# Patient Record
Sex: Male | Born: 1940 | Race: White | Hispanic: No | Marital: Married | Smoking: Former smoker
Health system: Southern US, Community
[De-identification: ages and names within clinical notes are randomized; demographics above are authoritative.]

## PROBLEM LIST (undated history)

## (undated) DIAGNOSIS — K5790 Diverticulosis of intestine, part unspecified, without perforation or abscess without bleeding: Secondary | ICD-10-CM

## (undated) DIAGNOSIS — E119 Type 2 diabetes mellitus without complications: Secondary | ICD-10-CM

## (undated) DIAGNOSIS — I10 Essential (primary) hypertension: Secondary | ICD-10-CM

## (undated) DIAGNOSIS — I779 Disorder of arteries and arterioles, unspecified: Secondary | ICD-10-CM

## (undated) DIAGNOSIS — I2510 Atherosclerotic heart disease of native coronary artery without angina pectoris: Secondary | ICD-10-CM

## (undated) DIAGNOSIS — R519 Headache, unspecified: Secondary | ICD-10-CM

## (undated) DIAGNOSIS — I739 Peripheral vascular disease, unspecified: Secondary | ICD-10-CM

## (undated) DIAGNOSIS — N529 Male erectile dysfunction, unspecified: Secondary | ICD-10-CM

## (undated) DIAGNOSIS — D369 Benign neoplasm, unspecified site: Secondary | ICD-10-CM

## (undated) DIAGNOSIS — F17201 Nicotine dependence, unspecified, in remission: Secondary | ICD-10-CM

## (undated) DIAGNOSIS — R51 Headache: Secondary | ICD-10-CM

## (undated) DIAGNOSIS — G4700 Insomnia, unspecified: Secondary | ICD-10-CM

## (undated) DIAGNOSIS — M479 Spondylosis, unspecified: Secondary | ICD-10-CM

## (undated) DIAGNOSIS — I714 Abdominal aortic aneurysm, without rupture, unspecified: Secondary | ICD-10-CM

## (undated) DIAGNOSIS — I679 Cerebrovascular disease, unspecified: Secondary | ICD-10-CM

## (undated) DIAGNOSIS — I6529 Occlusion and stenosis of unspecified carotid artery: Secondary | ICD-10-CM

## (undated) DIAGNOSIS — G629 Polyneuropathy, unspecified: Secondary | ICD-10-CM

## (undated) DIAGNOSIS — E785 Hyperlipidemia, unspecified: Secondary | ICD-10-CM

## (undated) DIAGNOSIS — I639 Cerebral infarction, unspecified: Secondary | ICD-10-CM

## (undated) HISTORY — DX: Atherosclerotic heart disease of native coronary artery without angina pectoris: I25.10

## (undated) HISTORY — DX: Cerebrovascular disease, unspecified: I67.9

## (undated) HISTORY — DX: Type 2 diabetes mellitus without complications: E11.9

## (undated) HISTORY — DX: Polyneuropathy, unspecified: G62.9

## (undated) HISTORY — DX: Peripheral vascular disease, unspecified: I73.9

## (undated) HISTORY — DX: Disorder of arteries and arterioles, unspecified: I77.9

## (undated) HISTORY — DX: Hyperlipidemia, unspecified: E78.5

## (undated) HISTORY — DX: Insomnia, unspecified: G47.00

## (undated) HISTORY — DX: Male erectile dysfunction, unspecified: N52.9

## (undated) HISTORY — DX: Headache: R51

## (undated) HISTORY — DX: Occlusion and stenosis of unspecified carotid artery: I65.29

## (undated) HISTORY — DX: Nicotine dependence, unspecified, in remission: F17.201

## (undated) HISTORY — DX: Abdominal aortic aneurysm, without rupture: I71.4

## (undated) HISTORY — DX: Benign neoplasm, unspecified site: D36.9

## (undated) HISTORY — DX: Abdominal aortic aneurysm, without rupture, unspecified: I71.40

## (undated) HISTORY — DX: Essential (primary) hypertension: I10

## (undated) HISTORY — DX: Cerebral infarction, unspecified: I63.9

## (undated) HISTORY — DX: Diverticulosis of intestine, part unspecified, without perforation or abscess without bleeding: K57.90

## (undated) HISTORY — DX: Headache, unspecified: R51.9

## (undated) HISTORY — DX: Spondylosis, unspecified: M47.9

---

## 1983-05-15 HISTORY — PX: LUMBAR SPINE SURGERY: SHX701

## 2002-03-11 ENCOUNTER — Encounter: Payer: Self-pay | Admitting: *Deleted

## 2002-03-11 ENCOUNTER — Emergency Department (HOSPITAL_COMMUNITY): Admission: EM | Admit: 2002-03-11 | Discharge: 2002-03-11 | Payer: Self-pay | Admitting: Internal Medicine

## 2003-05-15 HISTORY — PX: ANTERIOR FUSION CERVICAL SPINE: SUR626

## 2003-05-16 ENCOUNTER — Ambulatory Visit (HOSPITAL_COMMUNITY): Admission: RE | Admit: 2003-05-16 | Discharge: 2003-05-16 | Payer: Self-pay | Admitting: Neurology

## 2003-06-28 ENCOUNTER — Ambulatory Visit (HOSPITAL_COMMUNITY): Admission: RE | Admit: 2003-06-28 | Discharge: 2003-06-28 | Payer: Self-pay | Admitting: Internal Medicine

## 2003-07-01 ENCOUNTER — Ambulatory Visit (HOSPITAL_COMMUNITY): Admission: RE | Admit: 2003-07-01 | Discharge: 2003-07-01 | Payer: Self-pay | Admitting: Vascular Surgery

## 2003-12-01 ENCOUNTER — Other Ambulatory Visit: Admission: RE | Admit: 2003-12-01 | Discharge: 2003-12-01 | Payer: Self-pay | Admitting: Dermatology

## 2003-12-09 ENCOUNTER — Ambulatory Visit (HOSPITAL_COMMUNITY): Admission: RE | Admit: 2003-12-09 | Discharge: 2003-12-09 | Payer: Self-pay | Admitting: Family Medicine

## 2004-02-21 ENCOUNTER — Ambulatory Visit (HOSPITAL_COMMUNITY): Admission: RE | Admit: 2004-02-21 | Discharge: 2004-02-22 | Payer: Self-pay | Admitting: Neurosurgery

## 2004-05-14 DIAGNOSIS — I2510 Atherosclerotic heart disease of native coronary artery without angina pectoris: Secondary | ICD-10-CM

## 2004-05-14 HISTORY — DX: Atherosclerotic heart disease of native coronary artery without angina pectoris: I25.10

## 2004-05-14 HISTORY — PX: CORONARY ARTERY BYPASS GRAFT: SHX141

## 2004-09-26 ENCOUNTER — Encounter (HOSPITAL_COMMUNITY): Admission: RE | Admit: 2004-09-26 | Discharge: 2004-10-26 | Payer: Self-pay | Admitting: Family Medicine

## 2004-09-29 ENCOUNTER — Ambulatory Visit (HOSPITAL_COMMUNITY): Admission: RE | Admit: 2004-09-29 | Discharge: 2004-09-29 | Payer: Self-pay | Admitting: Family Medicine

## 2004-10-03 ENCOUNTER — Ambulatory Visit: Payer: Self-pay | Admitting: Cardiology

## 2004-10-04 ENCOUNTER — Ambulatory Visit (HOSPITAL_COMMUNITY): Admission: RE | Admit: 2004-10-04 | Discharge: 2004-10-04 | Payer: Self-pay | Admitting: Cardiology

## 2004-10-04 ENCOUNTER — Ambulatory Visit: Payer: Self-pay | Admitting: Cardiology

## 2004-10-10 ENCOUNTER — Ambulatory Visit: Payer: Self-pay | Admitting: Cardiology

## 2004-10-10 ENCOUNTER — Inpatient Hospital Stay (HOSPITAL_BASED_OUTPATIENT_CLINIC_OR_DEPARTMENT_OTHER): Admission: RE | Admit: 2004-10-10 | Discharge: 2004-10-10 | Payer: Self-pay | Admitting: Cardiology

## 2004-10-13 ENCOUNTER — Inpatient Hospital Stay (HOSPITAL_COMMUNITY): Admission: AD | Admit: 2004-10-13 | Discharge: 2004-10-14 | Payer: Self-pay | Admitting: Cardiology

## 2004-10-20 ENCOUNTER — Inpatient Hospital Stay (HOSPITAL_COMMUNITY)
Admission: RE | Admit: 2004-10-20 | Discharge: 2004-10-24 | Payer: Self-pay | Admitting: Thoracic Surgery (Cardiothoracic Vascular Surgery)

## 2004-11-07 ENCOUNTER — Ambulatory Visit (HOSPITAL_COMMUNITY): Admission: RE | Admit: 2004-11-07 | Discharge: 2004-11-07 | Payer: Self-pay | Admitting: *Deleted

## 2004-11-07 ENCOUNTER — Ambulatory Visit: Payer: Self-pay | Admitting: *Deleted

## 2004-12-04 ENCOUNTER — Encounter (HOSPITAL_COMMUNITY): Admission: RE | Admit: 2004-12-04 | Discharge: 2005-01-03 | Payer: Self-pay | Admitting: Cardiology

## 2005-01-05 ENCOUNTER — Encounter (HOSPITAL_COMMUNITY): Admission: RE | Admit: 2005-01-05 | Discharge: 2005-02-04 | Payer: Self-pay | Admitting: Cardiology

## 2005-02-05 ENCOUNTER — Ambulatory Visit: Payer: Self-pay | Admitting: Cardiology

## 2005-02-05 ENCOUNTER — Encounter (HOSPITAL_COMMUNITY): Admission: RE | Admit: 2005-02-05 | Discharge: 2005-02-09 | Payer: Self-pay | Admitting: Cardiology

## 2005-02-12 ENCOUNTER — Encounter (HOSPITAL_COMMUNITY): Admission: RE | Admit: 2005-02-12 | Discharge: 2005-03-14 | Payer: Self-pay | Admitting: Cardiology

## 2005-05-14 DIAGNOSIS — I679 Cerebrovascular disease, unspecified: Secondary | ICD-10-CM

## 2005-05-14 HISTORY — DX: Cerebrovascular disease, unspecified: I67.9

## 2005-07-10 ENCOUNTER — Encounter: Payer: Self-pay | Admitting: Vascular Surgery

## 2005-07-19 ENCOUNTER — Ambulatory Visit (HOSPITAL_COMMUNITY): Admission: RE | Admit: 2005-07-19 | Discharge: 2005-07-19 | Payer: Self-pay | Admitting: Vascular Surgery

## 2005-08-02 ENCOUNTER — Observation Stay (HOSPITAL_COMMUNITY): Admission: RE | Admit: 2005-08-02 | Discharge: 2005-08-03 | Payer: Self-pay | Admitting: Vascular Surgery

## 2006-02-13 IMAGING — CR DG CHEST 2V
2 series · 2 of 2 positions shown · non-contrast
Comparison: Two view chest x-ray 10/04/2004 [HOSPITAL].

CLINICAL DATA: Coronary artery disease. Preoperative respiratory evaluation
prior to CABG. Long-time smoker.

CHEST - 2 VIEW  10/18/2004:

[view not recorded (1 of 2)]
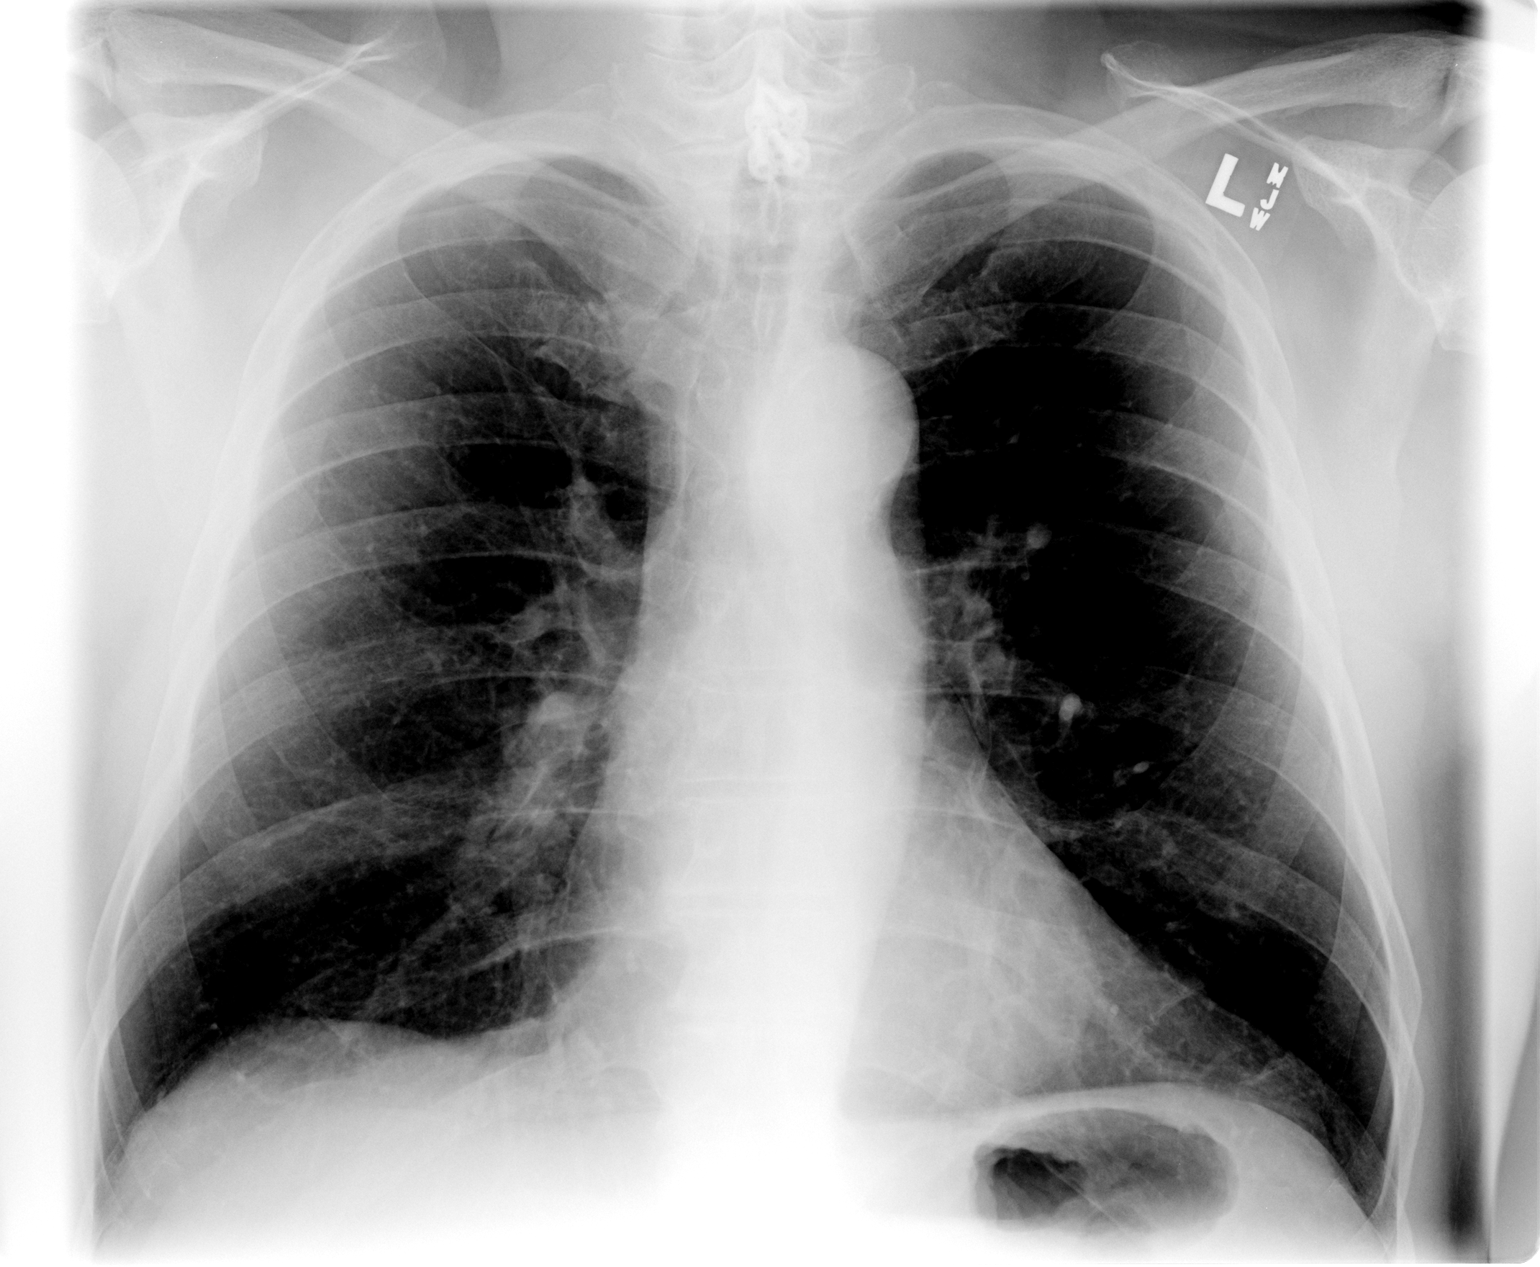

[view not recorded (2 of 2)]
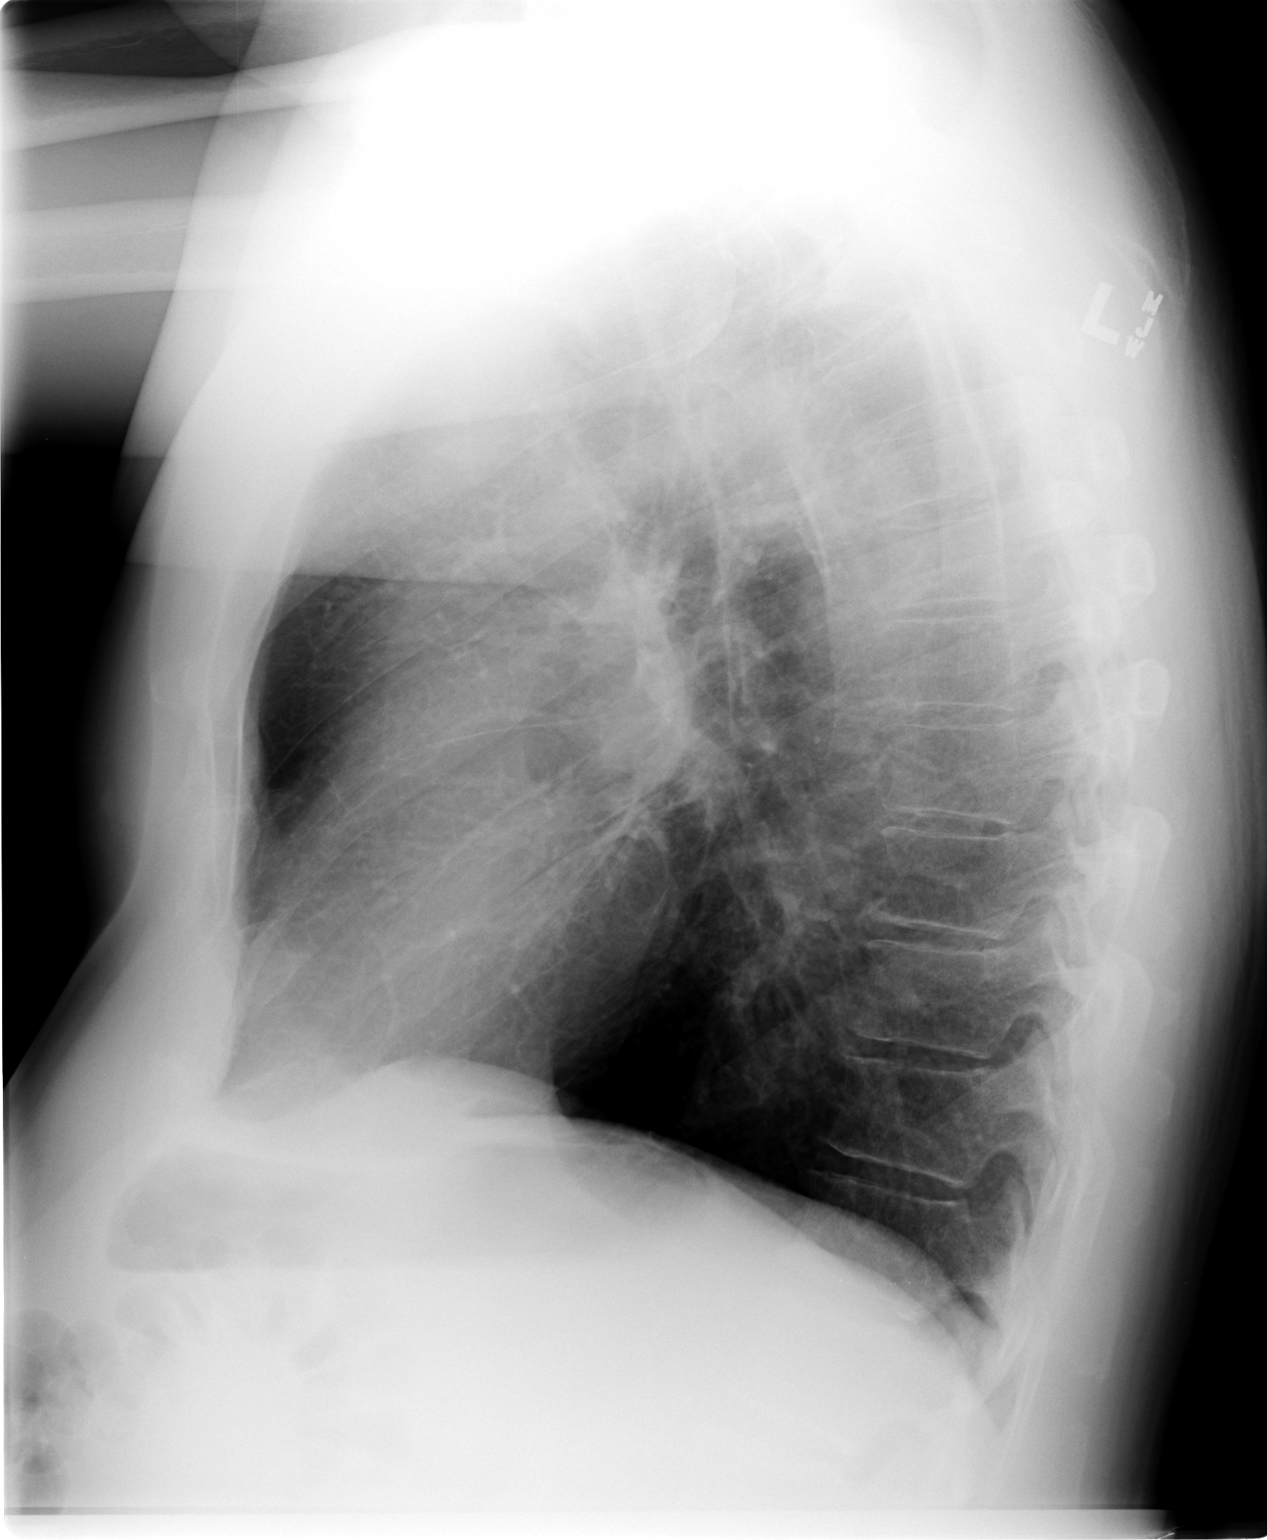

[2 of 2 positions shown; findings below may reference images not displayed]

FINDINGS: The heart size is normal and stable. The thoracic aorta is mildly
atherosclerotic. The hilar and mediastinal contours are otherwise unremarkable.
The lungs are mildly hyperinflated with prominent bronchovascular markings
diffusely and central peribronchial thickening in a pattern consistent with
chronic bronchitis, unchanged. The lungs appear clear otherwise. There are no
pleural effusions. Mild degenerative changes are present in the thoracic spine.
IMPRESSION: COPD. No evidence of acute disease.

## 2006-03-05 IMAGING — CR DG CHEST 2V
2 series · 2 of 2 positions shown · non-contrast
Comparison: none

CLINICAL DATA: Status post coronary artery bypass grafts.
 X1PZE-3 VIEW:
 PA and lateral views of the chest are made and are compared to previous study of 10/23/04 and show significant improvement in basilar aeration bilaterally.  There remains some mild atelectasis at the right base. There are again noted the coronary artery bypass grafts and wire sutures in the sternum.  There also has been a previous anterior fusion of the lower cervical spine.
 Heart is within normal limits. The aorta is minimally elongated and calcified.

[view not recorded (1 of 2)]
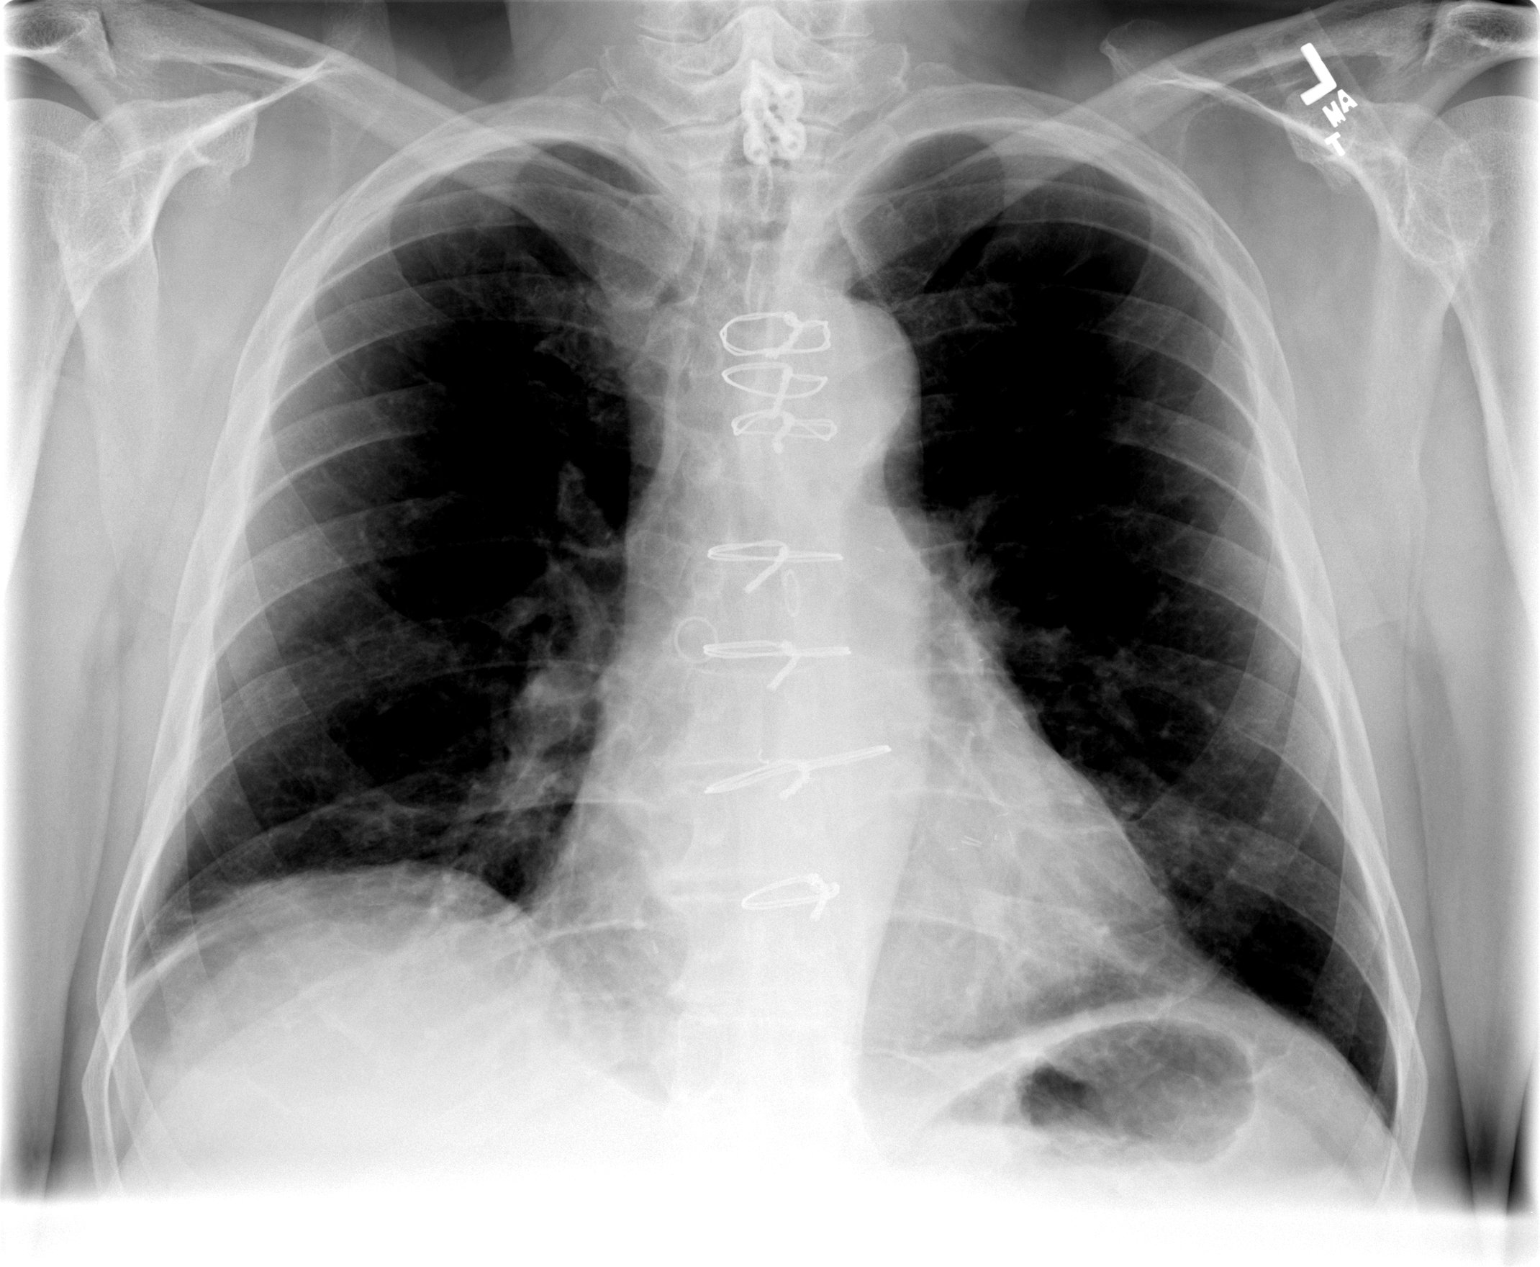

[view not recorded (2 of 2)]
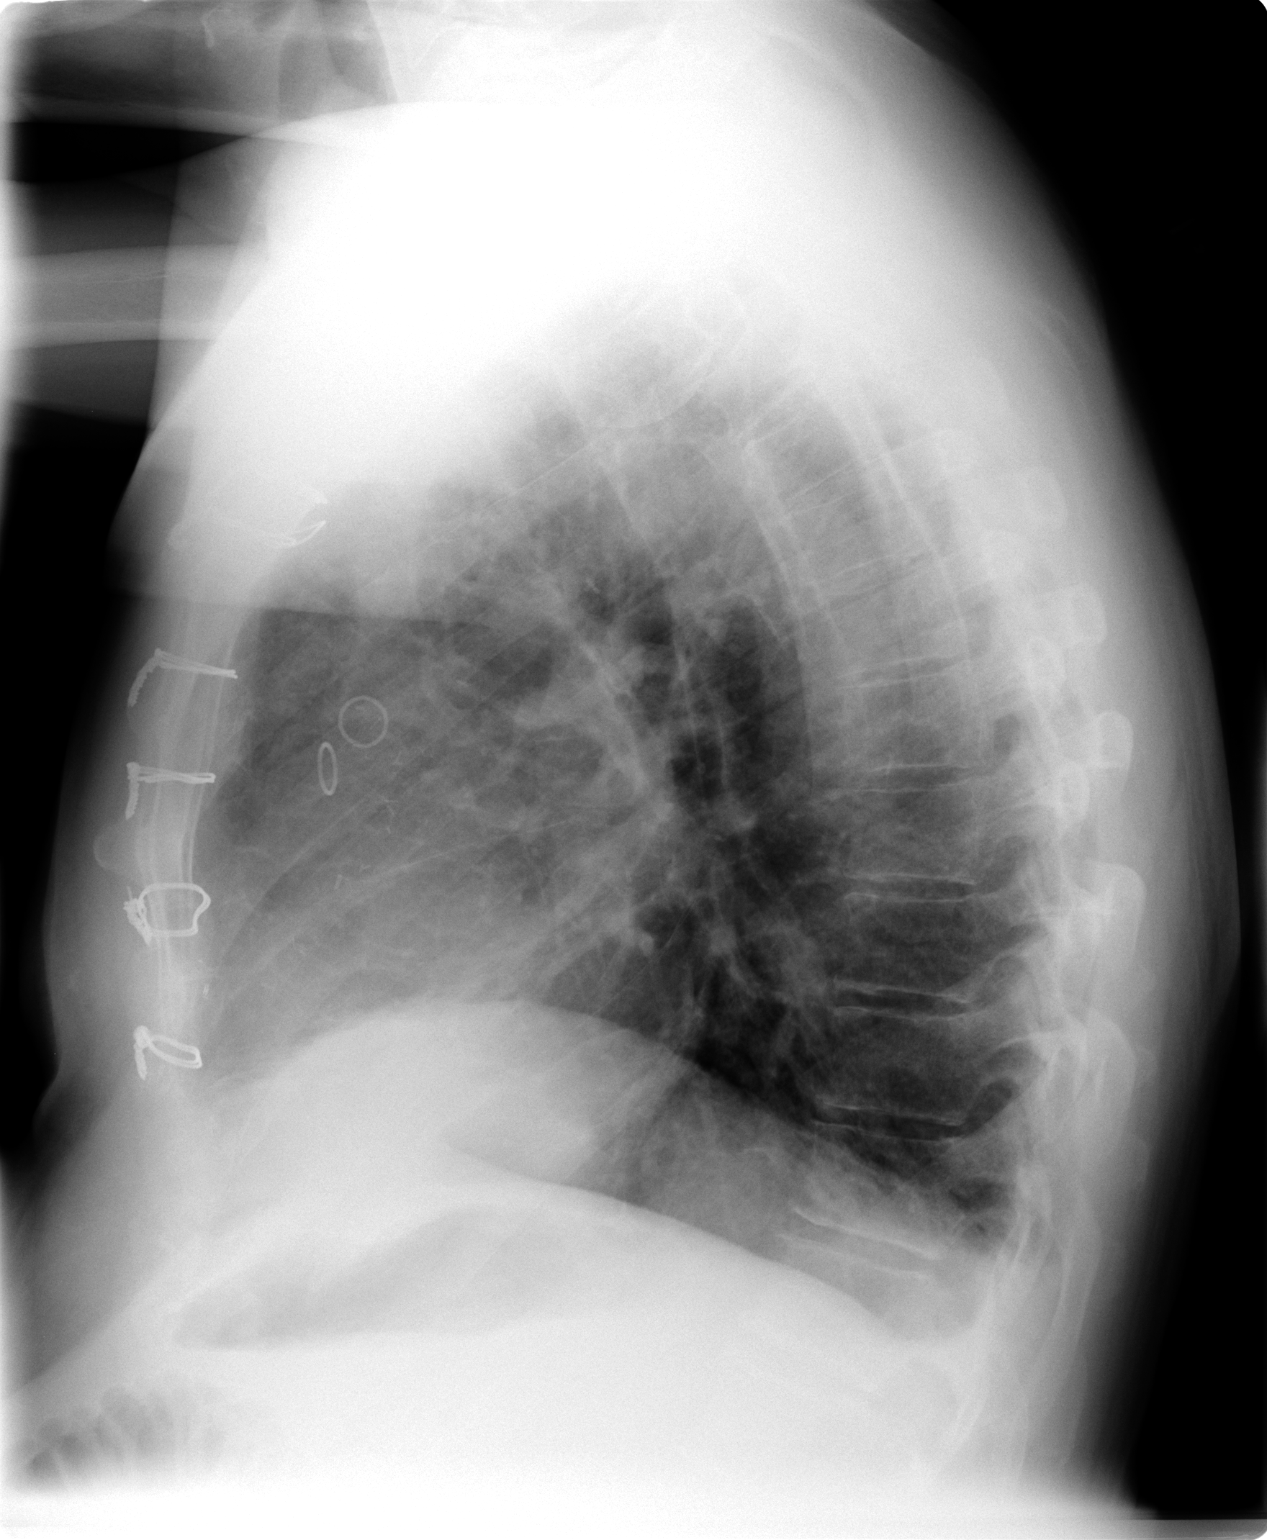

[2 of 2 positions shown; findings below may reference images not displayed]

IMPRESSION: Status post coronary artery bypass grafts.  Improved aeration both bases with some atelectasis or scarring remaining on the right.

## 2006-03-21 ENCOUNTER — Ambulatory Visit (HOSPITAL_COMMUNITY): Admission: RE | Admit: 2006-03-21 | Discharge: 2006-03-21 | Payer: Self-pay | Admitting: Family Medicine

## 2006-05-14 DIAGNOSIS — G459 Transient cerebral ischemic attack, unspecified: Secondary | ICD-10-CM

## 2006-05-14 HISTORY — DX: Transient cerebral ischemic attack, unspecified: G45.9

## 2006-11-11 ENCOUNTER — Ambulatory Visit (HOSPITAL_COMMUNITY): Admission: RE | Admit: 2006-11-11 | Discharge: 2006-11-11 | Payer: Self-pay | Admitting: Family Medicine

## 2007-01-19 ENCOUNTER — Other Ambulatory Visit: Payer: Self-pay | Admitting: Neurology

## 2007-01-19 ENCOUNTER — Inpatient Hospital Stay (HOSPITAL_COMMUNITY): Admission: EM | Admit: 2007-01-19 | Discharge: 2007-01-21 | Payer: Self-pay | Admitting: Emergency Medicine

## 2007-01-19 ENCOUNTER — Encounter: Payer: Self-pay | Admitting: Internal Medicine

## 2007-01-19 ENCOUNTER — Ambulatory Visit: Payer: Self-pay | Admitting: Cardiology

## 2007-01-19 ENCOUNTER — Ambulatory Visit: Payer: Self-pay | Admitting: Vascular Surgery

## 2007-01-20 ENCOUNTER — Encounter (INDEPENDENT_AMBULATORY_CARE_PROVIDER_SITE_OTHER): Payer: Self-pay | Admitting: Neurology

## 2007-01-20 ENCOUNTER — Encounter: Payer: Self-pay | Admitting: Internal Medicine

## 2007-02-13 ENCOUNTER — Ambulatory Visit: Payer: Self-pay | Admitting: Cardiology

## 2007-06-25 ENCOUNTER — Ambulatory Visit (HOSPITAL_COMMUNITY): Admission: RE | Admit: 2007-06-25 | Discharge: 2007-06-25 | Payer: Self-pay | Admitting: Family Medicine

## 2008-05-14 HISTORY — PX: COLONOSCOPY W/ POLYPECTOMY: SHX1380

## 2008-07-09 ENCOUNTER — Ambulatory Visit: Payer: Self-pay | Admitting: Internal Medicine

## 2008-07-09 ENCOUNTER — Encounter: Payer: Self-pay | Admitting: Gastroenterology

## 2008-07-19 ENCOUNTER — Encounter: Payer: Self-pay | Admitting: Internal Medicine

## 2008-07-21 ENCOUNTER — Encounter: Payer: Self-pay | Admitting: Internal Medicine

## 2008-07-27 ENCOUNTER — Ambulatory Visit: Payer: Self-pay | Admitting: Internal Medicine

## 2008-07-27 ENCOUNTER — Ambulatory Visit (HOSPITAL_COMMUNITY): Admission: RE | Admit: 2008-07-27 | Discharge: 2008-07-27 | Payer: Self-pay | Admitting: Internal Medicine

## 2008-07-27 ENCOUNTER — Encounter: Payer: Self-pay | Admitting: Internal Medicine

## 2008-07-28 ENCOUNTER — Encounter: Payer: Self-pay | Admitting: Internal Medicine

## 2009-04-22 ENCOUNTER — Encounter: Admission: RE | Admit: 2009-04-22 | Discharge: 2009-04-22 | Payer: Self-pay | Admitting: Neurosurgery

## 2009-05-14 DIAGNOSIS — M479 Spondylosis, unspecified: Secondary | ICD-10-CM

## 2009-05-14 HISTORY — DX: Spondylosis, unspecified: M47.9

## 2009-05-26 ENCOUNTER — Encounter: Payer: Self-pay | Admitting: Cardiology

## 2009-08-19 ENCOUNTER — Encounter: Payer: Self-pay | Admitting: Cardiology

## 2009-08-26 ENCOUNTER — Emergency Department (HOSPITAL_COMMUNITY): Admission: EM | Admit: 2009-08-26 | Discharge: 2009-08-26 | Payer: Self-pay | Admitting: Emergency Medicine

## 2009-09-23 ENCOUNTER — Encounter: Payer: Self-pay | Admitting: Cardiology

## 2010-01-31 ENCOUNTER — Ambulatory Visit: Payer: Self-pay | Admitting: Cardiology

## 2010-01-31 DIAGNOSIS — I714 Abdominal aortic aneurysm, without rupture: Secondary | ICD-10-CM | POA: Insufficient documentation

## 2010-01-31 DIAGNOSIS — E119 Type 2 diabetes mellitus without complications: Secondary | ICD-10-CM | POA: Insufficient documentation

## 2010-01-31 DIAGNOSIS — I739 Peripheral vascular disease, unspecified: Secondary | ICD-10-CM | POA: Insufficient documentation

## 2010-01-31 HISTORY — DX: Type 2 diabetes mellitus without complications: E11.9

## 2010-02-06 ENCOUNTER — Ambulatory Visit (HOSPITAL_COMMUNITY): Admission: RE | Admit: 2010-02-06 | Discharge: 2010-02-06 | Payer: Self-pay | Admitting: Cardiology

## 2010-02-09 ENCOUNTER — Encounter (INDEPENDENT_AMBULATORY_CARE_PROVIDER_SITE_OTHER): Payer: Self-pay | Admitting: *Deleted

## 2010-03-02 ENCOUNTER — Inpatient Hospital Stay (HOSPITAL_COMMUNITY): Admission: RE | Admit: 2010-03-02 | Discharge: 2010-03-03 | Payer: Self-pay | Admitting: Neurosurgery

## 2010-05-10 ENCOUNTER — Encounter: Payer: Self-pay | Admitting: Cardiology

## 2010-06-04 ENCOUNTER — Encounter: Payer: Self-pay | Admitting: Family Medicine

## 2010-06-11 LAB — CONVERTED CEMR LAB
ALT: 14 units/L
ALT: 16 units/L
AST: 17 units/L
AST: 17 units/L
Albumin: 4.2 g/dL
Albumin: 4.4 g/dL
Alkaline Phosphatase: 77 units/L
Alkaline Phosphatase: 89 units/L
BUN: 18 mg/dL
BUN: 19 mg/dL
BUN: 21 mg/dL
Bilirubin, Direct: 0.1 mg/dL
Bilirubin, Direct: 0.1 mg/dL
CO2: 24 meq/L
CO2: 25 meq/L
CO2: 27 meq/L
Calcium: 9.3 mg/dL
Calcium: 9.5 mg/dL
Calcium: 9.7 mg/dL
Chloride: 100 meq/L
Chloride: 103 meq/L
Chloride: 105 meq/L
Cholesterol: 101 mg/dL
Cholesterol: 170 mg/dL
Creatinine, Ser: 1.15 mg/dL
Creatinine, Ser: 1.2 mg/dL
Creatinine, Ser: 1.3 mg/dL
Glucose, Bld: 86 mg/dL
Glucose, Bld: 91 mg/dL
Glucose, Bld: 94 mg/dL
HCT: 35.2 %
HDL: 38 mg/dL
HDL: 42 mg/dL
Hemoglobin: 11.8 g/dL
LDL Cholesterol: 46 mg/dL
LDL Cholesterol: 90 mg/dL
MCV: 93.6 fL
PSA: 0.43 ng/mL
Platelets: 329 10*3/uL
Potassium: 4.9 meq/L
Potassium: 5.1 meq/L
Potassium: 5.1 meq/L
Sodium: 136 meq/L
Sodium: 136 meq/L
Sodium: 138 meq/L
Total Protein: 6.7 g/dL
Total Protein: 7.3 g/dL
Triglycerides: 192 mg/dL
Triglycerides: 86 mg/dL
WBC: 7.9 10*3/uL

## 2010-06-13 NOTE — Letter (Signed)
Summary: progress note DR Lovell Sheehan 08-26-09  progress note DR Lovell Sheehan 08-26-09   Imported By: Faythe Ghee 09/23/2009 09:52:48  _____________________________________________________________________  External Attachment:    Type:   Image     Comment:   External Document

## 2010-06-13 NOTE — Assessment & Plan Note (Signed)
Summary: RESCHEDULED FROM 01/19/10 DUE TO NOT HAVING CHART/TG  Medications Added SIMVASTATIN 80 MG TABS (SIMVASTATIN) take 1 tab daily ACTOS 45 MG TABS (PIOGLITAZONE HCL) take 1 tab daily NORTRIPTYLINE HCL 25 MG CAPS (NORTRIPTYLINE HCL) take 1 tab at bedtime ALPRAZOLAM 0.5 MG TABS (ALPRAZOLAM) take as needed TEMAZEPAM 30 MG CAPS (TEMAZEPAM) take 1 tab daily METOPROLOL SUCCINATE 50 MG XR24H-TAB (METOPROLOL SUCCINATE) Take one tablet by mouth daily      Allergies Added: NKDA  Visit Type:  Pre-op Evaluation Referring Provider:  Neurosurgery-Dr. Tressie Huynh; Peripheral Vascular-[name]; GI-[name] Primary Provider:  Drucilla Huynh   History of Present Illness: [name] returns to the office at the request of Dr. Lovell Huynh for preop clearance prior to planned cervical spine surgery.  Patient has done extremely well since I last saw him approximately 5 years ago and unfortunately, he has been lost to our followup since that time, but has received excellent care from his primary care physician, Dr. Gerda Huynh.  Hypertension and hyperlipidemia have remained under excellent control.  He has had no chest discomfort, exertional dyspnea, orthopnea, PND, lightheadedness nor syncope.  He notes no pedal edema.  He has long-standing claudication at approximately 200-300 yards, but has been told by his vascular surgeon that there are no revascularization options.  Despite this, he walks 30 minutes per day, stopping as needed to allow discomfort to resolve.  Pt is participating in IRIS (insulin resistance intervention after stroke) study of placebo vs actos.  Current Medications (verified): 1)  Plavix 75 Mg Tabs (Clopidogrel Bisulfate) .... Take 1 Tab Daily 2)  Lisinopril 10 Mg Tabs (Lisinopril) .... Take 1 Tab Daily 3)  Fish Oil 1000 Mg Caps (Omega-3 Fatty Acids) .... Take 1 Tab Daily 4)  Daily Multi  Tabs (Multiple Vitamins-Minerals) .... Take 1 Tab Daily 5)  Simvastatin 80 Mg Tabs (Simvastatin) ....  Take 1 Tab Daily 6)  Actos 45 Mg Tabs (Pioglitazone Hcl) .... Take 1 Tab Daily 7)  Nortriptyline Hcl 25 Mg Caps (Nortriptyline Hcl) .... Take 1 Tab At Bedtime 8)  Alprazolam 0.5 Mg Tabs (Alprazolam) .... Take As Needed 9)  Temazepam 30 Mg Caps (Temazepam) .... Take 1 Tab Daily 10)  Metoprolol Succinate 50 Mg Xr24h-Tab (Metoprolol Succinate) .... Take One Tablet By Mouth Daily  Allergies (verified): No Known Drug Allergies  Past History:  Past Surgical History: Last updated: February 27, 2010 CABG-2006: LIMA to LAD; SVG to marginal; SVG to left posterolateral; nondominant RCA. Colonoscopy and polypectomy-2010 Anterior cervical fusion-2005 Lumbosacral spine fusion-1985  Family History: Last updated: 2010/02/27 Father:deceased due to complications of stroke age 21 Mother:deceased due to blood clot from hip fracture age 41 Siblings: his brother is deceased due colon cancer- age 38  Social History: Last updated: February 27, 2010 Retired  Married  Tobacco Use - 50 pack years; quit in 2005 Alcohol Use - no Regular Exercise - no Drug Use - no  Past Medical History: ASCVD: CABG surgery in 2006; normal EF Cerebrovascular disease-history of TIA in 2008; Duplex-60-80% right vertebral artery stenosis;      moderate ASVD of the ICAs PVD-left femoral artery stent; bilateral renal artery stents-2007; ABIs-normal right; 0.80 on the left in 5/06;      angiography in 2007-95% bilateral renal; diffuse atherosclerosis of the aortoiliac system with ulceration;       severely diseased left internal iliac; near-100% left superficial femoral; severe bilateral tibial disease Hypertension-LVH Hyperlipidemia Tobacco abuse: 50 pack years; quit in 2005 Abdominal aortic aneurysm: 3.5 cm in 2011 Adenomatous polyps Diverticulosis 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    Comparison: 03/21/2006    Findings: 1.8 cm hypodensity in the dome of the right hepatic lobe   is stable.  1.4 cm lateral segment left hepatic lobe hypodense   lesion is also likely unchanged when allowing for differences in   technique.  Gallbladder, adrenal glands, spleen, pancreas are   unremarkable.  Mild bilateral perinephric stranding is noted.  The   right sided pelvic phlebolith is noted adjacent to the distal   ureter but no radiopaque renal or ureteral calculus is identified.   Asymmetric left renal atrophy noted.  No hydronephrosis.    Bilateral renal stents are in place.  Fusiform prominence of the   infrarenal abdominal aorta measures 3.5 x 2.8 cm, unchanged when   allowing negative technique.  A larger area of fusiform dilatation   more inferiorly measures 3.7 x 3.5 cm, unchanged.    There is an area of fat stranding at the anteromedial aspect of the   descending colon with an area of focal central hypodensity.  A few   borderline prominent left upper quadrant loops of small bowel are   noted in this general region with gradual tapering to normal   caliber distally.  The appendix is normal.    Bladder and seminal vesicles are unremarkable.  Lumbar spine   degenerative changes are identified.    IMPRESSION:   Mild fat stranding and central hypodensity adjacent to the   otherwise normal appearing descending colon, with imaging findings   most typical for epiploic appendagitis.  No free air. This likely   accounts for the patient's pain.    Stable infrarenal abdominal aortic aneurysm.    Read By:  Zachary Lemon,  MD  EKG  Procedure date:  01/31/2010  Findings:      Normal sinus rhythm Incomplete right bundle branch block Left anterior fascicular block vs. possible prior inferior MI Consider prior anterolateral MI vs.  delayed R-wave progression No previous tracings for comparison  -  Date:  09/23/2009    BG Random: 91    BUN: 19    Creatinine: 1.15    Sodium: 136    Potassium: 5.1    Chloride: 100    CO2 Total: 27  Date:  02/26/2005    BG Random: 86    BUN: 21    Creatinine: 1.3    Sodium: 138    Potassium: 4.9    Chloride: 105    CO2 Total: 24  Date:  12/15/2004    BG Random: 94    BUN: 18    Creatinine: 1.2    Sodium: 136    Potassium: 5.1    Chloride: 103    CO2 Total: 25   Social History: Retired  Married  Tobacco Use - 50 pack years; quit in 2005 Alcohol Use - no Regular Exercise - no Drug Use - no  Review of Systems       corrective lenses required for near vision; mild cataract formation on left; some bilateral hearing loss; dental implants-no dentures; diffuse arthritic discomfort.  Other systems are reviewed and are negative.  Vital Signs:  Patient profile:   70 year old male Height:  70 inches Weight:      206 pounds BMI:     29.66 Pulse rate:   91 / minute BP sitting:   129 / 74  (right arm)  Vitals Entered By: Dreama Saa, CNA (January 31, 2010 1:58 PM)  Physical Exam  General:  Well-developed; no acute distress: Proportionate weight and height HEENT-[place]/AT; PERRL; EOM intact; conjunctiva and lids nl:  Neck-No JVD; no carotid bruits: Endocrine-No thyromegaly: Lungs-No tachypnea, clear without rales, rhonchi or wheezes: CV-normal PMI; normal S1 and S2; minimal basilar systolic murmur. Abdomen-BS normal; soft and non-tender without masses or organomegaly; no bruits; aortic pulsation not palpable MS-No deformities, cyanosis or clubbing: Neurologic-Nl cranial nerves; symmetric strength and tone: Skin- Warm, no sig. 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  With good performance status and good exercise tolerance, his risk for the proposed surgery, which is at worst a moderately stressful procedure, should be not much more than age-appropriate.  Beta blocker therapy will be initiated preoperatively and discontinued 2 weeks postoperatively.  Problem # 2:  PERIPHERAL VASCULAR DISEASE (ICD-443.9) At present, patient is satisfied with his functional status.  He is not followed routinely by vascular surgery.  Problem # 3:  HYPERLIPIDEMIA (ICD-272.4) Recent lipid profile will be obtained from primary care physician and reviewed.  Problem # 4:  HYPERTENSION, BENIGN (ICD-401.1) Blood pressure control is excellent; patient reports rare systolics above 140.  Other Orders: Carotid Duplex (Carotid Duplex)  Patient Instructions: 1)  Your physician recommends that you schedule a follow-up appointment in: 1 year 2)  Your physician has recommended you make the following change in your medication: metoprolol succinate 50mg  daily until 2 weeks after surgery then discontinue- start asprin 81mg  daily 3)  Your physician has requested that you have a carotid duplex. This test is an ultrasound of the carotid arteries in your neck. It looks at blood flow through these arteries that supply the brain with blood. Allow one hour for this exam. There are no restrictions or special instructions. Prescriptions: METOPROLOL SUCCINATE 50 MG XR24H-TAB (METOPROLOL SUCCINATE) Take one tablet by mouth daily  #30 x 1   Entered by:   Teressa Lower RN   Authorized by:   Kathlen Brunswick, MD, [facility]   Signed by:   Teressa Lower RN on 01/31/2010   Method used:   Electronically to        [facility] (retail)       [address]       [place], [place]  [address]       Ph: [phone]       Fax: [phone]   RxID:   [id number]

## 2010-06-13 NOTE — Miscellaneous (Signed)
Summary: research medicine, Dr. Milas Hock to Dr. Marlis Edelson office, Hollie Salk.  Patient is on Actos in a research study to prevent CVA.  He may hold it the day of his prep.  Please let patient know that he needs to HOLD his research medicine the day of prep but may take it after his TCS.  Do not let him know he is on actos.    Appended Document: research medicine, Dr. Milas Hock with patient's wife and gave her the instrictions about his research medication prior to his colonoscopy and after his colonoscopy.

## 2010-06-13 NOTE — Letter (Signed)
Summary: progress note  progress note   Imported By: Faythe Ghee 06/24/2009 08:32:46  _____________________________________________________________________  External Attachment:    Type:   Image     Comment:   External Document

## 2010-06-13 NOTE — Letter (Signed)
Summary: Clearance Letter  [facility]  [address] [place] [address]   Phone: [phone]  Fax: [phone]    February 09, 2010  Re:     [name] Address:   [address] [place], [place]  [address] DOB:     [birth year] MRN:     [id number]   Dear Dr. Lovell Sheehan:  Patient has widespread vascular disease including coronary disease, but has done superbly over   the past 5 years.  With good performance status and good exercise tolerance, his risk for the   proposed surgery, which is at worst a moderately stressful procedure, should be not much more   than age-appropriate.  Beta blocker therapy will be initiated preoperatively and discontinued 2 weeks   postoperatively.    Sincerely; Dr. Knollwood Bing       Sincerely,  Teressa Lower RN

## 2010-06-13 NOTE — Letter (Signed)
Summary: IRIS TRIAL   IRIS TRIAL   Imported By: Faythe Ghee 08/19/2009 10:25:20  _____________________________________________________________________  External Attachment:    Type:   Image     Comment:   External Document

## 2010-06-13 NOTE — Consult Note (Signed)
Summary: Consultation Report/ADDENDUM  Consultation Report/ADDENDUM   Imported By: Diana Eves 07/21/2008 16:43:28  _____________________________________________________________________  External Attachment:    Type:   Image     Comment:   External Document

## 2010-06-15 NOTE — Letter (Signed)
Summary: [facility] OFFICE NOTE  [facility]ICE NOTE   Imported By: Faythe Ghee 05/10/2010 10:15:00  _____________________________________________________________________  External Attachment:    Type:   Image     Comment:   External Document

## 2010-07-10 ENCOUNTER — Encounter: Payer: Self-pay | Admitting: Cardiology

## 2010-07-20 NOTE — Letter (Signed)
Summary: [facility] OFFICE NOTE  [facility] OFFICE NOTE   Imported By: Faythe Ghee 07/10/2010 10:20:23  _____________________________________________________________________  External Attachment:    Type:   Image     Comment:   External Document

## 2010-07-26 LAB — BASIC METABOLIC PANEL
BUN: 10 mg/dL (ref 6–23)
CO2: 30 mEq/L (ref 19–32)
Calcium: 9.8 mg/dL (ref 8.4–10.5)
Chloride: 103 mEq/L (ref 96–112)
Creatinine, Ser: 1 mg/dL (ref 0.4–1.5)
GFR calc Af Amer: >60 mL/min (ref >60)
GFR calc non Af Amer: >60 mL/min (ref >60)
Glucose, Bld: 91 mg/dL (ref 70–99)
Potassium: 5.2 mEq/L — ABNORMAL HIGH (ref 3.5–5.1)
Sodium: 139 mEq/L (ref 135–145)

## 2010-07-26 LAB — CBC
HCT: 45.9 % (ref 39.0–52.0)
Hemoglobin: 15.7 g/dL (ref 13.0–17.0)
MCH: 33.5 pg (ref 26.0–34.0)
MCHC: 34.2 g/dL (ref 30.0–36.0)
MCV: 98.1 fL (ref 78.0–100.0)
Platelets: 146 10*3/uL — ABNORMAL LOW (ref 150–400)
RBC: 4.68 MIL/uL (ref 4.22–5.81)
RDW: 13.1 % (ref 11.5–15.5)
WBC: 5.3 10*3/uL (ref 4.0–10.5)

## 2010-07-26 LAB — SURGICAL PCR SCREEN
MRSA, PCR: NEGATIVE
Staphylococcus aureus: NEGATIVE

## 2010-08-01 LAB — URINALYSIS, ROUTINE W REFLEX MICROSCOPIC
Bilirubin Urine: NEGATIVE
Glucose, UA: NEGATIVE mg/dL
Hgb urine dipstick: NEGATIVE
Ketones, ur: NEGATIVE mg/dL
Nitrite: NEGATIVE
Protein, ur: NEGATIVE mg/dL
Specific Gravity, Urine: 1.02 (ref 1.005–1.030)
Urobilinogen, UA: 0.2 mg/dL (ref 0.0–1.0)
pH: 6.5 (ref 5.0–8.0)

## 2010-09-26 NOTE — Discharge Summary (Signed)
NAMECHARON, AKAMINE               ACCOUNT NO.:  [id number]   MEDICAL RECORD NO.:  [id number]          PATIENT TYPE:  INP   LOCATION:  3712                         FACILITY:  [facility]   PHYSICIAN:  Gustavus Messing. Orlin Hilding, M.D.DATE OF BIRTH:  February 05, 1941   DATE OF ADMISSION:  01/19/2007  DATE OF DISCHARGE:  01/21/2007                               DISCHARGE SUMMARY   DIAGNOSIS AT TIME OF DISCHARGE.:  1. Left MCA branch infarct embolic thought to be artery to artery      emboli versus cardiac though no source found during      hospitalization.  2. Hypertension.  3. Coronary artery disease status post CABG  4. Dyslipidemia.  5. Renal artery stenosis status post stent  6. Peripheral vascular disease.  7. Thoracic aneurysm  8. 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  9. 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   MEDICINES AT TIME OF DISCHARGE:  1. Lisinopril 10 mg a day  2. Zocor 20 mg a day  3. Plavix 75 mg a day.   STUDIES PERFORMED:  1. CT of the brain on admission shows no acute abnormality  2. MRI of the brain shows multi focal areas of acute infarction left      MCA territory affecting the frontal temporal and parietal occipital      cortex with faint involvement of the underlying white matter.  Age      appropriate atrophy with early changes of small vessel disease.  3. CT angio of the head shows heavily calcified and suspected      significant flow limiting stenosis due to both hard and soft plaque      at the right internal carotid origin estimated to be  at least 75%      and likely greater.  Focal lesion of proximal left subclavian      estimated to be at 50%.  Hypoplastic and diseased right vertebral      artery which is extremely small but patent throughout the neck.  4. Carotid Doppler shows right 60-80% by high end of scale right      vertebral artery flow antegrade.  No left ICA stenosis  5. Lower extremity Dopplers were  negative  6. Transesophageal echocardiogram shows small PFO atherosclerotic      plaque at 40% but no thrombus  7. A 2-D echocardiogram likely performed though documentation not      seen, results not available, if done.  8. EKG shows sinus bradycardia, left axis deviation, inferior infarct      age undetermined, anteroseptal infarct, abnormal ECG.   LABORATORY STUDIES:  CBC normal, chemistry with glucose 148, otherwise  normal.  Coagulation studies normal. Liver function tests normal.  Cardiac enzymes negative.  Cholesterol 171, triglycerides 72, HDL 25,  LDL 106.  Urinalysis is negative.  Homocystine is negative.  Hemoglobin  A1c 5.7.  Alcohol level less than five.  Urine drug screen positive for  benzos, otherwise negative.   HISTORY OF PRESENT ILLNESS:  Mr. Conall Vangorder is a 70 year old  Caucasian male, right-handed, married male from [place], [place]  [place], who is followed by Dr. Gerda Diss for primary care needs.  The  patient states around 11 a.m. the morning of admission he had sudden  onset of inability to express verbally any of his thoughts.  He had  words in mind to say but he could not  get them out.  He was able to  make his wife aware of his deficits who at the time noted right-sided  facial droop with drooling from the right side of his mouth.  They  called EMS and within 30 minutes, the patient was brought to [facility]  emergency room and evaluated by a physician here.  The patient's  symptoms quickly resolved and he had a NIH stroke scale of zero.  He was  not a TPA candidate secondary to quick resolution.  He was admitted to  the hospital for further stroke evaluation.   HOSPITAL COURSE:  The patient did have multiple infarcts in the left MCA  area, thought to be embolic in nature.  Transesophageal echocardiogram  was performed with a small PFO seen but not thought to be the source of  his embolic stroke.  Embolic source was not identified during   hospitalization.  It is recommended that the patient have outpatient  bubble and emboli monitoring as well as CardioNet monitoring at time of  discharge.  The patient was placed on Plavix for secondary stroke  prevention.  At this point Dr. Marlis Edelson desire is not to have him on  aspirin in addition to that.  He needs aggressive risk factor control  including an LDL of less than 100 which is now 106.  He has been on  statin in the past and stopped  taking it secondary to what he felt was  more discomfort in his legs after talking to his friends about symptoms.  Patient is agreeable to resume medication at time of discharge.   CONDITION ON DISCHARGE:  The patient alert and oriented x3.  Speech  clear.  No aphasia.  Extraocular movements intact.  Full visual fields.  He had minimal right nasolabial fold flattening but otherwise no  neurologic deficits.  He has no drift in the arms or legs, no ataxia and  no sensory loss.  No aphasia.   DISCHARGE/PLAN:  1. Discharge home with family.  2. Plavix for secondary stroke prevention.  3. Outpatient CardioNet monitoring through [facility]  4. Outpatient TCD and emboli monitoring with Dr. Pearlean Brownie. He is to call      to make an appointment  5. Follow-up up with primary care physician in 1 month for risk factor      control.      Annie Main, N.P.      Catherine A. Orlin Hilding, M.D.  Electronically Signed    SB/MEDQ  D:  01/21/2007  T:  01/21/2007  Job:  [id number]   cc:   Pramod P. Pearlean Brownie, MD  Gerrit Friends. Dietrich Pates, MD, Nena Polio, M.D.  Salvatore Decent. Cornelius Moras, M.D.

## 2010-09-26 NOTE — Op Note (Signed)
[name], MCCLURE               ACCOUNT NO.:  [id number]   MEDICAL RECORD NO.:  [id number]          PATIENT TYPE:  AMB   LOCATION:  DAY                           FACILITY:  [facility]   PHYSICIAN:  R. Roetta Sessions, M.D. DATE OF BIRTH:  Dec 16, 1940   DATE OF PROCEDURE:  07/27/2008  DATE OF DISCHARGE:                               OPERATIVE REPORT   PROCEDURE:  Esophagogastroduodenoscopy with snare polypectomy and  biopsy.   INDICATIONS FOR PROCEDURE:  A 70 year old gentleman with a family  history of colon cancer in a brother at age 76 and history of colonic  polyps personally, here for surveillance examination.  The risks,  benefits, alternatives, and limitations have been reviewed.  He has a  personal history of CVA and is on Plavix.  I am allowing him to continue  on Plavix through this procedure.  Please see documentation on the  medical record.   PROCEDURE NOTE:  O2 saturation, blood pressure, pulses, and respirations  were monitored throughout the entire procedure.  Conscious sedation with  Versed 3 mg IV and Demerol 75 mg IV in divided doses.   INSTRUMENT:  Pentax video chip system.   FINDINGS:  Digital rectal exam revealed no abnormalities.   ENDOSCOPIC FINDINGS:  Prep was adequate.   COLON:  The colonic mucosa was surveyed from the rectosigmoid junction  through the left transverse, right colon, to the area of the appendiceal  orifice, the ileocecal valve, and cecum.  These structures were well  seen and photographed for the record.  From this level, the scope was  slowly and cautiously withdrawn.  All previously mentioned mucosal  surfaces were again seen.  Patient had multiple colonic polyps.  There  was 1 at the hepatic flexure, which was cold-snared.  There were 2  diminutive polyps on the ileocecal valve, which were cold-biopsied.  There were multiple polyps of the descending colon which were hot-  snared.  The remainder of the colonic mucosa appeared normal.  The  scope  was pulled down to the rectum where a thorough examination of the rectal  mucosa, including retroflexion of the anal verge, demonstrated no  abnormalities.  The patient tolerated the procedure well and was  reactive to endoscopy.  Cecal withdrawal time was 16 minutes.   IMPRESSION:  1. Normal rectum.  2. Left-sided diverticulum.  3. Multiple colonic polyps removed, as described above.   RECOMMENDATIONS:  1. No aspirin or arthritis medications for the next 5 days.  Will have      him continue Plavix.  2. Standard instructions given.  3. Follow up on path.  4. Further recommendations to follow.      Jonathon Bellows, M.D.  Electronically Signed     RMR/MEDQ  D:  07/27/2008  T:  07/27/2008  Job:  [id number]   cc:   Dr. Lubertha South

## 2010-09-26 NOTE — H&P (Signed)
NAMEZARIAN, COLPITTS               ACCOUNT NO.:  [id number]   MEDICAL RECORD NO.:  [id number]          PATIENT TYPE:  AMB   LOCATION:  DAY                           FACILITY:  [facility]   PHYSICIAN:  R. Roetta Sessions, M.D. DATE OF BIRTH:  29-Apr-1941   DATE OF ADMISSION:  DATE OF DISCHARGE:  LH                              HISTORY & PHYSICAL   CHIEF COMPLAINT:  Time for colonoscopy.   HISTORY OF PRESENT ILLNESS:  The patient is a pleasant 70 year old  gentleman who presents back today to consider a high risk screening  colonoscopy.  He has a history of  polyps, but were hyperplastic back in  2005.  He had an EGD with a marginal prep which showed:  Left-sided  diverticula, pedunculated polyps at 30 cm, at 6 mm, and 3 mm. At 25 cm,  he also had a 1 cm or so focal area of inflamed mucosa.  Biopsies from  both of these areas were again negative.  He has a family history of a  brother who succumbed to colon cancer at age 7.  Therefore, he is at  increased risk for colon cancer himself.  He has been asymptomatic.  He  is having regular bowel movements.  No blood in the stool or melena.  No  abdominal pain, nausea, vomiting, heartburn, dysphagia, or odynophagia.   CURRENT MEDICATIONS:  1. Plavix 75 mg daily.  2. Lisinopril daily.  3. Simvastatin daily.  4. Fish oil daily.  5. Multivitamin daily.  6. Sleeping pill p.r.n.   He tells me, he is also on a research medication to try to prevent  further strokes, but tells me that it is a diabetic medication.  We have  called his neurologist trying to get some more information, but they  have not returned the call as of yet.   ALLERGIES:  No known drug allergies.   PAST MEDICAL HISTORY:  1. Hypertension.  2. Hypercholesterolemia.  3. History of CAD status post 3-vessel bypass in 2006.  4. History of peripheral vascular disease status post left femoral      artery stenting, bilateral renal artery stenting.  5. History of thoracic aortic  aneurysm.  6. Colonoscopy in 2005 as above.  7. History of stroke, one around 2005, and again in 2008, but no      residual deficits.   FAMILY HISTORY:  Brother succumbed to colon cancer, age 70.  Mother died  at age 68 after MVA with hip fracture at which time she had a blood  clot.  Father died with stroke, age 36.   SOCIAL HISTORY:  He is married, 3 children.  He is retired.  Nonsmoker.  No alcohol use.   REVIEW OF SYSTEMS:  GENERAL:  No unintentional weight loss.  CARDIOPULMONARY:  No chest pain, shortness of breath, palpitations, or  cough.  GENITOURINARY:  No dysuria or hematuria.   PHYSICAL EXAMINATION:  VITAL SIGNS:  Weight 213, height 5 feet 9, temp  98, blood pressure 130/82, and pulse 72.  GENERAL:  A pleasant, well-nourished, and well-developed Caucasian  gentleman in no acute distress.  SKIN:  Warm and dry.  No jaundice.  HEENT:  Sclerae nonicteric.  Oropharyngeal mucosa moist and pink.  No  lesions, erythema, or exudates.  No lymphadenopathy or thyromegaly.  CHEST:  Lungs are clear to auscultation.  CARDIAC:  Regular rate and rhythm.  No murmurs, rubs, or gallops.  ABDOMEN:  Positive bowel sounds.  Abdomen is soft, nontender, and  nondistended.  No organomegaly or masses.  No rebound or guarding.  No  abdominal bruits or hernias.  LOWER EXTREMITIES:  No edema.   IMPRESSION:  The patient is a 70 year old gentleman with a family  history significant for colon cancer in a brother at age 22, personal  history of hyperplastic polyps, last colonoscopy 5 years ago with a  marginal prep.  He presents for high risk followup colonoscopy.  Now, he  is on Plavix.  He has a history of significant peripheral vascular  disease status post multiple stents as well as history of a couple of  strokes, last one 2008.  He is on some sort of research medication to  try to prevent stroke and we are trying to find out more information  about this, but the patient assures me it is not a  blood thinner.   PLAN:  Colonoscopy with Dr. Jena Gauss in the near future.  We will keep him  on his Plavix for the procedure given his risk of recurrent stroke and  his peripheral vascular disease.  We are also trying to obtain  information about his research drug and will make decisions about that,  once we know more.  The patient is aware that if he has a large polyp  then Dr. Jena Gauss may need to have him come back off of his Plavix prior to  polypectomy, but otherwise smaller polyps can be removed without any  difficulty while on Plavix.  The patient is in agreement with this plan  and understands the reasoning.  I have discussed other risks,  alternatives, benefits with regards to, but not limited to the risk of  reaction of medication, bleeding, infection, and perforation and he is  agreeable to proceed.      Tana Coast, P.AJonathon Bellows, M.D.  Electronically Signed    LL/MEDQ  D:  07/09/2008  T:  07/10/2008  Job:  [id number]   cc:   Donna Bernard, M.D.  Fax: [phone]

## 2010-09-26 NOTE — Consult Note (Signed)
NAMERANELL, SKIBINSKI               ACCOUNT NO.:  [id number]   MEDICAL RECORD NO.:  [id number]          PATIENT TYPE:  AMB   LOCATION:  DAY                           FACILITY:  [facility]   PHYSICIAN:  R. Roetta Sessions, M.D. DATE OF BIRTH:  Apr 12, 1941   DATE OF CONSULTATION:  07/09/2008  DATE OF DISCHARGE:                                 CONSULTATION   ADDENDUM:  I spoke with Dr. Marlis Edelson office regarding Theodis Blaze.  The  patient told me he was on a research medication for stroke prevention.  There were able to tell me that the patient is on Actos.  They stated  that he could stop the medication the day of his prep given that he will  be have limited p.o. intake.  I will inform the patient of additional  instructions.      Tana Coast, P.AJonathon Bellows, M.D.  Electronically Signed    LL/MEDQ  D:  07/09/2008  T:  07/10/2008  Job:  [id number]

## 2010-09-26 NOTE — H&P (Signed)
[name], BOTTO               ACCOUNT NO.:  [id number]   MEDICAL RECORD NO.:  [id number]          PATIENT TYPE:  EMS   LOCATION:  MAJO                         FACILITY:  [facility]   PHYSICIAN:  Melvyn Novas, M.D.  DATE OF BIRTH:  04/18/1941   DATE OF ADMISSION:  01/19/2007  DATE OF DISCHARGE:  01/19/2007                              HISTORY & PHYSICAL   HISTORY OF PRESENT ILLNESS:  The patient presented to the [facility] ER  via EMS transfer.   [name] is a 70 year old Caucasian right-handed married male  living in [place], [place] followed by Dr. Lubertha South for  primary care needs.  The patient states that around 11:00 a.m. this  morning, he had a sudden onset of inability to express verbally any of  his faults.  He had the words in mind, he stated, but could not get them  out.  He was able to make his wife aware of his deficits, who at that  time noted a right-sided facial droop with drooling out of the right  side of his mouth.  She called the EMS and within 30 minutes the patient  was here at the [facility] evaluated by Dr. Radford Pax.   Originally called as a code stroke, the code stroke was counseled as the  patient presented with an NIH of zero and had no subjective or objective  symptoms remaining at the time of his evaluation.   SOCIAL HISTORY:  [name] is a former smoker with a longstanding  nicotine use history, who is married, lives with his wife, has adult  children and grandchildren.   PAST MEDICAL HISTORY:  1. Hypertension.  2. Thoracic aneurysm.  3. Coronary artery disease, three-vessel which has been followed by      Dr. Barry Dienes at [facility].  4. 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  5. Hyperlipidemia.  6. History of renal artery stenosis that was stented by the [facility]      colleagues .  7. Peripheral vascular disease.  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   CARDIOLOGIST:  Dr. Kersey Bing with [facility].   REVIEW OF SYSTEMS:  At this time, the patient is symptom free, but he is  as described as I dictated above symptoms of aphagia, and was himself  not able to feel the right sided facial droop.  He could not appreciate  any objective body weakness, numbness, disequilibrium and had no  problems with swallowing he states.   ALLERGIES:  None.   MEDICATIONS:  1. The patient is supposed to take lisinopril 10 mg a day.  2. Aspirin, he takes two baby aspirins a day.  3. Multivitamin and fish oil.  4. He is also on Lipitor, but discontinued that by himself on the 11th      of last month.  He states he had heard that it could cause muscle      aches and pains, and he felt better being off.   ASSESSMENT:  VITAL SIGNS:  The patient has stable vital signs with a  blood pressure  of 122/80, heart rate of 65 that shows normal sinus  rhythm.  LUNGS: Clear to auscultation.  COR:  Regular rate and rhythm.  There is no carotid bruit.  No temporal  artery occlusion.  He states that he has a history of carotid artery  occlusion which I cannot appreciate at this time.  He seems to have  bilateral no bruit over the carotid and clear auscultatory pulses.  NECK:  There is no TMJ pain.  There is no neck rigidity or pain with  passive movement.  EXTREMITIES:  Range of motion for the upper extremities in normal range.  He has no bruising, no claudication, no clubbing or cyanosis.  There is  a slightly decreased capillary refill time left versus right in the  foot.  NEUROLOGICAL:  Mental status alert and oriented x3, clear speech.  No  visible aphagia or dysarthria, able to describe verbal objects, to  repeat and to name.   No apraxia, the patient has no left/right confusion.  Cranial nerve  examination shows full extraocular movements.  Pupils reactive, equal to  light and accommodation.  Visual field but bilateral stimulation are in  normal range.  No facial droop was remaining no  facial sensory loss is  appreciated.  Shoulder shrug is equal and strong on both sides.  Tongue  and uvula are midline.   Motor examination shows bilateral equal deep tendon reflexes.  Downgoing  toes to plantar stimulation.  The patient was able to elevate both lower  extremities over 5 seconds without drift, antigravity and both upper  extremities over 10 seconds without gravity.  Finger-nose test was  intact.  Heel-to-shin test was intact.  Sensory to prime modalities is  intact.  Gait and station was deferred   IMPRESSION:  The patient is suspected to have suffered a TIA given his  history of vascular disease and multiple risk factors, I initiated  heparin as he has been taking aspirin daily and seemed to have failed  aspirin.  A CT scan of the brain has not shown any evidence of a bleed.  I was informed by Dr. Radford Pax about the patient's supposed occluded  carotid artery and suggested that we should do an MRI with MRA or a CT  angio depending on his renal screen.  I was just able to call up the  patient's laboratory results and quote here, normal white blood cell  count of 6.4, H&H is 15.9/46.2.  Sodium is 133, potassium 4.9, glucose  98, BUN 15, creatinine 1.03.  GFR is estimated over 60 mL per minute.  Troponin is negative 0.02.   PLAN:  I would like to obtain a CT angio if possible.  I will ask the  colleagues from [facility] that are familiar with this gentleman to advise me.  I also will ask his [facility] cardiologist to do a 2-D echocardiogram for  [place] tomorrow.      Melvyn Novas, M.D.  Electronically Signed     CD/MEDQ  D:  01/19/2007  T:  01/20/2007  Job:  [id number]   cc:   Pramod P. Pearlean Brownie, MD  Gerrit Friends. Dietrich Pates, MD, Nena Polio, M.D.  Salvatore Decent. Cornelius Moras, M.D.

## 2010-09-29 NOTE — Op Note (Signed)
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   POSTOPERATIVE DIAGNOSIS:  Left C6-7 herniated nucleus pulposus, spinal  stenosis, cervical radiculopathy, cervicalgia, degenerative disk disease.   OPERATION PERFORMED:  C6-7 extensive anterior cervical  diskectomy/decompression; interbody iliac crest allograft arthrodesis;  anterior cervical plating (Codman Slim Lock titanium plate and screws).   SURGEON:  Cristi Loron, M.D.   ASSISTANT:  Stefani Dama, M.D.   ANESTHESIA:  General endotracheal.   ESTIMATED BLOOD LOSS:  50 mL.   SPECIMENS:  None.   DRAINS:  None.   COMPLICATIONS:  None.   INDICATIONS FOR PROCEDURE:  The patient is a 70 year old white male who has  suffered from neck and left arm pain.  He has failed medical management and  was worked up with a cervical MRI which demonstrated a herniated disk at C6-  7 on the left.  The patient's signs, symptoms and physical exam were  consistent with a left C7 radiculopathy.  I discussed the various treatment  options with him including surgery.  The patient has weighed the risks,  benefits and alternatives to surgery and decided to proceed with a C6-7  anterior cervical diskectomy, fusion and plating.   DESCRIPTION OF PROCEDURE:  The patient was brought to the operating room by  the anesthesia team.  General endotracheal anesthesia was induced.  The  patient remained in supine position.  A roll was placed under the shoulders  to place the neck in slight extension.  The  anterior cervical region was  then prepared with Betadine scrub and Betadine solution.  Sterile drapes  were applied.  I then injected the area to be incised with Marcaine with  epinephrine solution, used a scalpel to make a transverse incision in the  patient's left anterior neck.  I used Metzenbaum scissors to divide the  platysma muscle and then to dissect medial to the sternocleidomastoid  muscle, jugular vein and carotid artery.  I bluntly dissected down toward  the anterior cervical spine, carefully identifying the esophagus and  retracting it medially.  I used Kitner swabs to clear the soft tissue from  the anterior cervical spine and inserted a bent spinal needle into the upper  exposed intervertebral disk space.  We obtained intraoperative radiograph to  confirm our location.   We then used electrocautery to detach the medial border of the longus colli  muscle bilaterally from C6-7 intervertebral disk space.  We then inserted a  Caspar self-retaining retractor underneath the longus colli muscle  bilaterally for exposure.  We incised the C6-7 intervertebral disk with a 15  blade scalpel and performed a partial diskectomy using pituitary forceps and  the Carlens curet.  We inserted distraction screws into the vertebral bodies  at C6 and C7, distracted the interspace and then used a high speed drill to  decorticate vertebral end plates at U1-3, drill away the remainder of the C6-  7 intervertebral disk and thinned out the posterior longitudinal ligament  and then drilled away some posterior spondylosis.  I then incised the  thinned out ligament with the arachnoid knife and removed it with the  Kerrison punch undercutting the vertebral end plates, decompressing the  thecal sac.  I performed a generous foraminotomy about the bilateral C7  nerve roots.  On the left side we found as expected a soft tissue disk  herniation compressing the left C7 nerve root.  We removed it with  pituitary  forceps and the Kerrison punch, completing the decompression.   We now turned our attention to arthrodesis.  We obtained iliac crest  tricortical allograft bone graft and fashioned it to these approximate  dimensions.  9 mm in height, 1 cm in depth.  We inserted the bone graft into  the distracted C6-7 interspace and removed distraction screws.  There was  good snug fit of the bone graft.   We now turned our attention to spinal instrumentation.  We used the high  speed drill to drill away some ventral spondylosis from C6-7 intervertebral  disk space so that the plate lay down flat.  We selected the appropriate  length Codman Slim Lock anterior cervical plate, laid it along the anterior  aspect of the vertebral bodies of C6 and C7 __________ C6, two at C7 and  then secured the plate to the vertebral bodies by placing two 14 mm self  tapping screws in at C6 and two at C7.  We obtained intraoperative  radiograph.  It demonstrated good position of plates and upper screws.  We  could not see the lower screws because of the patient's body habitus but  they looked good in vivo.  We therefore secured the screws and the plate by  locking each cam, completing the instrumentation.   We then obtained stringent hemostasis using bipolar electrocautery.  We  copiously irrigated the wound out with bacitracin solution, removed the  solution and then removed the Caspar self-retaining retractor.  We inspected  the esophagus for any damage.  There was none apparent.  We then  reapproximated the patient's platysma muscle with interrupted 3-0 Vicryl  sutures, subcutaneous tissues with interrupted 3-0 Vicryl suture and the  skin with benzoin and Steri-Strips.  The wound was then coated with  bacitracin ointment.  A sterile dressing was applied.  The drapes were  removed.  The patient was subsequently extubated by the anesthesia team and transported to the post anesthesia care unit in stable  condition.  All  sponge, needle and instrument counts were correct at the end of this case.       JDJ/MEDQ  D:  02/21/2004  T:  02/21/2004  Job:  [id number]

## 2010-09-29 NOTE — Discharge Summary (Signed)
NAMEJERAMEY, [name]               ACCOUNT NO.:  [id number]   MEDICAL RECORD NO.:  [id number]          PATIENT TYPE:  INP   LOCATION:  3732                         FACILITY:  [facility]   PHYSICIAN:  Joellyn Rued, P.A. [facility] DATE OF BIRTH:  11/10/1940   DATE OF ADMISSION:  10/13/2004  DATE OF DISCHARGE:  10/14/2004                           DISCHARGE SUMMARY - REFERRING   DISCHARGE PHYSICIAN:  Vida Roller, MD.   SUMMARY OF HISTORY:  [name] is a 70 year old gentleman, who was  referred to Dr. Dietrich Pates on Oct 03, 2004, at the [place] office in  regards to cardiovascular evaluation.  The patient actually denies any  exertional chest discomfort but with his multiple risk factors as well as  bilateral calf discomfort representing claudication and an abnormal EKG, a  stress test was performed.  This had to be changed to a dobutamine stress.  This revealed an apical perfusion abnormality thought to represent a large  area of ischemia; thus, he was referred for cardiac catheterization.   PAST MEDICAL HISTORY:  1.  Tobacco use.  2.  Hypertension.  3.  Hyperlipidemia.  4.  Peripheral vascular disease.  5.  DJD.   LABORATORY DATA:  Chest x-ray on the 24th did not show any acute  abnormalities.  Pre-admission H&H was 15.9 and 45.6, normal indices,  platelets 225, WBC 6.1.  At the time of discharge on the 3rd, H&H was 14.9  and 42.9, normal indices, platelets 252, WBC is 6.6.  Admission PTT was 32,  PT 13.1.  Sodium 137, potassium 4.2, BUN 17, creatinine 1.1, glucose 110.  Prior to discharge, sodium is 137, potassium 3.7, BUN 16, creatinine 1.2.  EKG shows a sinus bradycardia, decreased voltage, a left axis deviation,  delayed R-wave, nonspecific ST T-wave changes.   HOSPITAL COURSE:  [name] underwent cardiac catheterization on October 13, 2004, by Dr. Samule Ohm.  Please refer to dictation.  Dr. Samule Ohm noted a long  __________ of the LAD and after reviewing with Dr. Dietrich Pates it was felt  that  the patient would benefit from bypass surgery rather than percutaneous  intervention.  He also had some other disease in the circumflex.  The RCA  was nondominant without disease.  Post-catheterization, he was ambulating  without difficulty, and on June 3rd, Dr. Andee Lineman felt that the patient could  be discharged home with outpatient followup with [facility] that has already been  arranged.   DISCHARGE DIAGNOSES:  1.  Abnormal Cardiolite and electrocardiogram.  2.  Probable peripheral vascular disease.  3.  Coronary artery disease by cardiac catheterization.  4.  History of hypertension.  5.  History of hyperlipidemia.  6.  Tobacco use.   DISPOSITION:  The patient was discharged home.  At the time of discharge, he  received instructions and information about tobacco cessation, bypass  surgery, hyperlipidemia.   NEW MEDICATIONS ON DISCHARGE:  1.  Xanax 0.25 mg t.i.d. p.r.n.  He was given no refills and 60 tablets.  2.  Nitroglycerin 0.4 mg as needed.  3.  Toprol-XL 50 mg daily.  4.  He was asked to continue aspirin  325 mg daily.  5.  Hyzaar 50/12.5 mg b.i.d.  6.  Lopid 600 mg b.i.d.  7.  Temazepam 30 mg q.h.s.  8.  He was advised to discontinue Verapamil.  9.  His Plavix was discontinued in the hospital.   SPECIAL INSTRUCTIONS:  1.  He was advised no smoking or tobacco products  2.  Return to [facility] for chest discomfort unrelieved with      nitroglycerin.   DIET:  He was asked to maintain a low-salt, fat, and cholesterol diet.   FOLLOWUP:  He was asked to keep his appointment with Dr. Cornelius Moras at [facility] on  Monday.  He was also encouraged to call our office if he had any  difficulties.  Catheterization site and restriction activities were  discussed via discharge sheet.      EW/MEDQ  D:  10/14/2004  T:  10/15/2004  Job:  [id number]   cc:   Lorin Picket A. Gerda Diss, MD  [address], [address]  [place]  [place] [address]  Fax: [phone]   [place] Bing, M.D.   Salvatore Decent. Cornelius Moras, M.D.  [address]  [place]  [place] [address]

## 2010-09-29 NOTE — Cardiovascular Report (Signed)
NAMECAELEB, BATALLA               ACCOUNT NO.:  [id number]   MEDICAL RECORD NO.:  [id number]          PATIENT TYPE:  INP   LOCATION:  3732                         FACILITY:  [facility]   PHYSICIAN:  Salvadore Farber, M.D. LHCDATE OF BIRTH:  04/16/1941   DATE OF PROCEDURE:  10/13/2004  DATE OF DISCHARGE:                              CARDIAC CATHETERIZATION   PROCEDURE PERFORMED:  Coronary angiography.   CARDIOLOGIST:  Salvadore Farber, M.D.   INDICATIONS:  Mr. Buist is a 70 year old gentleman who presented with an  abnormal electrocardiogram.  This led to stress test, which demonstrated  anterior ischemia.  This, in turn, led to diagnostic catheterization  performed on May 26th by Dr. Andee Lineman.  I reviewed those films with Dr. Andee Lineman  at that time. It appeared that there was a subtotal occlusion of the mid LAD  with bifurcation stenosis of the circumflex.  He was therefore loaded on  Plavix and returned for planned intervention today.   PROCEDURAL TECHNIQUE:  Informed consent was obtained.  Under 1% lidocaine  local anesthesia a 7 French sheath was placed in the right common femoral  artery using the modified Seldinger technique.  Anticoagulation was  initiated with bivalirudin.  ACT was confirmed to be greater than 225  seconds.  A 7 [place] CLS-4 guide was advanced over a wire and engaged in the  ostium of the left main.  Angiography was performed in multiple views by  hand injection.   To my impression of the prior films it was now clear that the LAD had a  chronic total occlusion.  To assess the length of the chronic total occlusin  I placed a 5 French sheath in the left common femoral artery using the  modified Seldinger technique.  A 5 [place] JR-4 diagnostic catheter was used  to selectively engage the RCA.  Simultaneous of both the right and left  systems was then performed. These images demonstrated that there was a very  long, greater than 35 mm, chronic total occlusion of  the mid LAD.  In  retrospect, what had appeared to be a filling of the distal LAD was, in  fact, a septal branch.  Given the length of the chronic total occlusion I  felt that the likelihood of success at percutaneous intervention was low and  that the patient would be better served by bypass operation.  The procedure  was therefore aborted.   The catheters were removed over wires.   The patient was transferred to the holding room in stable condition.  She is  to remain there.   COMPLICATIONS:  None.   FINDINGS:   ANGIOGRAPHIC DATA:  Left Main:  The left main was angiographically normal.   LAD:  The LAD is a large vessel reaching the apex of the heart and giving  rise to a single proximal diagonal.  There is a long chronic total occlusion  of the mid LAD.  There is an addition, at least, moderate stenosis of the  LAD further downstream from its reconstitution.  The distal LAD received  faint collaterals from the left and fairly  robust collaterals from the  right.   Circumflex:  The circumflex is a large dominant vessel.  There is a 90%  stenosis of the AV groove vessel after the takeoff of the large first  marginal.  There is then a 40% stenosis of the distal AV groove vessel.   IMPRESSION AND RECOMMENDATIONS:  In contrast to my assessment of prior  angiogram there is a long chronic total occlusion of the left anterior  descending.   I discussed the situation with Dr. Dietrich Pates and we feel he may be better  served with coronary artery bypass grafting than percutaneous intervention.  We will review films with Dr. Cornelius Moras of [facility].       WED/MEDQ  D:  10/13/2004  T:  10/14/2004  Job:  [id number]   cc:   Donna Bernard, M.D.  [address]  [place]  [place] [address]  Fax: [phone]   [place] Bing, M.D.

## 2010-09-29 NOTE — Discharge Summary (Signed)
[name], [name]               ACCOUNT NO.:  [id number]   MEDICAL RECORD NO.:  [id number]          PATIENT TYPE:  INP   LOCATION:  2019                         FACILITY:  [facility]   PHYSICIAN:  Salvatore Decent. Cornelius Moras, M.D. DATE OF BIRTH:  January 09, 1941   DATE OF ADMISSION:  10/20/2004  DATE OF DISCHARGE:                                 DISCHARGE SUMMARY   ADMISSION DIAGNOSIS:  Three-vessel coronary artery disease.   PAST MEDICAL HISTORY AND DISCHARGE DIAGNOSES:  1.  Hypertension.  2.  Hyperlipidemia.  3.  Long-standing tobacco abuse.  4.  Peripheral vascular disease.  5.  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  6.  Status post anterior cervical fusion approximately 6-8 months ago.  7.  Lumbosacral spine surgery approximately 20 years ago for degenerative      disk disease.  8.  Coronary artery disease status post coronary artery bypass grafting x3.  9.  Postoperative anemia, resolved.  10. Postoperative atrial fibrillation, resolved.   ALLERGIES:  NO KNOWN DRUG ALLERGIES.   BRIEF HISTORY:  The patient is a 70 year old Caucasian male is followed by  Dr. Loran Senters.  He had had no previous history of coronary artery  disease, but several risk factors including hypertension, hyperlipidemia and  heavy tobacco abuse.  The patient underwent a stress echo per Dr. Fletcher Anon  discretion and this was found to be abnormal and suggestive of possible  anterior wall ischemia.  The patient was then referred to Dr. Dietrich Pates, and  subsequently underwent elective cardiac catheterization on Oct 10, 2004.  Findings included three-vessel coronary artery disease with normal left  ventricular function.  The patient was then referred to Dr. Samule Ohm for  possible percutaneous coronary intervention; however, when he was brought in  for the procedure on October 13, 2004 and repeat cath was performed, it was  found that the patient had a long chronic occlusion of the LAD, and  therefore  percutaneous intervention was avoided.  The patient was then  referred to [facility] regarding surgical revascularization.  Dr. Cornelius Moras saw and  evaluated the patient and it was his opinion that the patient should proceed  with coronary artery bypass grafting.   HOSPITAL COURSE:  The patient was admitted and taken to the OR on October 20, 2004 for coronary artery bypass grafting x3.  Endoscopic vessel harvesting  was performed in the right lower extremity.  The left internal mammary  artery was grafted to the LAD, saphenous vein was grafted to the PL and  saphenous vein was grafted to the circumflex.  The patient tolerated the  procedure well and was hemodynamically stable immediately postoperatively.  The patient was transferred from the OR to the SICU in stable condition.  The patient was extubated without complication, woke up from anesthesia  neurologically intact.   During the evening of the operative day the patient developed atrial  fibrillation.  He was started on IV amiodarone and subsequently converted to  a normal sinus rhythm.  He has maintained a normal sinus rhythm and was  successfully switched from IV amiodarone to a p.o. version.  He continues to  maintain a normal sinus rhythm at this time.   On postoperative day one the patient felt well, and his only complaint was  mild soreness.  He was afebrile with stable vital signs.  Invasive lines and  chest tubes were discontinued in a routine manner without complication.  The  patient had some volume overload postoperatively and has been diuresed  accordingly.   The remainder of the patients postoperative course has progressed as  expected.  He was noted to have a mild postoperative anemia on postoperative  day two, with a hemoglobin and hematocrit of 9.4 and 27.  This has been  monitored and is stable at this time.  The patient began cardiac rehab on  postoperative day one and has tolerated this well.  He has increased his  tolerance  to a satisfactory level.   On postoperative day three the patient was without complaint, his bowel  function has returned, and he has maintained a normal sinus rhythm.  He is  afebrile with stable vital signs.  On physical exam -- cardiac is regular  rate and rhythm.  The lungs revealed decreased breath sounds in right base.  The abdomen is benign.  The incisions are clean, dry and intact and there is  trace edema present in the bilateral lower extremities.  The patient is in  stable condition at this time and as long as he continues to progress in the  current manner, he will be ready for discharge within the next 1-2 days  pending morning round reevaluation.   LABS:  CBC and BMP on October 23, 2004 -- white count 13.3, hemoglobin 9.9,  hematocrit 28.5, platelets 158, sodium 135, potassium 4.1, BUN 26,  creatinine 1.3, glucose 118.   CONDITION ON DISCHARGE:  Improved.   INSTRUCTIONS/MEDICATIONS:  1.  Aspirin 325 mg daily.  2.  Toprol XL 50 mg daily.  3.  Lipitor 40 mg q.h.s.  4.  Lopid 600 mg b.i.d.  5.  Lasix 40 mg daily x7 days.  6.  K-Dur 20 mEq daily x7 days.  7.  Amiodarone 200 mg b.i.d.  8.  Tylox 1-2 q.4-6h. p.r.n. pain.   ACTIVITY:  No driving, no lifting more than 10 pounds and the patient should  continue daily breathing and walking exercises.   DIET:  Low salt, low fat.   WOUND CARE:  The patient may shower daily and clean the incisions with soap  and water.  If wound problems arise, he should contact the [facility] office.   FOLLOWUP APPOINTMENT:  Dr. Dietrich Pates.  The patient will be instructed to  contact his office for an appointment two weeks after discharge.  A chest x-  ray will be taken at the time of that appointment, which will be given to  the patient to bring to the followup with Dr. Cornelius Moras.  Next, Dr. Cornelius Moras on November 27, 2004 at 12:30 p.m.       AY/MEDQ  D:  10/23/2004  T:  10/23/2004  Job:  [id number]  cc:   [place] Bing, M.D.   Scott A. Gerda Diss, MD  [address]  [place]  [place] [address]  Fax: [phone]

## 2010-09-29 NOTE — Cardiovascular Report (Signed)
[name], [name]               ACCOUNT NO.:  [id number]   MEDICAL RECORD NO.:  [id number]          PATIENT TYPE:  OIB   LOCATION:  6501                         FACILITY:  [facility]   PHYSICIAN:  Learta Codding, M.D. LHCDATE OF BIRTH:  February 10, 1941   DATE OF PROCEDURE:  10/10/2004  DATE OF DISCHARGE:  10/10/2004                              CARDIAC CATHETERIZATION   PROCEDURES PERFORMED:  1. Left heart catheterization with selective coronary angiography.  2. Ventriculography.     DIAGNOSES:  1. Severe three-vessel coronary artery disease, with subtotal occlusion of      the left anterior descending.  2. Normal left ventricular systolic function.     INDICATIONS:  The patient is an asymptomatic 70 year old male with an  abnormal electrocardiogram.  The patient underwent a dobutamine Cardiolite  stress study which showed a large ischemia burden in the anterior wall.  The  patient has referred for diagnostic catheterization to assess his coronary  anatomy.   DESCRIPTION OF PROCEDURE:  After informed consent was obtained, the patient  was brought to the catheterization laboratory.  The right groin was  sterilely prepped and draped.  A 4 French arterial sheath was placed using  the modified Seldinger technique.  Standard JL-4 and JR-4 catheters were  used for selective coronary angiography.  The pigtail catheter was used for  ventriculography.  No complications were encountered during the procedure.  At the termination of the procedure, all catheters and sheaths were removed,  and the patient was brought back to the holding area.   FINDINGS:  1. Hemodynamics.  Left ventricular pressure 128/10 mmHg.  Aortic pressure      128/69 mmHg.  2. Ventriculography.  Ejection fraction is 60%, with no segmental wall      motion abnormalities.  No definite mitral regurgitation.  3. Selective coronary angiography.  The left main coronary artery was a      large-caliber vessel with no evidence of  flow-limiting disease.  4. The left anterior descending artery was subtotally occluded in its      midsegment.  There was diffuse disease in the proximal vessel.  There      was a first septal perforator with an ostial 90% stenosis.  Proximal to      the subtotal occlusion, there are two diagonal branches, the second one      being small, and the first diagonal branch with an ostial 70% stenosis.      There is very faint filling of the mid to distal LAD.  There appears to      be faint collateral flow from the first obtuse marginal to the distal      LAD.  5. Circumflex coronary artery.  This was a large caliber vessel, with a      90% stenosis in its midsegment.  Prior to the stenosis, there was a      large obtuse marginal branch with a  70% stenosis.  The mid to distal      circumflex had a 50% stenosis, and the second obtuse marginal branch      had a  proximal 70% stenosis.  6. The right coronary artery was codominant, and there was a 90% stenosis      in the midsegment.  Following this, there was a small to moderate-size      RV branch which gave collaterals to the distal LAD.  The distal right      coronary artery has a diffuse 50% stenosis.  7. Collaterals as outlined above.  There appear to be collaterals      originating from an RV branch from the right coronary artery to the      distal LAD and faint collaterals from the first marginal branch to the      distal LAD.     CONCLUSION:  Angiographic images were reviewed with Dr. Samule Ohm.  The patient  appears to have severe multivessel disease with a large ischemia burden by  dobutamine Cardiolite stress study.  The plan is to proceed with a  percutaneous coronary intervention attempt to open up the subtotal occlusion  of the LAD.  Pending the results of  this procedure, further intervention may be contemplated on the circumflex  coronary artery.  If reperfusion of the LAD is not successful, consideration  could be given to  coronary artery bypass grafting.  The results have been  related to Dr. Dietrich Pates.      GED/MEDQ  D:  10/11/2004  T:  10/11/2004  Job:  [id number]   cc:   Donna Bernard, M.D.  [address]  [place]  [place] [address]  Fax: [phone]   [place] Bing, M.D.

## 2010-09-29 NOTE — Procedures (Signed)
NAMEERYCK, NEGRON               ACCOUNT NO.:  [id number]   MEDICAL RECORD NO.:  [id number]          PATIENT TYPE:  OUT   LOCATION:  RAD                           FACILITY:  [facility]   PHYSICIAN:  [place] Bing, M.D.  DATE OF BIRTH:  05/27/1940   DATE OF PROCEDURE:  DATE OF DISCHARGE:                                  ECHOCARDIOGRAM   REFERRING PHYSICIAN:  Donna Bernard, M.D.   CLINICAL DATA:  A 70 year old gentleman with EKG evidence for prior  myocardial infarction and a history of hypertension. Aorta 3.8, left atrium  3.4, septum 1.9, posterior wall 1.7, LV diastole 3.3, LV systole 2.3.   1.  Technically suboptimal, but adequate echocardiographic study.  2.  Left atrial size at the upper limit of normal; normal right atrium and      right ventricle.  3.  Normal mitral valve; mild annular calcification.  4.  Mild to moderate aortic valvular sclerosis; mild calcification of the      wall of the proximal ascending aorta.  5.  The aortic root diameter is at the upper limit of normal to slightly      enlarged as is the aortic arch.  6.  Normal tricuspid valve with physiologic regurgitation and normal      estimated RV systolic pressure.  7.  Normal mitral valve; mild annular calcification.  8.  Pulmonic valve not well seen - probably normal; normal proximal      pulmonary artery.  9.  Normal left ventricular size; moderate hypertrophy with disproportionate      septal thickening; normal regional and global function.  10. Normal IVC.      RR/MEDQ  D:  10/04/2004  T:  10/04/2004  Job:  [id number]

## 2010-09-29 NOTE — Consult Note (Signed)
[name], [name]                         ACCOUNT NO.:  [id number]   MEDICAL RECORD NO.:  [id number]                   PATIENT TYPE:  OIB   LOCATION:  2899                                 FACILITY:  [facility]   PHYSICIAN:  Janeece Riggers. Karin Golden, M.D.                DATE OF BIRTH:  09-19-1940   DATE OF CONSULTATION:  07/01/2003  DATE OF DISCHARGE:  07/01/2003                                   CONSULTATION   REASON FOR CONSULTATION:  Consultation for interpretation of intracranial  views from cerebral arteriogram done July 01, 2003, by Dr. Hart Rochester.   CLINICAL HISTORY:  Posterior circulation TIA.   Right internal carotid arteriogram:  This vessel is opacified via a common  carotid injection.  The carotid siphon is widely patent.  Flow from this  injection serves the anterior and middle cerebral artery territories.  There  is no stenosis, aneurysm, or vascular malformation.  No flow is seen through  either a patent anterior or posterior communicating artery.   Left internal carotid arteriogram:  This vessel is opacified via a common  carotid injection.  The vessel is widely patent with no siphon stenosis.  Flow from this injection serves the left anterior and middle cerebral artery  territories.  No sign of a patent posterior communicating artery.  There is  evidence of flow through an anterior communicating artery on this injection.   Left vertebral artery arteriogram:  This is a selective injection.  The left  vertebral artery is a large vessel, which is widely patent to the basilar.  There is no vertebral or basilar stenosis. I do not see any inflow from a  right vertebral artery, although that is not an absolutely reliable sign.  There is opacification of the left posterior inferior cerebellar artery, the  anterior inferior cerebellar artery system, superior cerebellar arteries,  and the posterior cerebellar arteries.  All of these vessels appear patent.   IMPRESSION:  Intrinsically  normal intracranial vasculature as described.  The right vertebral artery was not studied.  I do not see definite inflow  wash-out at the vertebrobasilar junction region from the right side, and  this vessel may not be patent.                                               Mark E. Karin Golden, M.D.    MES/MEDQ  D:  11/15/2003  T:  11/16/2003  Job:  [id number]

## 2010-09-29 NOTE — H&P (Signed)
[name], [name]               ACCOUNT NO.:  [id number]   MEDICAL RECORD NO.:  [id number]          PATIENT TYPE:  INP   LOCATION:                               FACILITY:  [facility]   PHYSICIAN:  Salvatore Decent. Cornelius Moras, M.D. DATE OF BIRTH:  01/30/1941   DATE OF ADMISSION:  10/20/2004  DATE OF DISCHARGE:                                HISTORY & PHYSICAL   PRESENTING CHIEF COMPLAINT:  Abnormal stress test   REASON FOR CONSULTATION:  Severe three-vessel coronary artery disease.   HISTORY OF PRESENT ILLNESS:  [name] is a 70 year old retired white male  from [place] who is followed by Dr. Lubertha South. He has no previous  history of coronary artery disease but risk factors notable for history of  hypertension, hyperlipidemia, and longstanding heavy tobacco abuse. He  apparently recently underwent a stress echo at Dr. Fletcher Anon discretion, and  this was found to be abnormal and suggestive of possible anterior wall  ischemia. He was referred to Dr. Donnamarie Rossetti and subsequently underwent  elective diagnostic catheterization by Dr. Lewayne Bunting on Oct 10, 2004.  Findings at that time were notable for severe three-vessel coronary artery  disease with normal left ventricular function. Subsequent to this, he was  referred to Dr. Randa Evens for possible percutaneous coronary  intervention. He is brought in for this procedure on June 2, and repeat  catheterization was performed. However, it was discovered that he had long  segment chronic occlusion of the left anterior descending coronary artery,  and an attempt at percutaneous coronary intervention was aborted. Mr.  Ramond Huynh was instructed to discontinue taking Plavix, and at that time he was  subsequently referred to [facility] for further management and therapy.   REVIEW OF SYSTEMS:  GENERAL: The patient reports feeling well, although he  does admit to gradual exertional fatigue that has progressed over the last  few years. He has good appetite,  has not been gaining or losing weight.  CARDIAC: The patient denies any symptoms of chest pain, chest tightness or  chest pressure either at rest or with activity. He denies any shortness of  breath either with activity or at rest. He has not had any palpitations,  syncopal episodes, or dizzy spells. He does have occasional pain in his left  shoulder which is a sort of dull pain, and this somewhat radiates to his  left elbow. This is intermittent, does not seem to be associated with  physical activity. He has attributed this to chronic shoulder problems.  RESPIRATORY: Notable only for occasional episodes of wheezing that seems to  be exacerbated by seasonal allergies. The patient denies productive cough or  hemoptysis. GASTROINTESTINAL: Negative. The patient has no difficulty  swallowing. He reports normal bowel function. He denies hematochezia,  hematemesis, melena. GENITOURINARY: Negative. PERIPHERAL VASCULAR: Notable  for bilateral calf claudication. This occurs when he ambulates approximately  500 yards and is relieved by rest. It seems to be worse on the left than the  right. This does not seem to limit him too much physically. NEUROLOGIC:  Negative. The patient denies any symptoms suggestive of  previous TIA or  stroke. He has had some dizzy spells in the past but none recently.  MUSCULOSKELETAL: Essentially negative. The patient has had problems with his  neck and back in the past, but this has not been problematic recently. He  does report this left shoulder pain as described previously. PSYCHIATRIC:  Negative. HEENT: Negative.   PAST MEDICAL HISTORY:  1.  Hypertension.  2.  Hyperlipidemia.  3.  Longstanding tobacco abuse.  4.  Peripheral vascular disease.  5.  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  Anterior cervical fusion approximately six or eight months ago.  2.  Lumbosacral spine surgery approximately 20 years ago for  degenerative      disk disease.   FAMILY HISTORY:  Noncontributory, notable for the absence of premature  coronary artery disease.   SOCIAL HISTORY:  The patient is married and lives with his wife. 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 The patient has been retired  for 3 years. He remains fairly active physically although does not exert  himself strenuously on a regular basis. He has a longstanding history of  heavy tobacco use, smoking at least one pack of cigarettes per day for many  years. He denies any history of significant alcohol consumption.   CURRENT MEDICATIONS:  1.  Toprol XL 50 milligrams once daily.  2.  Gemfibrozil 600 milligrams 2 tablets daily.  3.  Hyzaar 50/12.5 two tablets daily  4.  Aspirin 325 milligrams daily  5.  Plavix 75 milligrams daily (stopped 2 days ago).   DRUG ALLERGIES:  None known.   PHYSICAL EXAMINATION:  GENERAL:  The patient is a well-appearing male who  appears his stated age in no acute distress.  HEENT:  Exam is grossly unrevealing.  NECK: The neck is supple. There is no cervical or supraclavicular  lymphadenopathy. There is no jugular venous distension. No carotid bruits  are noted.  CHEST: Auscultation of the chest demonstrates clear symmetrical breath  sounds bilaterally. No wheezes or rhonchi are revealed.  CARDIOVASCULAR:  Exam includes regular rate and rhythm. No murmurs, rubs or  gallops noted.  ABDOMEN: The abdomen is soft, nontender. There are no palpable masses. Bowel  sounds are present.  EXTREMITIES:  Warm and well-perfused. There is no lower extremity edema.  Distal pulses are not palpable in either lower leg at the ankle. There is no  sign of venous insufficiency.  RECTAL/GU:  Exams are both deferred.  SKIN:  The skin is clean and dry, healthy appearing throughout. NEUROLOGIC:  Examination is grossly nonfocal and symmetrical.    DIAGNOSTIC TESTS:  Cardiac catheterization performed May 30 and second  cardiac catheterization performed June 2 are both reviewed. These  demonstrate severe three-vessel coronary artery disease with preserved left  ventricular function. Specifically, there is long segment chronic occlusion  of the mid left anterior descending coronary artery. The distal left  anterior descending coronary artery fills late on collaterals on left  coronary injection. It will probably be graftable but may or may not be a  very good target for grafting.. There is 70% proximal stenosis of the small  to medium sized diagonal branch. There is 90% stenosis of mid left  circumflex coronary artery arising at takeoff of a large circumflex marginal  branch. There is left-dominant coronary circulation. There is 90% proximal  stenosis of the right coronary  artery, although this vessel was relatively  small and nondominant. Left ventricular function appears preserved with no  significant wall motion abnormalities.   IMPRESSION:  Severe three-vessel coronary artery disease with abnormal  stress test and normal left ventricular function. I believe that [name]  would best be treated by elective coronary artery bypass grafting.   PLAN:  I have discussed options at length with [name] and his wife.  Alternative treatment strategies have been discussed. He understands and  accepts all associated risks of surgery including but not limited to risk of  death, stroke, myocardial infarction, congestive heart failure, respiratory  failure, pneumonia, bleeding requiring blood transfusion, arrhythmia,  infection, and recurrent coronary artery disease. All their questions have  been addressed. We tentatively plan to proceed with surgery on Friday this  week, June 9.      CHO/MEDQ  D:  10/16/2004  T:  10/16/2004  Job:  [id number]   cc:   [place]o Bing, M.D.   Salvadore Farber, M.D. [facility]  [address]  [address]   [place]  [place] [address]   Donna Bernard, M.D.  [address]  [place]  [place] [address]  Fax: [phone]   Learta Codding, M.D. [facility]

## 2010-09-29 NOTE — Op Note (Signed)
[name], [name]                         ACCOUNT NO.:  [id number]   MEDICAL RECORD NO.:  [id number]                   PATIENT TYPE:  AMB   LOCATION:  DAY                                  FACILITY:  [facility]   PHYSICIAN:  Quita Skye. Hart Rochester, M.D.               DATE OF BIRTH:  06/25/1940   DATE OF PROCEDURE:  DATE OF DISCHARGE:  06/28/2003                                 OPERATIVE REPORT   PREOPERATIVE DIAGNOSIS:  Posterior circulation transient ischemic attacks,  rule out bilateral carotid occlusive disease or vertebral occlusive disease.   POSTOPERATIVE DIAGNOSIS:  Posterior circulation transient ischemic attacks,  rule out bilateral carotid occlusive disease or vertebral occlusive disease.   OPERATION PERFORMED:  1. Arch aortogram via right common femoral approach.  2. Selective right common carotid angiogram (biplane).  3. Selective left common carotid angiogram (biplane).  4. Selective left subclavian angiogram.  5. Selective innominate artery angiogram.   SURGEON:  Quita Skye. Hart Rochester, M.D.   ANESTHESIA:  Local Xylocaine.   COMPLICATIONS:  None.   CONTRAST:  160 mL.   DESCRIPTION OF PROCEDURE:  The patient was taken to [facility] peripheral  endovascular lab and placed in the supine position at which time the right  groin was prepped with Betadine scrub and solution and draped in routine  sterile manner.  After infiltration with 1% Xylocaine, the right common  femoral artery was entered percutaneously.  Guidewire was passed into the  suprarenal aorta under fluoroscopic guidance.  A 5 French sheath and dilator  were passed over the guidewire.  Dilator were removed and a standard pigtail  catheter advanced to the aortic arch.  Standard arch aortogram was then  performed in 40 degrees LAO projection injecting 30 mL of contrast at 20 mL  per second.  This revealed the arch anatomy to be normal with a widely  patent innominate artery.  The right subclavian and common carotid  arteries  appeared normal on the right with no visualization of the right vertebral  artery.  The left common carotid artery arose from the aortic arch adjacent  to the innominate artery and had no proximal stenosis and filled rapidly up  to the bifurcation.  The left subclavian artery also had no evidence of  significant stenosis and there was a large left vertebral artery with an  apparent ostial stenosis visualized better later in the procedure.  The  pigtail catheter was then exchanged for an H1 headhunter catheter and  selective catheterization of the right common carotid artery was obtained.  The headhunter catheter would not advance in to the right common carotid  artery.  Therefore a wire extension was utilized and an end hold catheter  was used to cannulate the right common carotid artery.  Biplane right  carotid angiogram was then performed with intracerebral views and this  revealed a focal 75% stenosis at the origin of the right internal carotid  artery.  The internal carotid artery around 5 cm distal to the origin had  what appeared to be a 360 degree loop but it was widely patent of large  caliber.  There was no significant stenosis of the common or external  carotid artery on the right.  Intracerebral views will be interpreted by the  radiologist.  The left common carotid artery was selectively cannulated  using a Simmons 1 catheter and selective biplane left common carotid  angiogram with intra cerebral views was obtained with similar technique.  This revealed a mild (40%) stenosis at the origin of the left internal  carotid artery with no evidence of significant stenosis in the left common  or external carotid arteries.  Following this, the left subclavian artery  was cannulated using the H1 catheter and a left subclavian angiogram  performed with intracerebral views.  The left vertebral artery was quite  large and was slightly tortuous at its origin.  Multiple views were  obtained  which revealed an apparent 80% focal stenosis at the origin of the left  vertebral artery.  Following this, the innominate artery was once again  cannulated for an additional view to see if the right vertebral artery  visualized and was never able to visualize the definite right vertebral  artery.  It was presumably occluded.  Following this, the catheter was  removed over the guidewire.  Sheath removed.  Adequate compression applied.  No complications ensued.   FINDINGS:  1. Normal aortic arch anatomy.  2. 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  3. 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  4. Focal apparent 80% stenosis at origin of left vertebral artery.  5. 40% stenosis at origin of the left internal carotid artery.                                               Quita Skye Hart Rochester, M.D.    JDL/MEDQ  D:  07/01/2003  T:  07/01/2003  Job:  [id number]

## 2010-09-29 NOTE — Discharge Summary (Signed)
[name], [name]               ACCOUNT NO.:  [id number]   MEDICAL RECORD NO.:  [id number]          PATIENT TYPE:  INP   LOCATION:  2019                         FACILITY:  [facility]   PHYSICIAN:  Salvatore Decent. Cornelius Moras, M.D. DATE OF BIRTH:  April 18, 1941   DATE OF ADMISSION:  10/20/2004  DATE OF DISCHARGE:                                 DISCHARGE SUMMARY   No dictation for this job.       AY/MEDQ  D:  10/23/2004  T:  10/23/2004  Job:  [id number]

## 2010-09-29 NOTE — Op Note (Signed)
NAMEDORRANCE, SELLICK                         ACCOUNT NO.:  [id number]   MEDICAL RECORD NO.:  [id number]                   PATIENT TYPE:  OIB   LOCATION:  2899                                 FACILITY:  [facility]   PHYSICIAN:  Davonna Belling, M.D.                   DATE OF BIRTH:  [birth year]   DATE OF PROCEDURE:  07/01/2003  DATE OF DISCHARGE:  07/01/2003                                 OPERATIVE REPORT   PROCEDURE:  Bilateral cerebral angiography and left vertebrobasilar  angiography.   This report is prepared following review of arch aortogram, bilateral  carotid arteriogram, and left vertebral arteriogram performed by Quita Skye.  Hart Rochester, M.D. on July 01, 2003.   Arch aortogram demonstrates moderate proximal stenosis of the left common  carotid artery.   AP and lateral views of the right internal carotid artery demonstrate no  significant proximal stenosis.  There are no intracranial abnormalities  identified.  No aneurysms are seen given the AP and lateral views only.  There is a significant calcific stenosis at the origin of the right internal  carotid artery.   Injection of the subclavian with filming over the left vertebral origin  reveals a 50% stenosis.  Injection of the same vessel with filming over the  head in the AP and lateral views demonstrates no significant basilar  stenosis or intracranial aneurysm. There is no reflux down the right  vertebral to evaluate this vessel.   AP and lateral views of the left internal carotid artery revealed no  significant proximal stenosis.  There is mild nonstenotic irregularity of  the left internal carotid artery siphon.  No branch occlusions are seen  intracranially.  There are no intracranial aneurysms.  There appears to be a  moderate extracranial stenosis of the left internal carotid artery at its  origin.   IMPRESSION:  1. No significant intracranial abnormalities are demonstrated.  2. Within the limits of the films presented,  there are no observed     intracranial aneurysms.  3. 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                                               Davonna Belling, M.D.    JC/MEDQ  D:  07/13/2003  T:  07/14/2003  Job:  [id number]

## 2010-09-29 NOTE — Op Note (Signed)
NAMEDIA, JEFFERYS               ACCOUNT NO.:  [id number]   MEDICAL RECORD NO.:  [id number]          PATIENT TYPE:  INP   LOCATION:  2314                         FACILITY:  [facility]   PHYSICIAN:  Judie Petit, M.D. DATE OF BIRTH:  12-Jun-1940   DATE OF PROCEDURE:  10/20/2004  DATE OF DISCHARGE:                                 OPERATIVE REPORT   PROCEDURE:  Intraoperative transesophageal echocardiogram.   INDICATIONS:  Mr. Zarcone is a 70 year old male with a past medical history  significant for coronary artery disease scheduled for coronary artery bypass  grafting by Dr. Salvatore Decent. Cornelius Moras today. The transesophageal echocardiogram  will be utilized to assess ventricular function.   DESCRIPTION OF PROCEDURE:  On the morning of surgery, the patient was taken  to the PACU holding area where pulmonary artery and radial artery lines were  placed under local anesthesia with sedation. The patient was subsequently  taken to the operating room where general anesthesia was induced. The airway  was secured with an oral endotracheal tube. The gastric contents were  suctioned with the oral gastric tube. The transesophageal echocardiogram was  placed into a sleeve that was heavily lubricated and then passed down to the  oropharynx without resistance.   At the completion of the procedure, the probe was removed and there were no  problems.   PRECORONARY ARTERY BYPASS EXAMINATION:  Left ventricle was concentrically  thickened. There was no evidence of regional abnormalities. There appeared  to be normal contractility. Papillary muscles were well outlined. There were  no masses noted within the left ventricular chamber.   Aortic valve:  Aortic valve appeared to function appropriately. There was no  significant regurgitant flow on Doppler examination. The aortic valve was  trileaflet in nature.   Mitral valve:  Mitral valve leaflets opened and closed appropriately. There  was no evidence of  significant mitral regurgitation.   Left atrium:  Left atrium appeared normal. The left intra-atrial septum was  intact.   Right ventricle:  The right ventricle grossly was within normal limits. The  right heart exam appeared unremarkable.   The patient was placed on cardiopulmonary bypass and underwent coronary  artery bypass grafting.   POSTCARDIOPULMONARY BYPASS ECHOCARDIOGRAM REPORT:  Left ventricle:  The left  ventricle revealed evidence of good contractility. Initially, the ventricle  revealed evidence of decreased volume; however, with dye placement, the  contractility improved. The rest of the exam was similar to prebypass  examination.   The patient was taken to the surgical intensive care unit in stable  condition.       CE/MEDQ  D:  10/20/2004  T:  10/20/2004  Job:  [id number]   cc:   Patient's chart

## 2010-09-29 NOTE — Op Note (Signed)
NAME:  [name], [name]                         ACCOUNT NO.:  [id number]   MEDICAL RECORD NO.:  [id number]                   PATIENT TYPE:  AMB   LOCATION:  DAY                                  FACILITY:  [facility]   PHYSICIAN:  R. Roetta Sessions, M.D.              DATE OF BIRTH:  Jul 05, 1940   DATE OF PROCEDURE:  06/28/2003  DATE OF DISCHARGE:                                 OPERATIVE REPORT   PROCEDURE:  Colonoscopy with biopsy and snare polypectomy.   INDICATIONS FOR PROCEDURE:  The patient is a 70 year old gentleman  ___________ for colonoscopy, a positive family in his brother who succumb to  the disease in his 38's.  [name] tells me he had a colonoscopy some 20  years ago but does not recall any specific findings.  Colonoscopy is now  being done as a high risk screening maneuver. This approach has been  discussed with the patient at length.  The potential risks, benefits, and  alternatives have been reviewed.  He does not have any GI symptoms.   MONITORING:  O2 saturation, blood pressure, pulse and respirations were  monitored throughout the entire procedure.   CONSCIOUS SEDATION:  Versed 3 mg IV, Demerol 75 mg IV in divided doses.   INSTRUMENT:  Olympus videochip adult colonoscope.   FINDINGS:  Digital rectal exam revealed no abnormalities.   ENDOSCOPIC FINDINGS:  The prep was marginal. There was quite a bit of viscus  liquid filled stool throughout the colon and bile stained mucous which had  to be dealt with. It was very difficult to wash and suction it all away.   RECTUM:  Examination of the rectal mucosa including retroflexed view of the  anal verge revealed no abnormalities.   COLON:  The colonic mucosa was surveyed from the rectosigmoid junction  through the left transverse right colon to the area of the appendiceal  orifice, ileocecal valve and cecum. These structures were seen and  photographed for the record. The terminal ileum was intubated to 10 cm.  From this  level, the scope was slowly and all previously mentioned mucosal  surfaces were again seen. The terminal ileum appeared normal.  The patient  was noted to have left sided diverticula.  There was a 6 mm pedunculated  polyp on a stalk at 30 cm and an adjacent 3 mm polyp on a stalk. A smaller  one was cold snared and not recovered. The larger of the two was resected  with snare cautery and recovered.  At 25 cm, there was a 1 cm or so focal  area of inflamed appearing colonic mucosa with a reticulated appearing  mucosa. It appeared that this was all mucosa but there was a stalk effect at  this level.  It was palpated with the biopsy forceps, it was very soft, it  was biopsied multiple times.  It did initially give the appearance of a  pedunculated polyp;  however, again most of this lesion if not all of it  appeared to be submucosal.  It did not have a typical adenomatous  appearance. Please see photos. The remainder of the colonic mucosa appeared  normal.  The patient tolerated the procedure well and was reacted in  endoscopy.   IMPRESSION:  1. Rectum normal.  2. Colon left sided diverticula. Pedunculated polyps at 30 cm, resected with     snare as described above. Area of polypoid like mucosa with a very     localized area of reticulating inflammatory appearance but the lesion     overall appeared to be submucosal. It was cold biopsied.  No attempts at     snare removal of this lesion were made at 25 cm.  The remainder of the     colonic mucosa appeared normal.  Normal terminal ileum.   RECOMMENDATIONS:  1. No aspirin or arthritis medications for next 10 days.  2. Followup on path.  3. Further recommendations to follow.      ___________________________________________                                            Jonathon Bellows, M.D.   RMR/MEDQ  D:  06/28/2003  T:  06/28/2003  Job:  [id number]

## 2010-09-29 NOTE — Consult Note (Signed)
NAMEROGELIO, WAYNICK               ACCOUNT NO.:  [id number]   MEDICAL RECORD NO.:  [id number]          PATIENT TYPE:  EMS   LOCATION:  MAJO                         FACILITY:  [facility]   PHYSICIAN:  Melvyn Novas, M.D.  DATE OF BIRTH:  1941-03-24   DATE OF CONSULTATION:  03/24/2007  DATE OF DISCHARGE:                                 CONSULTATION   This is a neurology consultation as a code stroke that became an  admission to the stroke service. He is a 70 year old Caucasian  gentleman.   Mr. Bott presented at 1:00 p.m. on January 19, 2007 to the [facility] ER stating that he had a problem with getting his words out.  He  knew what he wanted to say, he explained, but was unable to express  himself.  At the same time, his wife noticed a right facial droop and  drooling which has meanwhile resolved.  The whole event lasted about 30  minutes and no residual was noted at the time the patient arrived at the  ER.  No visible symptoms.  No dysarthria.  No aphagia. The patient had  an NIH stroke scale score of zero.   Dr. Denton Lank had already ordered a CBC, a PT/INR and a Chem-7.  The  patient's INR was 1.1.  The patient had an H&H of 15.2 and 43.8,  platelet count of 175,000, white blood cell count of 7.1.  Sodium was  133, potassium 4.9, creatinine 1.03, glucose 98.  The patient had an AST  of 20 and ALT of 24, normal total bilirubin.  Glomerular filtration rate  was above 60.  CT showed no abnormalities.   HOME MEDICATIONS:  Include lisinopril, aspirin, multivitamin fish oil.  The patient states that he was supposed to take Lipitor but discontinued  it on December 23, 2006.   PAST MEDICAL HISTORY:  Positive for  1. Hypertension.  2. Aortic aneurysm.  3. Coronary artery disease.  4. Peripheral vascular disease with left femoral artery stenting,      renal artery stenosis and three-vessel CABG.  5. The patient also suffers from hyperlipidemia.   He is a long standing smoker.  Denies  alcohol use.   This 70 year old male was admitted for stroke.  He has a family history  of coronary artery disease, hypertension and a past medical history of  multiple risk factors as named above.  The patient's weight is 295  pounds at a height of 70 inches.  The patient received heparin per  pharmacy protocol without bolus.   MENTAL STATUS:  He is alert and oriented x3.  No aphasia, dysarthria or  apraxia.  CRANIAL NERVES:  The patient had full visual fields to bilateral  simultaneous stimulation.  Equal pupillary reaction, no facial droop was  noted.  The patient can whistle, raise his eyebrows and close his eyes  tight.   Motor examination shows deep tendon reflexes to be symmetric with  downgoing toes to plantar stimulation extension of all four extremities  antigravity is possible, no pronator drift is seen.  Gait and station is normal.  The patient  could perform a finger-to-nose test without dysmetria.   The patient's sensory is intact to vibration, temperature and pinprick  and fine touch.   The patient will be admitted under the assumption that he suffered a  TIA, ICD code 434.91.  The patient will be followed by [facility]  Cardiology.   ADDENDUM:  I was able to locate an angiogram performed in February 2005  through our colleagues at [facility].  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  Given the patient's vasculopathic  history, I think that we should leave him on heparin for tonight until  he can be seen tomorrow by Dr. Dietrich Pates from the [facility] Cardiology  team.   An MRI at this time is pending.  I suspect that we need to evaluate this  patient for TIA versus embolic artifact.  Dr. Pearlean Brownie to follow.      Melvyn Novas, M.D.  Electronically Signed     CD/MEDQ  D:  03/24/2007  T:  03/25/2007  Job:  [id number]

## 2010-09-29 NOTE — Op Note (Signed)
[name], [name]               ACCOUNT NO.:  [id number]   MEDICAL RECORD NO.:  [id number]          PATIENT TYPE:  OBV   LOCATION:  5502                         FACILITY:  [facility]   PHYSICIAN:  Quita Skye. Hart Rochester, M.D.  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   PROCEDURE:  1.  Abdominal aortogram via right common femoral approach.  2.  Selective catheterization right renal artery with heart renal angiogram.  3.  PTA and stenting of right renal artery stenosis using a Genesis on      Aviator (6 mm x 15 mm) at      1.  10 atmospheres for 45 seconds.      2.  12 atmospheres for 30 seconds.  4.  Selective catheterization of left renal artery with left renal      angiogram.  5.  Predilatation of left renal artery using a 3 mm x 20 mm Savvy PTA      catheter at 10 atmospheres for 45 seconds.  6.  Successful PTA and stenting of left renal artery stenosis using a      Genesis on Aviator 6 mm x 15 mm at      1.  10 atmospheres for 50 seconds      2.  12 atmospheres for 40 seconds with completion of angiogram.   SURGEON:  Quita Skye. Hart Rochester, M.D.   ANESTHESIA:  Local Xylocaine and Versed 2 mg intravenously heparin 4000  units plus 3000 units   COMPLICATIONS:  None.   DESCRIPTION OF PROCEDURE:  Patient was taken to the [facility] Peripheral  Endovascular lab, placed in the supine position, at which time the right  groin was prepped with Betadine solution and draped in routine sterile  manner. After infiltration with 1% Xylocaine, the right common femoral  artery was entered percutaneously; a Wholey wire passed into the suprarenal  aorta under fluoroscopic guidance. A 6-French sheath and dilator were passed  over the guidewire and dilator removed. A standard pigtail catheter  positioned in the  suprarenal aorta. A flushed abdominal aortogram was  performed injecting 20 mL of contrast at 20 mL per second. This revealed the  aorta to be widely patent with some diffuse atherosclerotic changes in the  infrarenal aorta. There were single renal arteries bilaterally. The right  renal artery had a 90% focal stenosis, beginning at the ostium and extending  1 cm distally. The left renal artery had a 95% left renal artery stenosis  beginning at the ostium and extending out to a point just proximal to a  large inferior branch; and it was about 1 cm in length as well. The patient  was then given 4000 units of heparin, and the pigtail catheter exchanged for  an IMA guide catheter.   Right renal artery was easily cannulated and a 0.014 stabilizer wire  advanced into the distal right renal artery. A right renal angiogram was  then performed through the IMA catheter with a hand injection revealing this  severe focal stenosis at the ostium. The lesion was then dilated using a  Genesis on Aviator system (6 mm x 15 mm) with inflation to 10 atmospheres  for 45 seconds and a second inflation flaring the ostium at 12 atmospheres  for 30 seconds.  Completion of angiogram through the guide catheter revealed  an excellent result and good placement of the stent. The wire was then  withdrawn into the catheter; and using the IMA catheter in addition to a SOS  diagnostic catheter (5-French) the left renal artery lesion was crossed with  an 014 whisper wire. The left renal artery lesion was then predilated using  a 3 mm x 2 x 20 mm Savvy angioplasty catheter inflated at 10 atmospheres for  45 seconds.   Following this primary stenting was performed using a Genesis on Aviator (6  mm x 15 mm) inflated at 10 atmospheres for 50 seconds, and a second  inflation to flare the ostium of the stent, inflating it to 12 atmospheres  for 40 seconds. Completion of the angiogram of the left renal artery also  revealed an  excellent cosmetic result with good filling of the inferior pole  branch which was covered by the stent. The diagnostic catheters were then  removed, adequate compression applied after correction of the ACT and  removal sheath. No complications ensued.   FINDINGS:  1.  Focal 90% ostial right renal artery stenosis.  2.  Successful PTA and stenting of proximal right renal artery stenosis      using Genesis on Aviator (6 x 15) at 10 atmospheres for 45 seconds and      12 atmospheres for 30 seconds.  3.  Focal 95% left renal artery stenosis with a successful predilatation of      lesion using a 3 mm x 20 mm Savvy angioplasty catheter at 10 atmospheres      for 45 seconds, and PTA and stenting of left renal artery stenosis using      a 6 mm x 15 mm Genesis on Aviator at 10 atmospheres for 50 seconds and      12 atmospheres for 40 seconds. Excellent cosmetic result of both renal      artery stenoses.           ______________________________  Quita Skye Hart Rochester, M.D.     JDL/MEDQ  D:  08/02/2005  T:  08/03/2005  Job:  [id number]

## 2010-09-29 NOTE — Op Note (Signed)
NAMEJUDSON, [name]               ACCOUNT NO.:  [id number]   MEDICAL RECORD NO.:  [id number]          PATIENT TYPE:  INP   LOCATION:  2314                         FACILITY:  [facility]   PHYSICIAN:  Salvatore Decent. Cornelius Moras, M.D. DATE OF BIRTH:  1941-04-17   DATE OF PROCEDURE:  10/20/2004  DATE OF DISCHARGE:                                 OPERATIVE REPORT   PREOPERATIVE DIAGNOSIS:  Severe three-vessel coronary artery disease.   POSTOPERATIVE DIAGNOSIS:  Severe three-vessel coronary artery disease.   PROCEDURE:  Median sternotomy for coronary artery bypass grafting x3 (left  internal mammary artery to distal left anterior descending coronary artery,  saphenous vein graft to circumflex marginal branch, saphenous vein graft to  left posterolateral branch, endoscopic saphenous vein harvest from right  thigh and upper portion of right lower leg).   SURGEON:  Purcell Nails, MD   ASSISTANT:  Ms. Lujean Rave, [place]   ANESTHESIA:  General.   BRIEF CLINICAL NOTE:  The patient is a 70 year old patient from [place],  followed by Dr. Lubertha South.  The patient has no previous history of  coronary artery disease, but risk factors notable for a history of  hypertension, hyperlipidemia, and longstanding heavy tobacco abuse.  He  recently underwent a stress test by Dr. Gerda Diss and was found to have  abnormal findings suggestive of anterior wall myocardial ischemia.  He was  referred to Dr. Donnamarie Rossetti and subsequently underwent cardiac  catheterization by Dr. Lewayne Bunting.  Findings at the time of catheterization  were notable for severe three-vessel coronary artery disease with normal  left ventricular function.  A full consultation has been dictated  previously.   OPERATIVE CONSENT:  The patient and his family have been counseled at length  regarding the indications, risks, and potential benefits of coronary artery  bypass grafting.  Alternative treatment strategies have been discussed.  They  understand and accept all associated risks of surgery and desire to  proceed as described.   OPERATIVE FINDINGS:  1.  Normal left ventricular function.  2.  Left dominant coronary circulation.  3.  Good-quality internal mammary artery and saphenous vein conduit for      grafting.  4.  Good-quality targets for bypass grafting.  5.  Posterior descending coronary artery off the distal left circumflex      coronary artery too small for grafting.   OPERATIVE NOTE IN DETAIL:  The patient is brought to the operating room on  the above-mentioned date and central monitoring was established by the  Anesthesia Service under the care and direction of Dr. Judie Petit.  Specifically, a Swan-Ganz catheter is placed through the internal jugular  approach.  A radial arterial line is placed.  Intravenous antibiotics are  administered.  Following induction with general endotracheal anesthesia, a  Foley catheter is placed.  The patient's chest, abdomen, both groins, and  both lower extremities are prepared and draped in a sterile manner.   Baseline transesophageal echocardiogram is performed by Dr. Randa Evens.  This  demonstrates normal left ventricular function.  There is mild left  ventricular hypertrophy.  No significant abnormalities  of the aortic or  mitral valve are identified.   Median sternotomy incision is performed and the left internal mammary artery  is dissected from the chest wall and prepared for bypass grafting.  The left  internal mammary artery is a good-quality conduit.  Simultaneously, a  saphenous vein is obtained the patient's right thigh and the upper portion  of the right lower leg using endoscopic vein harvest technique.  The  saphenous vein is a good-quality conduit.  After the saphenous vein is  removed, the small surgical incisions in the right lower extremity are  closed in multiple layers with running absorbable suture.   The patient is heparinized systemically.  The  pericardium is opened.  The  ascending aorta is moderately diseased with atherosclerosis.  There are no  areas of heavy calcification.  The aorta is cannulated uneventfully.  A  venous cannula is placed through the right atrium.  Cardiopulmonary bypass  is begun and the surface of the heart is inspected.  There is mild left  ventricular hypertrophy.  There is left dominant coronary circulation.  The  distal left circumflex coronary artery gives rise to a fairly large  posterolateral branch.  Beyond this, there is a tiny posterior descending  coronary artery.  This vessel is too small for grafting.  There is a single  dominant circumflex marginal branch.  The diagonal branches off the left  anterior descending coronary artery are very small.  Portions of saphenous  vein and the left internal mammary artery are trimmed to appropriate  lengths.  A temperature probe is placed in the left ventricular septum.  A  cardioplegic catheter is placed in the ascending aorta.   The patient is allowed to cool passively to 32 degrees systemic temperature.  The aortic crossclamp is applied and cardioplegia is delivered initially in  an antegrade fashion through the aortic root.  Iced saline slush is applied  for topical hypothermia.  The initial cardioplegic arrest and myocardial  cooling are felt to be excellent.  Repeat doses of cardioplegia are  administered intermittently throughout the crossclamp portion of the  operation, both through the aortic root and down the subsequently placed  vein grafts to maintain septal temperature below 15 degrees centigrade.   The following distal coronary anastomoses are performed:  #1 - The  posterolateral branch off the distal left circumflex coronary artery is  grafted with a saphenous vein graft in an end-to-side fashion.  This vessel  measures 1.5 mm in diameter and is of good quality at the site of distal bypass.  #2 - The circumflex marginal branch is grafted  with a saphenous  vein graft in an end-to-side fashion.  This coronary measures 1.7 mm in  diameter and is of good quality at the site of distal bypass.  #3 - The  distal left anterior descending coronary artery is grafted with the left  internal mammary artery in an end-to-side fashion.  This vessel  is  diffusely diseased, but at the site of distal bypass, it measures 2 mm in  diameter.  It is of good quality.   Both proximal saphenous vein anastomoses are performed directly to the  ascending aorta prior to removal of the aortic crossclamp.  The left  ventricular septal temperature rises rapidly with reperfusion of the left  internal mammary artery.  The aortic crossclamp is removed after a total  crossclamp time of 58 minutes.   The heart begins to beat spontaneously without need for cardioversion.  All  proximal and distal coronary anastomoses are inspected for hemostasis and  appropriate graft orientation.  Epicardial pacing wires are affixed to the  right ventricular outflow tract and to the right atrial appendage.  The  patient is rewarmed to 37 degrees centigrade temperature.  The patient is  weaned from cardiopulmonary bypass without difficulty.  The patient's rhythm  at separation from bypass is normal sinus rhythm.  No inotropic support is  required.  Total cardiopulmonary bypass time for the operation is 72  minutes.   The venous and arterial cannulae are removed uneventfully.  Protamine is  administered to reverse the anticoagulation.  The mediastinum and left chest  are irrigated with saline solution containing vancomycin.  Meticulous  surgical hemostasis is ascertained.  The mediastinum and the left chest are  drained with 3 chest tubes exited through separate stab incisions  inferiorly.  The median sternotomy is closed with double-strength sternal  wire.   The On-Q continuous pain management system is utilized for postoperative  pain control.  Two Silastic 10-inches  catheters supplied with the On-Q  system are placed through separate stab incisions just above the costal  margin on either side of the sternum and oriented with the catheters deep in  the subcutaneous tissues immediately anterior to the costal cartilages just  lateral to the lateral border of the sternum on either side.  Each of these  catheters are then filled with 0.5% bupivacaine solution and ultimately  connected to a continuous infusion pump.   The soft tissues anterior to the sternal are closed in multiple layers and  the skin is closed with running subcuticular skin closure.  The patient  tolerated the procedure well and is transported to surgical intensive care  unit in stable condition.  There are no intraoperative complications.  All  sponge, instrument and needle counts are verified correct at completion of  the operation.  No blood products were administered.      CHO/MEDQ  D:  10/20/2004  T:  10/21/2004  Job:  [id number]   cc:   Sterling Bing, M.D.   Salvadore Farber, M.D. [facility]  [address]  [address]  [place]  [place] [address]   Learta Codding, M.D. [facility]   Lacretia Nicks Simone Curia, M.D.  [address]  [place]  [place] [address]  Fax: [phone]   Sidney Ace [facility] Cardiology

## 2010-09-29 NOTE — Op Note (Signed)
[name], [name]               ACCOUNT NO.:  [id number]   MEDICAL RECORD NO.:  [id number]          PATIENT TYPE:  AMB   LOCATION:  SDS                          FACILITY:  [facility]   PHYSICIAN:  Quita Skye. Hart Rochester, M.D.  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   PROCEDURE:  Abdominal aortogram with bilateral lower extremity runoff via  right common femoral approach.   SURGEON:  Dr. Hart Rochester.   ANESTHESIA:  Local Xylocaine.   CONTRAST:  202 mL.  Labetalol 10 milligrams IV for hypertension.   COMPLICATIONS:  None.   DESCRIPTION OF PROCEDURE:  Patient was taken to the [facility] peripheral  endovascular lab, placed in supine position at which time both groins were  prepped with Betadine scrub and solution and draped in routine sterile  manner. After infiltration of 1% Xylocaine, the right common femoral artery  was entered percutaneously. Guidewire passed into the suprarenal aorta under  fluoroscopic guidance. A 5-French sheath and dilator passed over the guide  wire, dilator removed. The standard pigtail catheter positioned in the  suprarenal aorta. Flush abdominal aortogram was performed injecting 20 mL  contrast at 20 mL per second. This revealed the aorta to be widely patent to  the renal arteries. There were focal 95% stenoses in the proximal 1 cm of  both renal arteries which were better delineated with RAO and LAO  projections following the initial injection. On the left, the lesion  extended up to an inferior pole branch and on the right it was in the body  of the main renal artery, not near a branch but both were very proximal in  location. The infrarenal aorta was diffusely diseased with atherosclerosis  and was slightly dilated and aneurysmal in its distal portion.  Inferior  mesenteric artery was patent. There was a deep ulcer crater at the origin of  the left common iliac artery not causing any stenosis. The left internal  iliac artery was severely diseased. The right internal iliac artery was  patent but diseased. Both common and external iliac arteries had no areas of  significant stenosis. Catheter was withdrawn into the terminal aorta and  bilateral lower extremity runoff performed injecting 88 mL contrast at 8 mL  per second. This revealed the iliacs to be as described but the right common  femoral, profunda femoris arteries to be widely patent. The right  superficial femoral artery had mild diffuse disease but no areas of severe  stenosis with a tapering 50% stenosis in the mid thigh. Popliteal artery  below the knee was patent and anterior tibial artery was the only in  continuity runoff vessel on the right in the proximal half which was the  only area well visualized. The tibioperoneal trunk was severely diseased.  The right posterior tibial artery was occluded and the peroneal artery was  severely diseased. On the left side there was diffuse superficial femoral  occlusive disease with a 95% relatively focal stenosis in the mid-to-distal  thigh, but plaque throughout this entire area. Popliteal artery on the left  was totally occluded below the knee and there were no named tibial vessels  patent. Additional views of the tibials using the peak hold technique were  obtained as well as the iliac bifurcation, confirming the above findings.   FINDINGS:  1.  Severe 95% focal proximal bilateral renal artery stenoses.  2.  Diffuse atherosclerotic changes of aortoiliac system with deep ulcer      crater, left common iliac artery at the origin with no significant      stenosis.  3.  Severe diseased left internal iliac artery.  4.  Severe left superficial femoral occlusive disease with near total      occlusion in the mid-to-distal thigh.  5.   Severe bilateral tibial occlusive disease.           ______________________________  Quita Skye. Hart Rochester, M.D.     JDL/MEDQ  D:  07/19/2005  T:  07/19/2005  Job:  [id number]

## 2010-11-21 ENCOUNTER — Encounter: Payer: Self-pay | Admitting: Cardiology

## 2011-02-23 LAB — HEMOGLOBIN A1C
Hgb A1c MFr Bld: 5.7
Hgb A1c MFr Bld: 5.7
Mean Plasma Glucose: 126
Mean Plasma Glucose: 126

## 2011-02-23 LAB — URINALYSIS, ROUTINE W REFLEX MICROSCOPIC
Bilirubin Urine: NEGATIVE
Glucose, UA: NEGATIVE
Hgb urine dipstick: NEGATIVE
Ketones, ur: NEGATIVE
Nitrite: NEGATIVE
Protein, ur: NEGATIVE
Specific Gravity, Urine: 1.011
Urobilinogen, UA: 0.2
pH: 5.5

## 2011-02-23 LAB — COMPREHENSIVE METABOLIC PANEL
ALT: 22
ALT: 24
ALT: 24
AST: 20
AST: 20
AST: 20
Albumin: 3.5
Albumin: 3.6
Albumin: 3.7
Alkaline Phosphatase: 78
Alkaline Phosphatase: 78
Alkaline Phosphatase: 80
BUN: 15
BUN: 15
BUN: 15
CO2: 25
CO2: 26
CO2: 28
Calcium: 9.1
Calcium: 9.1
Calcium: 9.1
Chloride: 100
Chloride: 101
Chloride: 103
Creatinine, Ser: 0.98
Creatinine, Ser: 1.03
Creatinine, Ser: 1.16
GFR calc Af Amer: >60
GFR calc Af Amer: >60
GFR calc Af Amer: >60
GFR calc non Af Amer: >60
GFR calc non Af Amer: >60
GFR calc non Af Amer: >60
Glucose, Bld: 148 — ABNORMAL HIGH
Glucose, Bld: 92
Glucose, Bld: 98
Potassium: 4.4
Potassium: 4.9
Potassium: 4.9
Sodium: 133 — ABNORMAL LOW
Sodium: 134 — ABNORMAL LOW
Sodium: 138
Total Bilirubin: 0.8
Total Bilirubin: 0.9
Total Bilirubin: 1.1
Total Protein: 6.5
Total Protein: 6.8
Total Protein: 6.8

## 2011-02-23 LAB — HEPARIN LEVEL (UNFRACTIONATED)
Heparin Unfractionated: 0.3
Heparin Unfractionated: 0.5

## 2011-02-23 LAB — ETHANOL
Alcohol, Ethyl (B): <5
Alcohol, Ethyl (B): <5

## 2011-02-23 LAB — DIFFERENTIAL
Basophils Absolute: 0
Basophils Relative: 0
Eosinophils Absolute: 0.1
Eosinophils Relative: 2
Lymphocytes Relative: 32
Lymphs Abs: 2.1
Monocytes Absolute: 0.5
Monocytes Relative: 8
Neutro Abs: 3.7
Neutrophils Relative %: 58

## 2011-02-23 LAB — LIPID PANEL
Cholesterol: 171
HDL: 25 — ABNORMAL LOW
LDL Cholesterol: 106 — ABNORMAL HIGH
Total CHOL/HDL Ratio: 6.8
Triglycerides: 202 — ABNORMAL HIGH
VLDL: 40

## 2011-02-23 LAB — BLOOD GAS, ARTERIAL
Acid-base deficit: 0.3
Bicarbonate: 23.4
FIO2: 0.21
O2 Saturation: 98.6
Patient temperature: 98.6
TCO2: 24.4
pCO2 arterial: 35
pH, Arterial: 7.44
pO2, Arterial: 109 — ABNORMAL HIGH

## 2011-02-23 LAB — CARDIAC PANEL(CRET KIN+CKTOT+MB+TROPI)
CK, MB: 1.7
CK, MB: 1.8
CK, MB: 1.8
Relative Index: INVALID
Relative Index: INVALID
Relative Index: INVALID
Total CK: 26
Total CK: 30
Total CK: 32
Troponin I: 0.01
Troponin I: 0.02
Troponin I: 0.02

## 2011-02-23 LAB — CBC
HCT: 43.8
HCT: 46
HCT: 46.2
Hemoglobin: 15.2
Hemoglobin: 15.7
Hemoglobin: 15.9
MCHC: 34
MCHC: 34.3
MCHC: 34.7
MCV: 94.3
MCV: 95.3
MCV: 95.5
Platelets: 175
Platelets: 180
Platelets: 180
RBC: 4.65
RBC: 4.82
RBC: 4.84
RDW: 12.8
RDW: 12.8
RDW: 12.9
WBC: 6.4
WBC: 7.1
WBC: 7.5

## 2011-02-23 LAB — TROPONIN I: Troponin I: 0.02

## 2011-02-23 LAB — CK TOTAL AND CKMB (NOT AT ARMC)
CK, MB: 2
Relative Index: INVALID
Total CK: 34

## 2011-02-23 LAB — PROTIME-INR
INR: 1.1
INR: 1.1
Prothrombin Time: 13.9
Prothrombin Time: 13.9

## 2011-02-23 LAB — RAPID URINE DRUG SCREEN, HOSP PERFORMED
Amphetamines: NOT DETECTED
Barbiturates: NOT DETECTED
Benzodiazepines: POSITIVE — AB
Cocaine: NOT DETECTED
Opiates: NOT DETECTED
Tetrahydrocannabinol: NOT DETECTED

## 2011-02-23 LAB — APTT
aPTT: 29
aPTT: 41 — ABNORMAL HIGH

## 2011-02-23 LAB — HOMOCYSTEINE: Homocysteine: 9.8

## 2011-04-08 ENCOUNTER — Encounter: Payer: Self-pay | Admitting: Cardiology

## 2011-05-21 ENCOUNTER — Encounter: Payer: Self-pay | Admitting: Cardiology

## 2011-05-21 ENCOUNTER — Ambulatory Visit (INDEPENDENT_AMBULATORY_CARE_PROVIDER_SITE_OTHER): Payer: Medicare Other | Admitting: Cardiology

## 2011-05-21 VITALS — BP 162/90 | HR 84 | Ht 70.0 in | Wt 216.1 lb

## 2011-05-21 DIAGNOSIS — F17201 Nicotine dependence, unspecified, in remission: Secondary | ICD-10-CM | POA: Insufficient documentation

## 2011-05-21 DIAGNOSIS — I714 Abdominal aortic aneurysm, without rupture: Secondary | ICD-10-CM

## 2011-05-21 DIAGNOSIS — I2510 Atherosclerotic heart disease of native coronary artery without angina pectoris: Secondary | ICD-10-CM

## 2011-05-21 DIAGNOSIS — I679 Cerebrovascular disease, unspecified: Secondary | ICD-10-CM

## 2011-05-21 DIAGNOSIS — D369 Benign neoplasm, unspecified site: Secondary | ICD-10-CM | POA: Insufficient documentation

## 2011-05-21 DIAGNOSIS — I10 Essential (primary) hypertension: Secondary | ICD-10-CM | POA: Insufficient documentation

## 2011-05-21 DIAGNOSIS — I739 Peripheral vascular disease, unspecified: Secondary | ICD-10-CM

## 2011-05-21 DIAGNOSIS — E785 Hyperlipidemia, unspecified: Secondary | ICD-10-CM

## 2011-05-21 DIAGNOSIS — M479 Spondylosis, unspecified: Secondary | ICD-10-CM

## 2011-05-21 MED ORDER — METOPROLOL SUCCINATE ER 50 MG PO TB24: 50.0000 mg | ORAL_TABLET | Freq: Every day | ORAL | Status: DC

## 2011-05-21 MED ORDER — LISINOPRIL 20 MG PO TABS: 20.0000 mg | ORAL_TABLET | Freq: Every day | ORAL | Status: DC

## 2011-05-21 NOTE — Assessment & Plan Note (Signed)
Excellent control of hyperlipidemia; current therapy will be continued. 

## 2011-05-21 NOTE — Assessment & Plan Note (Signed)
Minimal aortic aneurysm when last assessed 18 months ago.  A repeat CT scan without contrast will be obtained in 6 months.  As long as the aneurysm is less than 4 cm, Q. 24 months imaging studies should be adequate.

## 2011-05-21 NOTE — Progress Notes (Signed)
Patient ID: [name], male   DOB: [birth year], 71 y.o.   MRN: [id number] HPI: Scheduled return visit for this pleasant gentleman with coronary disease and multiple cardiovascular risk factors.  Since his last visit, he has done quite well with no hospitalizations or required urgent medical care.  He is relatively inactive, but does some exercise at the [facility] including lifting light weights, without difficulty.  He experiences claudication after approximately 5 minutes of walking that resolves promptly with rest.  Blood pressure control has been good when assessed at home.  He does not routinely monitor capillary blood glucose values.  He has not maintained his exercise routine over the holidays and has gained some weight.   Prior to Admission medications   Medication Sig Start Date End Date Taking? Authorizing Provider  ALPRAZolam Prudy Feeler) 0.5 MG tablet Take 0.5 mg by mouth as needed.     Yes Historical Provider, MD  clopidogrel (PLAVIX) 75 MG tablet Take 75 mg by mouth daily.     Yes Historical Provider, MD  HYDROcodone-acetaminophen [facility]) 10-325 MG per tablet  05/19/11  Yes Historical Provider, MD  lisinopril (PRINIVIL,ZESTRIL) 10 MG tablet Take 10 mg by mouth daily.     Yes Historical Provider, MD  Multiple Vitamin (MULTIVITAMIN) tablet Take 1 tablet by mouth daily.     Yes Historical Provider, MD  nortriptyline (PAMELOR) 25 MG capsule Take 25 mg by mouth at bedtime.     Yes Historical Provider, MD  Omega-3 Fatty Acids (FISH OIL) 1000 MG CAPS Take 1 capsule by mouth daily.     Yes Historical Provider, MD  pioglitazone (ACTOS) 45 MG tablet Take 45 mg by mouth daily.     Yes Historical Provider, MD  simvastatin (ZOCOR) 80 MG tablet Take 80 mg by mouth at bedtime.     Yes Historical Provider, MD  temazepam (RESTORIL) 30 MG capsule Take 30 mg by mouth daily.     Yes Historical Provider, MD  VIAGRA 50 MG tablet  04/06/11  Yes Historical Provider, MD    No Known Allergies    Past medical history,  social history, and family history reviewed and updated.  ROS: Denies chest discomfort, orthopnea, PND, exertional dyspnea, palpitations, lightheadedness or syncope.  He notes no ankle edema.  PHYSICAL EXAM: BP 162/90  Pulse 84  Ht 5\' 10"  (1.778 m)  Wt 98.031 kg (216 lb 1.9 oz)  BMI 31.01 kg/m2 ; repeat blood pressure-170/95 General-Well developed; no acute distress Body habitus-overweight Neck-No JVD; no carotid bruits Lungs-clear lung fields; resonant to percussion Cardiovascular-normal PMI; normal S1 and S2 Abdomen-normal bowel sounds; soft and non-tender without masses or organomegaly Musculoskeletal-No deformities, no cyanosis or clubbing Neurologic-Normal cranial nerves; symmetric strength and tone Skin-Warm, no significant lesions; tan skin-uses tanning lamps Extremities-distal pulses-1+; 1/2+ ankle edema  Rhythm Strip: Normal sinus rhythm at a rate of 80 bpm; borderline 1st degree AV block; low voltage.  ASSESSMENT AND PLAN:  Benton Bing, MD 05/21/2011 2:59 PM

## 2011-05-21 NOTE — Patient Instructions (Addendum)
Your physician recommends that you schedule a follow-up appointment in: 1 year and Blood Pressure Check in 1 month  Your physician has recommended you make the following change in your medication:  1 - START Metoprolol 50 mg daily 2 - INCREASE lisinopril to 20 mg daily  Your physician has requested that you regularly monitor and record your blood pressure readings at home. Please use the same machine at the same time of day to check your readings and record them to bring to your follow-up visit.  CT scan of the Abdomen in 6 months to re-evaluate abdominal aneursym

## 2011-05-21 NOTE — Assessment & Plan Note (Signed)
Patient is experiencing significant back pain and wonders whether this might be contributing to blood pressure elevation.  I suggested Aleve as initial therapy.

## 2011-05-21 NOTE — Assessment & Plan Note (Signed)
Patient is maintained on clopidogrel by neurology following an event 4 years ago.  Is also being followed by [facility] neurology in conjunction with a study protocol.  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  Patient's neurologist will continue to manage this problem.

## 2011-05-21 NOTE — Assessment & Plan Note (Addendum)
Control of hypertension has been lost.  Metoprolol will be resumed at the previous dose of 50 mg per day and lisinopril dosage increased to 20 mg per day.  Patient will monitor blood pressure at home and return in one month for reassessment by the cardiology nurses.

## 2011-05-21 NOTE — Assessment & Plan Note (Signed)
Stable claudication with known disease and no significant compromise of limb viability or function.  No further intervention necessary at present.

## 2011-05-21 NOTE — Assessment & Plan Note (Signed)
[name] has done very well from a cardiac standpoint since surgery 6 years ago.  We will continue to work with him to optimally control cardiovascular risk factors.

## 2011-05-24 DIAGNOSIS — G5790 Unspecified mononeuropathy of unspecified lower limb: Secondary | ICD-10-CM | POA: Diagnosis not present

## 2011-05-24 DIAGNOSIS — G4700 Insomnia, unspecified: Secondary | ICD-10-CM | POA: Diagnosis not present

## 2011-05-24 DIAGNOSIS — G459 Transient cerebral ischemic attack, unspecified: Secondary | ICD-10-CM | POA: Diagnosis not present

## 2011-05-24 DIAGNOSIS — E785 Hyperlipidemia, unspecified: Secondary | ICD-10-CM | POA: Diagnosis not present

## 2011-06-21 ENCOUNTER — Telehealth: Payer: Self-pay | Admitting: *Deleted

## 2011-06-21 ENCOUNTER — Encounter: Payer: Medicare Other | Admitting: *Deleted

## 2011-06-21 NOTE — Telephone Encounter (Signed)
Received a call from patient stating that he would be unable to make his blood pressure check, however has been taking blood pressures at home with systolic pressures  In 120 - 140 range.  See scanned blood pressure record.

## 2011-06-23 NOTE — Telephone Encounter (Signed)
Results reviewed. Control of Htn is good. Continue current Rx.

## 2011-06-26 DIAGNOSIS — E782 Mixed hyperlipidemia: Secondary | ICD-10-CM | POA: Diagnosis not present

## 2011-06-26 DIAGNOSIS — Z79899 Other long term (current) drug therapy: Secondary | ICD-10-CM | POA: Diagnosis not present

## 2011-11-13 ENCOUNTER — Other Ambulatory Visit: Payer: Self-pay | Admitting: Family Medicine

## 2011-11-13 DIAGNOSIS — M545 Low back pain: Secondary | ICD-10-CM | POA: Diagnosis not present

## 2011-11-13 DIAGNOSIS — I259 Chronic ischemic heart disease, unspecified: Secondary | ICD-10-CM | POA: Diagnosis not present

## 2011-11-13 DIAGNOSIS — I6529 Occlusion and stenosis of unspecified carotid artery: Secondary | ICD-10-CM

## 2011-11-13 DIAGNOSIS — E785 Hyperlipidemia, unspecified: Secondary | ICD-10-CM | POA: Diagnosis not present

## 2011-11-13 DIAGNOSIS — I714 Abdominal aortic aneurysm, without rupture: Secondary | ICD-10-CM

## 2011-11-13 DIAGNOSIS — G589 Mononeuropathy, unspecified: Secondary | ICD-10-CM | POA: Diagnosis not present

## 2011-11-16 DIAGNOSIS — E782 Mixed hyperlipidemia: Secondary | ICD-10-CM | POA: Diagnosis not present

## 2011-11-16 DIAGNOSIS — Z79899 Other long term (current) drug therapy: Secondary | ICD-10-CM | POA: Diagnosis not present

## 2011-11-16 DIAGNOSIS — Z125 Encounter for screening for malignant neoplasm of prostate: Secondary | ICD-10-CM | POA: Diagnosis not present

## 2011-11-19 ENCOUNTER — Ambulatory Visit (HOSPITAL_COMMUNITY)
Admission: RE | Admit: 2011-11-19 | Discharge: 2011-11-19 | Disposition: A | Discharge status: Home / Self Care | Payer: Medicare Other | Source: Ambulatory Visit | Attending: Family Medicine | Admitting: Family Medicine

## 2011-11-19 DIAGNOSIS — I714 Abdominal aortic aneurysm, without rupture, unspecified: Secondary | ICD-10-CM | POA: Insufficient documentation

## 2011-11-19 DIAGNOSIS — I6529 Occlusion and stenosis of unspecified carotid artery: Secondary | ICD-10-CM | POA: Diagnosis not present

## 2011-11-19 DIAGNOSIS — I658 Occlusion and stenosis of other precerebral arteries: Secondary | ICD-10-CM | POA: Diagnosis not present

## 2012-01-04 DIAGNOSIS — M545 Low back pain: Secondary | ICD-10-CM | POA: Diagnosis not present

## 2012-02-15 DIAGNOSIS — Z23 Encounter for immunization: Secondary | ICD-10-CM | POA: Diagnosis not present

## 2012-05-16 ENCOUNTER — Other Ambulatory Visit: Payer: Self-pay | Admitting: Cardiology

## 2012-06-18 ENCOUNTER — Encounter: Payer: Self-pay | Admitting: Adult Health

## 2012-06-18 ENCOUNTER — Ambulatory Visit (INDEPENDENT_AMBULATORY_CARE_PROVIDER_SITE_OTHER): Payer: Medicare Other | Admitting: Adult Health

## 2012-06-18 VITALS — BP 142/86 | HR 90 | Resp 18 | Ht 70.0 in | Wt 220.1 lb

## 2012-06-18 DIAGNOSIS — I714 Abdominal aortic aneurysm, without rupture, unspecified: Secondary | ICD-10-CM

## 2012-06-18 DIAGNOSIS — I679 Cerebrovascular disease, unspecified: Secondary | ICD-10-CM

## 2012-06-18 DIAGNOSIS — I7090 Unspecified atherosclerosis: Secondary | ICD-10-CM

## 2012-06-18 DIAGNOSIS — I2510 Atherosclerotic heart disease of native coronary artery without angina pectoris: Secondary | ICD-10-CM

## 2012-06-18 DIAGNOSIS — I739 Peripheral vascular disease, unspecified: Secondary | ICD-10-CM

## 2012-06-18 DIAGNOSIS — I10 Essential (primary) hypertension: Secondary | ICD-10-CM

## 2012-06-18 NOTE — Progress Notes (Signed)
HPI: [name] is a 72 y/o patient of Dr. Dietrich Pates we are seeing for ongoing assessment and treatment of CAD, s/p CABG in 2008, AAA, and hypertension. He has other history to include cerebrovascular disease for which he continues on Plavix per neurology, hyperlipidemia, PVD followed by Dr. Hart Rochester, and degenerative joint disease.  He comes today without complaint of chest pain, DOE, but continues to have intermittent claudication symptoms of the left lower leg. He is followed by Dr. Gerda Diss for annual labs. He is due to see him next month.   No Known Allergies  Current Outpatient Prescriptions  Medication Sig Dispense Refill  . ALPRAZolam (XANAX) 0.5 MG tablet Take 0.5 mg by mouth as needed.        . clopidogrel (PLAVIX) 75 MG tablet Take 75 mg by mouth daily.        Marland Kitchen HYDROcodone-acetaminophen (NORCO) 10-325 MG per tablet       . lisinopril (PRINIVIL,ZESTRIL) 20 MG tablet take 1 tablet once daily  30 tablet  2  . Multiple Vitamin (MULTIVITAMIN) tablet Take 1 tablet by mouth daily.        . nortriptyline (PAMELOR) 25 MG capsule Take 25 mg by mouth at bedtime.        . Omega-3 Fatty Acids (FISH OIL) 1000 MG CAPS Take 1 capsule by mouth daily.        . pioglitazone (ACTOS) 45 MG tablet Take 45 mg by mouth daily.        . simvastatin (ZOCOR) 80 MG tablet Take 80 mg by mouth at bedtime.        . temazepam (RESTORIL) 30 MG capsule Take 30 mg by mouth daily.        Marland Kitchen VIAGRA 50 MG tablet       . metoprolol (TOPROL-XL) 50 MG 24 hr tablet Take 1 tablet (50 mg total) by mouth daily.  90 tablet  3    Past Medical History  Diagnosis Date  . Arteriosclerotic cardiovascular disease (ASCVD) 2006    CABG in 2006; normal EF  . Cerebrovascular disease 2007    hx of TIA in 08; duplex 60-80% R vertebral artery stenosis; moderate ASVD of the ICAs  . Peripheral vascular disease     Left femoral art. stent; bilateral renal art. stents-2007; ABIs-nl right; 0.80 left in 5/06; angio in 2007-95% bilat. renal;  diffuse aortoiliac ASCVD +ulceration;     . Hypertension     LVH  . Hyperlipidemia   . Tobacco abuse, in remission     [id number]  . Abdominal aortic aneurysm     3.5 cm in 2011  . Adenomatous polyps   . Diverticulosis   . Degenerative joint disease of spine 2011    Lumbosacral and cervical; ant. discectomy, fusion and plate in 4098  . DIABETES MELLITUS, TYPE II 01/31/2010    Past Surgical History  Procedure Date  . Coronary artery bypass graft 2006    LIMA to LAD; SVG to marginal; SVG to L posterolateral; nondoninant RCA   . Colonoscopy w/ polypectomy 2010  . Anterior fusion cervical spine 2005   . Lumbar spine surgery 1985     Discectomy and fusion    JXB:JYNWGN of systems complete and found to be negative unless listed above  PHYSICAL EXAM BP 142/86  Pulse 90  Resp 18  Ht 5\' 10"  (1.778 m)  Wt 220 lb 1.3 oz (99.828 kg)  BMI 31.58 kg/m2  SpO2 96%  General: Well developed, well nourished, in no acute  distress Head: Eyes PERRLA, No xanthomas.   Normal cephalic and atramatic  Lungs: Clear bilaterally to auscultation and percussion. Heart: HRRR S1 S2, without MRG.  Pulses are 2+ & equal.            No carotid bruit. No JVD.  No abdominal bruits. No femoral bruits. Abdomen: Bowel sounds are positive, abdomen soft and non-tender without masses or                  Hernia's noted. Msk:  Back normal, normal gait. Normal strength and tone for age. Extremities: No clubbing, cyanosis or edema.  DP +1 Neuro: Alert and oriented X 3. Psych:  Good affect, responds appropriately  EKG: NSr LAFB, evidence of inferior infarct. Rate of 90 bpm.  ASSESSMENT AND PLAN

## 2012-06-18 NOTE — Assessment & Plan Note (Signed)
He wishes to stop Plavix. I have advised him to check with neurologist before doing so, as they placed him on this medication in the setting of a CVA.

## 2012-06-18 NOTE — Assessment & Plan Note (Signed)
Most recent evaluation with CT scan completed in 11/2011 demonstrated AP maximum dimension measuring 4.1 cm, previously 3.5 cm.  No surgical intervention until diameter measures 5.5 or greater. Continue annual evaluations with CT scans. He is asymptomatic.

## 2012-06-18 NOTE — Progress Notes (Deleted)
Name: [name]    DOB: February 02, 1941  Age: 72 y.o.  MR#: [id number]       PCP:  Zachary Asa, MD      Insurance: @PAYORNAME @   CC:   No chief complaint on file.   VS BP 142/86  Pulse 90  Resp 18  Ht 5\' 10"  (1.778 m)  Wt 220 lb 1.3 oz (99.828 kg)  BMI 31.58 kg/m2  SpO2 96%  Weights Current Weight  06/18/12 220 lb 1.3 oz (99.828 kg)  05/21/11 216 lb 1.9 oz (98.031 kg)  01/31/10 206 lb (93.441 kg)    Blood Pressure  BP Readings from Last 3 Encounters:  06/18/12 142/86  05/21/11 162/90  01/31/10 129/74     Admit date:  (Not on file) Last encounter with RMR:  Visit date not found   Allergy No Known Allergies  Current Outpatient Prescriptions  Medication Sig Dispense Refill  . ALPRAZolam (XANAX) 0.5 MG tablet Take 0.5 mg by mouth as needed.        . clopidogrel (PLAVIX) 75 MG tablet Take 75 mg by mouth daily.        Marland Kitchen HYDROcodone-acetaminophen (NORCO) 10-325 MG per tablet       . lisinopril (PRINIVIL,ZESTRIL) 20 MG tablet take 1 tablet once daily  30 tablet  2  . Multiple Vitamin (MULTIVITAMIN) tablet Take 1 tablet by mouth daily.        . nortriptyline (PAMELOR) 25 MG capsule Take 25 mg by mouth at bedtime.        . Omega-3 Fatty Acids (FISH OIL) 1000 MG CAPS Take 1 capsule by mouth daily.        . pioglitazone (ACTOS) 45 MG tablet Take 45 mg by mouth daily.        . simvastatin (ZOCOR) 80 MG tablet Take 80 mg by mouth at bedtime.        . temazepam (RESTORIL) 30 MG capsule Take 30 mg by mouth daily.        Marland Kitchen VIAGRA 50 MG tablet       . metoprolol (TOPROL-XL) 50 MG 24 hr tablet Take 1 tablet (50 mg total) by mouth daily.  90 tablet  3    Discontinued Meds:   There are no discontinued medications.  Patient Active Problem List  Diagnosis  . Diabetes mellitus type II  . Abdominal aortic aneurysm  . PERIPHERAL VASCULAR DISEASE  . Arteriosclerotic cardiovascular disease (ASCVD)  . Cerebrovascular disease  . Hypertension  . Hyperlipidemia  . Tobacco abuse, in  remission  . Adenomatous polyps  . Degenerative joint disease of spine    LABS No visits with results within 3 Month(s) from this visit. Latest known visit with results is:  Admission on 03/02/2010, Discharged on 03/03/2010  Component Date Value  . Sodium 02/27/2010 139   . Potassium 02/27/2010 5.2*  . Chloride 02/27/2010 103   . CO2 02/27/2010 30   . Glucose, Bld 02/27/2010 91   . BUN 02/27/2010 10   . Creatinine, Ser 02/27/2010 1.00   . Calcium 02/27/2010 9.8   . GFR calc non Af Amer 02/27/2010 >60   . GFR calc Af Amer 02/27/2010                     Value:>60                                The eGFR has been  calculated                         using the MDRD equation.                         This calculation has not been                         validated in all clinical                         situations.                         eGFR's persistently                         <60 mL/min signify                         possible Chronic Kidney Disease.  . WBC 02/27/2010 5.3   . RBC 02/27/2010 4.68   . Hemoglobin 02/27/2010 15.7   . HCT 02/27/2010 45.9   . MCV 02/27/2010 98.1   . [facility] 02/27/2010 33.5   . MCHC 02/27/2010 34.2   . RDW 02/27/2010 13.1   . Platelets 02/27/2010 146*  . MRSA, PCR 02/27/2010 NEGATIVE   . Staphylococcus aureus 02/27/2010                     Value:NEGATIVE                                The Xpert SA Assay (FDA                         approved for NASAL specimens                         only), is one component of                         a comprehensive surveillance                         program.  It is not intended                         to diagnose infection nor to                         guide or monitor treatment.     Results for this Opt Visit:     Results for orders placed during the hospital encounter of 03/02/10  BASIC METABOLIC PANEL      Component Value Range   Sodium 139  135 - 145 mEq/L   Potassium 5.2 (*) 3.5 - 5.1 mEq/L    Chloride 103  96 - 112 mEq/L   CO2 30  19 - 32 mEq/L   Glucose, Bld 91  70 - 99 mg/dL   BUN 10  6 - 23 mg/dL   Creatinine, Ser 1.61  0.4 - 1.5 mg/dL   Calcium 9.8  8.4 - 09.6 mg/dL   GFR calc non  Af Amer >60  >60 mL/min   GFR calc Af Amer    >60 mL/min   Value: >60            The eGFR has been calculated     using the MDRD equation.     This calculation has not been     validated in all clinical     situations.     eGFR's persistently     <60 mL/min signify     possible Chronic Kidney Disease.  CBC      Component Value Range   WBC 5.3  4.0 - 10.5 K/uL   RBC 4.68  4.22 - 5.81 MIL/uL   Hemoglobin 15.7  13.0 - 17.0 g/dL   HCT 16.1  09.6 - 04.5 %   MCV 98.1  78.0 - 100.0 fL   MCH 33.5  26.0 - 34.0 pg   MCHC 34.2  30.0 - 36.0 g/dL   RDW 40.9  81.1 - 91.4 %   Platelets 146 (*) 150 - 400 K/uL  SURGICAL PCR SCREEN      Component Value Range   MRSA, PCR NEGATIVE  NEGATIVE   Staphylococcus aureus    NEGATIVE   Value: NEGATIVE            The Xpert SA Assay (FDA     approved for NASAL specimens     only), is one component of     a comprehensive surveillance     program.  It is not intended     to diagnose infection nor to     guide or monitor treatment.    EKG Orders placed in visit on 06/18/12  . EKG 12-LEAD     Prior Assessment and Plan Problem List as of 06/18/2012          Diabetes mellitus type II   Abdominal aortic aneurysm   Last Assessment & Plan Note   05/21/2011 Office Visit Signed 05/21/2011  3:25 PM by Zachary Brunswick, MD    Minimal aortic aneurysm when last assessed 18 months ago.  A repeat CT scan without contrast will be obtained in 6 months.  As long as the aneurysm is less than 4 cm, Q. 24 months imaging studies should be adequate.    PERIPHERAL VASCULAR DISEASE   Last Assessment & Plan Note   05/21/2011 Office Visit Signed 05/21/2011  3:32 PM by Zachary Brunswick, MD    Stable claudication with known disease and no significant compromise of limb viability or  function.  No further intervention necessary at present.    Arteriosclerotic cardiovascular disease (ASCVD)   Last Assessment & Plan Note   05/21/2011 Office Visit Signed 05/21/2011  3:25 PM by Zachary Brunswick, MD    [name] has done very well from a cardiac standpoint since surgery 6 years ago.  We will continue to work with him to optimally control cardiovascular risk factors.    Cerebrovascular disease   Last Assessment & Plan Note   05/21/2011 Office Visit Signed 05/21/2011  3:27 PM by Zachary Brunswick, MD    Patient is maintained on clopidogrel by neurology following an event 4 years ago.  Is also being followed by [facility] neurology in conjunction with a study protocol.  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  Patient's neurologist will continue to manage this problem.    Hypertension   Last Assessment & Plan Note   05/21/2011 Office Visit Addendum 05/21/2011  3:31 PM by Molly Maduro  Rosebud Poles, MD    Control of hypertension has been lost.  Metoprolol will be resumed at the previous dose of 50 mg per day and lisinopril dosage increased to 20 mg per day.  Patient will monitor blood pressure at home and return in one month for reassessment by the cardiology nurses.    Hyperlipidemia   Last Assessment & Plan Note   05/21/2011 Office Visit Signed 05/21/2011  3:28 PM by Zachary Brunswick, MD    Excellent control of hyperlipidemia; current therapy will be continued.    Tobacco abuse, in remission   Adenomatous polyps   Degenerative joint disease of spine   Last Assessment & Plan Note   05/21/2011 Office Visit Signed 05/21/2011  3:28 PM by Zachary Brunswick, MD    Patient is experiencing significant back pain and wonders whether this might be contributing to blood pressure elevation.  I suggested Aleve as initial therapy.        Imaging: No results found.   FRS Calculation: Score not calculated. Missing: Total Cholesterol

## 2012-06-18 NOTE — Assessment & Plan Note (Signed)
He is asymptomatic at this time. No changes in medication regimen. Will see him in one year unless symptomatic.

## 2012-06-18 NOTE — Patient Instructions (Addendum)
Your physician recommends that you schedule a follow-up appointment in: ONE YEAR WITH KL  Your physician recommends that you return for lab work WITH MD [facility] NEXT WEEK, PLEASE HAVE THEM FAX [place] A COPY TO [phone]

## 2012-06-18 NOTE — Assessment & Plan Note (Signed)
Followed by [facility]. No intervention planned at this time.

## 2012-06-18 NOTE — Assessment & Plan Note (Signed)
Excellent control of BP at this time. Continue current medications. Labs per Dr. Gerda Diss next month. Will request copies.

## 2012-07-09 ENCOUNTER — Telehealth: Payer: Self-pay | Admitting: *Deleted

## 2012-07-09 NOTE — Telephone Encounter (Signed)
FYI: pt seen in office 06-18-12 advised he would have labs drawn the following week with PCP [facility], contacted office and was advised by [facility] LPN pt no showed/cancelled this apt, unable to reach pt via telephone to clarify

## 2012-07-10 NOTE — Telephone Encounter (Signed)
Continue to try to get a hold of him. Have labs drawn as ordered, even without PCP visit.

## 2012-07-15 NOTE — Telephone Encounter (Signed)
Please clarify labs to have drawn for pt

## 2012-07-16 NOTE — Telephone Encounter (Signed)
Noted pt in bed per wife, she will advise for him to call office at earliest [facility]

## 2012-07-16 NOTE — Telephone Encounter (Signed)
Labs: CMET, Fasting lipids, CBC

## 2012-07-16 NOTE — Telephone Encounter (Signed)
Pt called to advise that he will contact PCP office to schedule his CPE asap, contacted PCP office while note still pending and noted office rep noted pt had just scheduled apt and requested lab orders needed to be drawn for pt be faxed to their office, faxed manually, will contact office for update on labs once drawn.

## 2012-07-23 DIAGNOSIS — I739 Peripheral vascular disease, unspecified: Secondary | ICD-10-CM | POA: Diagnosis not present

## 2012-07-23 DIAGNOSIS — I259 Chronic ischemic heart disease, unspecified: Secondary | ICD-10-CM | POA: Diagnosis not present

## 2012-07-23 DIAGNOSIS — I10 Essential (primary) hypertension: Secondary | ICD-10-CM | POA: Diagnosis not present

## 2012-07-23 DIAGNOSIS — E785 Hyperlipidemia, unspecified: Secondary | ICD-10-CM | POA: Diagnosis not present

## 2012-07-29 ENCOUNTER — Other Ambulatory Visit: Payer: Self-pay | Admitting: Family Medicine

## 2012-07-29 DIAGNOSIS — Z79899 Other long term (current) drug therapy: Secondary | ICD-10-CM | POA: Diagnosis not present

## 2012-07-29 DIAGNOSIS — E782 Mixed hyperlipidemia: Secondary | ICD-10-CM | POA: Diagnosis not present

## 2012-07-29 DIAGNOSIS — Z125 Encounter for screening for malignant neoplasm of prostate: Secondary | ICD-10-CM | POA: Diagnosis not present

## 2012-07-29 LAB — BASIC METABOLIC PANEL
BUN: 19 mg/dL (ref 6–23)
CO2: 28 mEq/L (ref 19–32)
Calcium: 9.6 mg/dL (ref 8.4–10.5)
Chloride: 104 mEq/L (ref 96–112)
Creat: 1.26 mg/dL (ref 0.50–1.35)
Glucose, Bld: 97 mg/dL (ref 70–99)
Potassium: 5.3 mEq/L (ref 3.5–5.3)
Sodium: 138 mEq/L (ref 135–145)

## 2012-07-29 LAB — HEPATIC FUNCTION PANEL
ALT: 12 U/L (ref 0–53)
AST: 17 U/L (ref 0–37)
Albumin: 4.1 g/dL (ref 3.5–5.2)
Alkaline Phosphatase: 69 U/L (ref 39–117)
Bilirubin, Direct: 0.1 mg/dL (ref 0.0–0.3)
Indirect Bilirubin: 0.4 mg/dL (ref 0.0–0.9)
Total Bilirubin: 0.5 mg/dL (ref 0.3–1.2)
Total Protein: 6.5 g/dL (ref 6.0–8.3)

## 2012-07-29 LAB — LIPID PANEL
Cholesterol: 126 mg/dL (ref 0–200)
HDL: 36 mg/dL — ABNORMAL LOW (ref >39)
LDL Cholesterol: 67 mg/dL (ref 0–99)
Total CHOL/HDL Ratio: 3.5 Ratio
Triglycerides: 117 mg/dL (ref <150)
VLDL: 23 mg/dL (ref 0–40)

## 2012-07-29 LAB — PSA, MEDICARE: PSA: 0.5 ng/mL (ref <=4.00)

## 2012-08-06 ENCOUNTER — Telehealth: Payer: Self-pay | Admitting: *Deleted

## 2012-08-06 DIAGNOSIS — I10 Essential (primary) hypertension: Secondary | ICD-10-CM

## 2012-08-06 DIAGNOSIS — E782 Mixed hyperlipidemia: Secondary | ICD-10-CM

## 2012-08-06 NOTE — Telephone Encounter (Signed)
Contacted pt PCP [facility] office to see if pt did finally scheduled his OV with labs, per contacted 07-10-12 concerning labs to be drawn, was advised pt did come into office on 07-23-12 and that the fax was received by this nurse to have specific labs drawn once pt has apt, however staff advised the labs were not drawn as requested, please advise if pt needs to have labs drawn soon or can wait til f/u with our office

## 2012-08-08 NOTE — Telephone Encounter (Signed)
Noted pt recall in chart for 06-18-13, placed orders in chart for labs to be drawn in the future, noted reminder sent to mail letter/slips to pt prior to f/u OV

## 2012-08-08 NOTE — Telephone Encounter (Signed)
Needs labs before his next office visit with [place]. Lipids and LFTs and BMET.

## 2012-08-18 ENCOUNTER — Other Ambulatory Visit: Payer: Self-pay | Admitting: Cardiology

## 2012-08-20 ENCOUNTER — Other Ambulatory Visit: Payer: Self-pay | Admitting: Family Medicine

## 2012-08-21 ENCOUNTER — Encounter (INDEPENDENT_AMBULATORY_CARE_PROVIDER_SITE_OTHER): Payer: Self-pay | Admitting: Neurology

## 2012-08-21 ENCOUNTER — Encounter: Payer: Self-pay | Admitting: Neurology

## 2012-08-21 DIAGNOSIS — I679 Cerebrovascular disease, unspecified: Secondary | ICD-10-CM

## 2012-08-21 NOTE — Progress Notes (Deleted)
This encounter was created in error - please disregard.

## 2012-08-21 NOTE — Progress Notes (Signed)
  Participant in the office today for his Exit Visit with [facility] Study. MMSE completed successfully.  Blood drawn and shipped. Last medication bottle returned to the [facility].

## 2012-09-15 ENCOUNTER — Other Ambulatory Visit: Payer: Self-pay | Admitting: Family Medicine

## 2012-10-03 ENCOUNTER — Other Ambulatory Visit: Payer: Self-pay | Admitting: Family Medicine

## 2012-10-07 NOTE — Telephone Encounter (Signed)
Seen July 23, 2012 for check up

## 2012-10-07 NOTE — Telephone Encounter (Signed)
wis five refills

## 2012-10-21 ENCOUNTER — Encounter: Payer: Self-pay | Admitting: *Deleted

## 2012-11-06 ENCOUNTER — Other Ambulatory Visit: Payer: Self-pay | Admitting: Family Medicine

## 2012-11-07 NOTE — Telephone Encounter (Signed)
Rx called into pharmacy voice mail.

## 2012-11-07 NOTE — Telephone Encounter (Signed)
Ok with five ref 

## 2012-11-15 ENCOUNTER — Other Ambulatory Visit: Payer: Self-pay | Admitting: Cardiology

## 2012-11-17 NOTE — Telephone Encounter (Signed)
Medication sent via escribe.  

## 2012-11-20 ENCOUNTER — Other Ambulatory Visit: Payer: Self-pay | Admitting: Family Medicine

## 2013-01-21 ENCOUNTER — Other Ambulatory Visit: Payer: Self-pay | Admitting: *Deleted

## 2013-01-28 ENCOUNTER — Encounter: Payer: Self-pay | Admitting: Family Medicine

## 2013-01-28 ENCOUNTER — Ambulatory Visit (INDEPENDENT_AMBULATORY_CARE_PROVIDER_SITE_OTHER): Payer: Medicare Other | Admitting: Family Medicine

## 2013-01-28 VITALS — BP 128/88 | Ht 70.0 in | Wt 209.2 lb

## 2013-01-28 DIAGNOSIS — I679 Cerebrovascular disease, unspecified: Secondary | ICD-10-CM

## 2013-01-28 DIAGNOSIS — Z79899 Other long term (current) drug therapy: Secondary | ICD-10-CM | POA: Diagnosis not present

## 2013-01-28 DIAGNOSIS — I10 Essential (primary) hypertension: Secondary | ICD-10-CM

## 2013-01-28 DIAGNOSIS — E782 Mixed hyperlipidemia: Secondary | ICD-10-CM | POA: Diagnosis not present

## 2013-01-28 DIAGNOSIS — I714 Abdominal aortic aneurysm, without rupture: Secondary | ICD-10-CM

## 2013-01-28 DIAGNOSIS — E785 Hyperlipidemia, unspecified: Secondary | ICD-10-CM

## 2013-01-28 DIAGNOSIS — Z23 Encounter for immunization: Secondary | ICD-10-CM

## 2013-01-28 DIAGNOSIS — G4700 Insomnia, unspecified: Secondary | ICD-10-CM

## 2013-01-28 DIAGNOSIS — I739 Peripheral vascular disease, unspecified: Secondary | ICD-10-CM

## 2013-01-28 MED ORDER — LISINOPRIL 20 MG PO TABS: 20.0000 mg | ORAL_TABLET | Freq: Two times a day (BID) | ORAL | Status: DC

## 2013-01-28 NOTE — Progress Notes (Signed)
  Subjective:    Patient ID: [name], male    DOB: Nov 02, 1940, 72 y.o.   MRN: [id number]  Hypertension This is a chronic problem. The current episode started more than 1 year ago. The problem is unchanged. The problem is controlled. There are no associated agents to hypertension. There are no known risk factors for coronary artery disease. Treatments tried: lisinopril. The current treatment provides moderate improvement. There are no compliance problems.   Wants to discuss increasing blood pressure medication. At times when checking blood pressure elsewhere the numbers often appear up.  Claims compliance with her cholesterol medicine. Trying to watch fats in the diet. Exercising some but not as much as he hoped.  Reports of the sleeping pills still helping him he definitely needs it.  No excessive chest pain or shortness of breath.  Results for orders placed in visit on 07/29/12  HEPATIC FUNCTION PANEL      Result Value Range   Total Bilirubin 0.5  0.3 - 1.2 mg/dL   Bilirubin, Direct 0.1  0.0 - 0.3 mg/dL   Indirect Bilirubin 0.4  0.0 - 0.9 mg/dL   Alkaline Phosphatase 69  39 - 117 U/L   AST 17  0 - 37 U/L   ALT 12  0 - 53 U/L   Total Protein 6.5  6.0 - 8.3 g/dL   Albumin 4.1  3.5 - 5.2 g/dL    Results for orders placed in visit on 01/28/13  LIPID PANEL      Result Value Range   Cholesterol 87  0 - 200 mg/dL   Triglycerides 782  <956 mg/dL   HDL 32 (*) >21 mg/dL   Total CHOL/HDL Ratio 2.7     VLDL 22  0 - 40 mg/dL   LDL Cholesterol 33  0 - 99 mg/dL  HEPATIC FUNCTION PANEL      Result Value Range   Total Bilirubin 0.8  0.3 - 1.2 mg/dL   Bilirubin, Direct 0.2  0.0 - 0.3 mg/dL   Indirect Bilirubin 0.6  0.0 - 0.9 mg/dL   Alkaline Phosphatase 91  39 - 117 U/L   AST 19  0 - 37 U/L   ALT 20  0 - 53 U/L   Total Protein 6.6  6.0 - 8.3 g/dL   Albumin 3.9  3.5 - 5.2 g/dL      Review of Systems ROS otherwise negative no chest pain no back pain no abdominal pain no change  in bowel habits no blood in stools    Objective:   Physical Exam  Alert no acute distress. Lungs clear diminished breath sounds somewhat no wheezes or crackles. Heart regular rate and rhythm. Ankles without edema.      Assessment & Plan:  Impression #1 hypertension. Suboptimum control. #2 hyperlipidemia status uncertain. #3 insomnia discussed. #4 coronary artery disease clinically silent at this time plan plan appropriate blood work. Medications refilled. Diet exercise discussed. Further recommendations based on blood work. Double up to lisinopril dose rationale discussed. Flu shot. WSL

## 2013-01-29 DIAGNOSIS — G4700 Insomnia, unspecified: Secondary | ICD-10-CM | POA: Insufficient documentation

## 2013-01-29 LAB — HEPATIC FUNCTION PANEL
ALT: 20 U/L (ref 0–53)
AST: 19 U/L (ref 0–37)
Albumin: 3.9 g/dL (ref 3.5–5.2)
Alkaline Phosphatase: 91 U/L (ref 39–117)
Bilirubin, Direct: 0.2 mg/dL (ref 0.0–0.3)
Indirect Bilirubin: 0.6 mg/dL (ref 0.0–0.9)
Total Bilirubin: 0.8 mg/dL (ref 0.3–1.2)
Total Protein: 6.6 g/dL (ref 6.0–8.3)

## 2013-01-29 LAB — LIPID PANEL
Cholesterol: 87 mg/dL (ref 0–200)
HDL: 32 mg/dL — ABNORMAL LOW (ref >39)
LDL Cholesterol: 33 mg/dL (ref 0–99)
Total CHOL/HDL Ratio: 2.7 Ratio
Triglycerides: 110 mg/dL (ref <150)
VLDL: 22 mg/dL (ref 0–40)

## 2013-02-03 DIAGNOSIS — E785 Hyperlipidemia, unspecified: Secondary | ICD-10-CM | POA: Insufficient documentation

## 2013-02-17 ENCOUNTER — Other Ambulatory Visit: Payer: Self-pay | Admitting: Family Medicine

## 2013-02-17 ENCOUNTER — Other Ambulatory Visit: Payer: Self-pay | Admitting: Cardiology

## 2013-02-17 ENCOUNTER — Encounter: Payer: Self-pay | Admitting: Family Medicine

## 2013-03-16 ENCOUNTER — Other Ambulatory Visit: Payer: Self-pay | Admitting: Family Medicine

## 2013-03-21 ENCOUNTER — Other Ambulatory Visit: Payer: Self-pay | Admitting: Family Medicine

## 2013-04-06 DIAGNOSIS — D2110 Benign neoplasm of connective and other soft tissue of unspecified upper limb, including shoulder: Secondary | ICD-10-CM | POA: Diagnosis not present

## 2013-04-06 DIAGNOSIS — L570 Actinic keratosis: Secondary | ICD-10-CM | POA: Diagnosis not present

## 2013-04-06 DIAGNOSIS — D235 Other benign neoplasm of skin of trunk: Secondary | ICD-10-CM | POA: Diagnosis not present

## 2013-04-06 DIAGNOSIS — M7130 Other bursal cyst, unspecified site: Secondary | ICD-10-CM | POA: Diagnosis not present

## 2013-04-06 DIAGNOSIS — D2330 Other benign neoplasm of skin of unspecified part of face: Secondary | ICD-10-CM | POA: Diagnosis not present

## 2013-04-12 ENCOUNTER — Other Ambulatory Visit: Payer: Self-pay | Admitting: Family Medicine

## 2013-04-13 NOTE — Telephone Encounter (Signed)
Ok plus 5 ref 

## 2013-04-16 ENCOUNTER — Encounter: Payer: Self-pay | Admitting: *Deleted

## 2013-04-16 ENCOUNTER — Other Ambulatory Visit: Payer: Self-pay | Admitting: *Deleted

## 2013-04-16 DIAGNOSIS — I10 Essential (primary) hypertension: Secondary | ICD-10-CM

## 2013-04-16 DIAGNOSIS — E782 Mixed hyperlipidemia: Secondary | ICD-10-CM

## 2013-05-20 ENCOUNTER — Other Ambulatory Visit: Payer: Self-pay | Admitting: Family Medicine

## 2013-07-28 ENCOUNTER — Encounter: Payer: Self-pay | Admitting: Family Medicine

## 2013-07-28 ENCOUNTER — Ambulatory Visit (INDEPENDENT_AMBULATORY_CARE_PROVIDER_SITE_OTHER): Payer: Medicare Other | Admitting: Family Medicine

## 2013-07-28 VITALS — BP 138/92 | Ht 69.0 in | Wt 209.0 lb

## 2013-07-28 DIAGNOSIS — I714 Abdominal aortic aneurysm, without rupture, unspecified: Secondary | ICD-10-CM

## 2013-07-28 DIAGNOSIS — I7090 Unspecified atherosclerosis: Secondary | ICD-10-CM

## 2013-07-28 DIAGNOSIS — E785 Hyperlipidemia, unspecified: Secondary | ICD-10-CM

## 2013-07-28 DIAGNOSIS — I10 Essential (primary) hypertension: Secondary | ICD-10-CM | POA: Diagnosis not present

## 2013-07-28 DIAGNOSIS — Z125 Encounter for screening for malignant neoplasm of prostate: Secondary | ICD-10-CM | POA: Diagnosis not present

## 2013-07-28 DIAGNOSIS — I2510 Atherosclerotic heart disease of native coronary artery without angina pectoris: Secondary | ICD-10-CM

## 2013-07-28 DIAGNOSIS — Z79899 Other long term (current) drug therapy: Secondary | ICD-10-CM

## 2013-07-28 DIAGNOSIS — M479 Spondylosis, unspecified: Secondary | ICD-10-CM

## 2013-07-28 MED ORDER — TEMAZEPAM 30 MG PO CAPS: ORAL_CAPSULE | ORAL | Status: DC

## 2013-07-28 MED ORDER — SIMVASTATIN 80 MG PO TABS: ORAL_TABLET | ORAL | Status: DC

## 2013-07-28 MED ORDER — CLOPIDOGREL BISULFATE 75 MG PO TABS: ORAL_TABLET | ORAL | Status: DC

## 2013-07-28 MED ORDER — SILDENAFIL CITRATE 50 MG PO TABS: 50.0000 mg | ORAL_TABLET | ORAL | Status: DC | PRN

## 2013-07-28 MED ORDER — NORTRIPTYLINE HCL 25 MG PO CAPS: ORAL_CAPSULE | ORAL | Status: DC

## 2013-07-28 MED ORDER — ALPRAZOLAM 0.5 MG PO TABS: 0.5000 mg | ORAL_TABLET | ORAL | Status: DC | PRN

## 2013-07-28 MED ORDER — LISINOPRIL 20 MG PO TABS: 20.0000 mg | ORAL_TABLET | Freq: Two times a day (BID) | ORAL | Status: DC

## 2013-07-28 NOTE — Progress Notes (Signed)
   Subjective:    Patient ID: [name], male    DOB: 11-27-1940, 73 y.o.   MRN: [id number]  HPI Patient is here today for a check up.   He needs to get BW done.   He also needs for you to check his left wrist. He went to a dermatologist 2 months ago to have a cyst removed, but it keeps coming back. He did not go back to the dermatologist.  Goes to y and does the work out  No recent chest pain, hx of heart trouble  Some leg pain when he walks significant distances. Primarily in. Known history of peripheral arterial disease.  Claims compliance with all of his medications.  Trying to watch his diet has cut salt down. Has cut the fats down.  Ongoing trouble sleeping. States he definitely needs her medication.   Review of Systems No current chest pain headache back pain abdominal pain change in bowel habits blood in stool ROS otherwise negative    Objective:   Physical Exam  alert no apparent distress. H&T normal. Lungs clear. Heart regular rate and rhythm. Ankles without edema. Left wrist cyst noted       Assessment & Plan:  Impression 1 coronary artery disease encouraged to get back and see his heart doctor. #2 hyperlipidemia status uncertain. #3 hypertension good control. #4 history stroke stable. #5 insomnia discussed #6 skin cyst encouraged to get back with dermatologist plan appropriate blood work. All meds refilled. Diet exercise discussed in encourage. Recheck every 6 months. WSL

## 2013-07-31 DIAGNOSIS — Z79899 Other long term (current) drug therapy: Secondary | ICD-10-CM | POA: Diagnosis not present

## 2013-07-31 DIAGNOSIS — Z125 Encounter for screening for malignant neoplasm of prostate: Secondary | ICD-10-CM | POA: Diagnosis not present

## 2013-07-31 DIAGNOSIS — I10 Essential (primary) hypertension: Secondary | ICD-10-CM | POA: Diagnosis not present

## 2013-07-31 DIAGNOSIS — E785 Hyperlipidemia, unspecified: Secondary | ICD-10-CM | POA: Diagnosis not present

## 2013-07-31 LAB — LIPID PANEL
Cholesterol: 103 mg/dL (ref 0–200)
HDL: 31 mg/dL — ABNORMAL LOW (ref >39)
LDL Cholesterol: 45 mg/dL (ref 0–99)
Total CHOL/HDL Ratio: 3.3 Ratio
Triglycerides: 137 mg/dL (ref <150)
VLDL: 27 mg/dL (ref 0–40)

## 2013-07-31 LAB — HEPATIC FUNCTION PANEL
ALT: 21 U/L (ref 0–53)
AST: 17 U/L (ref 0–37)
Albumin: 4 g/dL (ref 3.5–5.2)
Alkaline Phosphatase: 86 U/L (ref 39–117)
Bilirubin, Direct: 0.2 mg/dL (ref 0.0–0.3)
Indirect Bilirubin: 0.5 mg/dL (ref 0.2–1.2)
Total Bilirubin: 0.7 mg/dL (ref 0.2–1.2)
Total Protein: 6.6 g/dL (ref 6.0–8.3)

## 2013-07-31 LAB — BASIC METABOLIC PANEL
BUN: 17 mg/dL (ref 6–23)
CO2: 27 mEq/L (ref 19–32)
Calcium: 9.1 mg/dL (ref 8.4–10.5)
Chloride: 104 mEq/L (ref 96–112)
Creat: 0.96 mg/dL (ref 0.50–1.35)
Glucose, Bld: 102 mg/dL — ABNORMAL HIGH (ref 70–99)
Potassium: 5.2 mEq/L (ref 3.5–5.3)
Sodium: 141 mEq/L (ref 135–145)

## 2013-08-01 LAB — PSA, MEDICARE: PSA: 0.49 ng/mL (ref <=4.00)

## 2013-08-11 NOTE — Progress Notes (Signed)
Patient notified and verbalized understanding of the test results. No further questions. 

## 2013-08-12 ENCOUNTER — Other Ambulatory Visit: Payer: Self-pay | Admitting: Family Medicine

## 2013-08-12 DIAGNOSIS — M6740 Ganglion, unspecified site: Secondary | ICD-10-CM | POA: Diagnosis not present

## 2013-08-21 ENCOUNTER — Telehealth: Payer: Self-pay | Admitting: Family Medicine

## 2013-08-21 MED ORDER — ALPRAZOLAM 0.5 MG PO TABS: 0.5000 mg | ORAL_TABLET | ORAL | Status: DC | PRN

## 2013-08-21 NOTE — Telephone Encounter (Signed)
Last seen 07/28/13.

## 2013-08-21 NOTE — Telephone Encounter (Signed)
Patient left message on my voicemail, needs refill on his ALPRAZOLAM 0.5mg , please call when done

## 2013-08-21 NOTE — Telephone Encounter (Signed)
May refill x2 ?

## 2013-08-21 NOTE — Telephone Encounter (Signed)
Patient notified script will be faxed to [facility].

## 2013-08-27 DIAGNOSIS — M6740 Ganglion, unspecified site: Secondary | ICD-10-CM | POA: Diagnosis not present

## 2013-08-27 DIAGNOSIS — M1710 Unilateral primary osteoarthritis, unspecified knee: Secondary | ICD-10-CM | POA: Diagnosis not present

## 2013-09-10 DIAGNOSIS — M6740 Ganglion, unspecified site: Secondary | ICD-10-CM | POA: Diagnosis not present

## 2013-09-12 ENCOUNTER — Other Ambulatory Visit: Payer: Self-pay | Admitting: Family Medicine

## 2013-09-19 DIAGNOSIS — T148XXA Other injury of unspecified body region, initial encounter: Secondary | ICD-10-CM | POA: Diagnosis not present

## 2013-09-19 DIAGNOSIS — S59909A Unspecified injury of unspecified elbow, initial encounter: Secondary | ICD-10-CM | POA: Diagnosis not present

## 2013-09-19 DIAGNOSIS — S6990XA Unspecified injury of unspecified wrist, hand and finger(s), initial encounter: Secondary | ICD-10-CM | POA: Diagnosis not present

## 2013-09-19 DIAGNOSIS — M7989 Other specified soft tissue disorders: Secondary | ICD-10-CM | POA: Diagnosis not present

## 2013-09-19 DIAGNOSIS — S59919A Unspecified injury of unspecified forearm, initial encounter: Secondary | ICD-10-CM | POA: Diagnosis not present

## 2013-09-21 ENCOUNTER — Encounter: Payer: Self-pay | Admitting: Family Medicine

## 2013-09-21 ENCOUNTER — Ambulatory Visit (INDEPENDENT_AMBULATORY_CARE_PROVIDER_SITE_OTHER): Payer: Medicare Other | Admitting: Family Medicine

## 2013-09-21 VITALS — BP 118/76 | Ht 70.0 in | Wt 212.0 lb

## 2013-09-21 DIAGNOSIS — T148XXA Other injury of unspecified body region, initial encounter: Secondary | ICD-10-CM | POA: Diagnosis not present

## 2013-09-21 DIAGNOSIS — I2510 Atherosclerotic heart disease of native coronary artery without angina pectoris: Secondary | ICD-10-CM

## 2013-09-21 DIAGNOSIS — I7090 Unspecified atherosclerosis: Secondary | ICD-10-CM | POA: Diagnosis not present
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
## 2013-10-15 ENCOUNTER — Other Ambulatory Visit: Payer: Self-pay | Admitting: Family Medicine

## 2013-10-20 NOTE — Telephone Encounter (Signed)
May refill this +4 additional refills 

## 2013-11-07 DIAGNOSIS — J329 Chronic sinusitis, unspecified: Secondary | ICD-10-CM | POA: Diagnosis not present

## 2013-12-28 DIAGNOSIS — M6740 Ganglion, unspecified site: Secondary | ICD-10-CM | POA: Diagnosis not present

## 2014-01-11 ENCOUNTER — Other Ambulatory Visit (HOSPITAL_COMMUNITY): Payer: Self-pay | Admitting: Orthopaedic Surgery

## 2014-01-11 DIAGNOSIS — M6740 Ganglion, unspecified site: Secondary | ICD-10-CM | POA: Diagnosis not present

## 2014-01-12 ENCOUNTER — Encounter (HOSPITAL_COMMUNITY): Payer: Self-pay | Admitting: Pharmacy Technician

## 2014-01-14 ENCOUNTER — Other Ambulatory Visit (HOSPITAL_COMMUNITY): Payer: Self-pay | Admitting: *Deleted

## 2014-01-14 NOTE — Patient Instructions (Addendum)
20     Your procedure is scheduled on:  Friday 01/22/2014  Report to [facility] Main Entrance and follow signs to Short Stay  at   1241  PM.  Call this number if you have problems the night before or morning of surgery:   [phone]   Remember:          Do not eat food  AFTER MIDNIGHT!  MAY HAVE CLEAR LIQUIDS FROM MIDNIGHT UP UNTIL 0840 AM THEN NOTHING UNTIL AFTER SURGERY!  Take these medicines the morning of surgery with A SIP OF WATER: NONE    [facility] IS NOT RESPONSIBLE FOR ANY BELONGINGS OR VALUABLES BROUGHT TO HOSPITAL.  Marland Kitchen  Leave suitcase in the car. After surgery it may be brought to your room.  For patients admitted to the hospital, checkout time is 11:00 AM the day of              Discharge.    DO NOT WEAR  JEWELRY,MAKE-UP,LOTIONS,POWDERS,PERFUMES,CONTACTS , DENTURES OR BRIDGEWORK ,AND DO NOT WEAR FALSE EYELASHES                                    Patients discharged the day of surgery will not be allowed to drive home.  If going home the same day of surgery, must have someone stay with you first 24 hrs.at home and arrange for someone to drive you home from the  [place] YS:[id number]   Special Instructions:              Please read over the following fact sheets that you were given:             1. [place] - Preparing for Surgery Before surgery, you can play an important role.  Because skin is not sterile, your skin needs to be as free of germs as possible.  You can reduce the number of germs on your skin by washing with CHG (chlorahexidine gluconate) soap before surgery.  CHG is an antiseptic cleaner which kills germs and bonds with the skin to continue killing germs even after washing. Please DO NOT use if you have an allergy to CHG or antibacterial soaps.  If your skin becomes  reddened/irritated stop using the CHG and inform your nurse when you arrive at Short Stay. Do not shave (including legs and underarms) for at least 48 hours prior to the first CHG shower.  You may shave your face/neck. Please follow these instructions carefully:  1.  Shower with CHG Soap the night before surgery and the  morning of Surgery.  2.  If you choose to wash your hair, wash your hair first as usual with your  normal  shampoo.  3.  After you shampoo, rinse your hair and body thoroughly to remove the  shampoo.  4.  Use CHG as you would any other liquid soap.  You can apply chg directly  to the skin and wash                       Gently with a scrungie or clean washcloth.  5.  Apply the CHG Soap to your body ONLY FROM THE NECK DOWN.   Do not use on face/ open                           Wound or open sores. Avoid contact with eyes, ears mouth and genitals (private parts).                       Wash face,  Genitals (private parts) with your normal soap.             6.  Wash thoroughly, paying special attention to the area where your surgery  will be performed.  7.  Thoroughly rinse your body with warm water from the neck down.  8.  DO NOT shower/wash with your normal soap after using and rinsing off  the CHG Soap.                9.  Pat yourself dry with a clean towel.            10.  Wear clean pajamas.            11.  Place clean sheets on your bed the night of your first shower and do not  sleep with pets. Day of Surgery : Do not apply any lotions/deodorants the morning of surgery.  Please wear clean clothes to the hospital/surgery center.  FAILURE TO FOLLOW THESE INSTRUCTIONS MAY RESULT IN THE CANCELLATION OF YOUR SURGERY PATIENT SIGNATURE_________________________________  NURSE SIGNATURE__________________________________  ________________________________________________________________________   Adam Phenix  An incentive spirometer is a tool that  can help keep your lungs clear and active. This tool measures how well you are filling your lungs with each breath. Taking long deep breaths may help reverse or decrease the chance of developing breathing (pulmonary) problems (especially infection) following:  A long period of time when you are unable to move or be active. BEFORE THE PROCEDURE   If the spirometer includes an indicator to show your best effort, your nurse or respiratory therapist will set it to a desired goal.  If possible, sit up straight or lean slightly forward. Try not to slouch.  Hold the incentive spirometer in an upright position. INSTRUCTIONS FOR USE  1. Sit on the edge of your bed if possible, or sit up as far as you can in bed or on a chair. 2. Hold the incentive spirometer in an upright position. 3. Breathe out normally. 4. Place the mouthpiece in your mouth and seal your lips tightly around it. 5. Breathe in slowly and as deeply as possible, raising the piston or the ball toward the top of the column. 6. Hold your breath for 3-5 seconds or for as long as possible. Allow the piston or ball to fall to the bottom of the column. 7. Remove the mouthpiece from your mouth and breathe out normally. 8. Rest for a few seconds and repeat Steps 1 through 7 at least 10 times every 1-2 hours when you are awake. Take your time and take a few normal breaths between deep breaths. 9. The spirometer may include an indicator to show  your best effort. Use the indicator as a goal to work toward during each repetition. 10. After each set of 10 deep breaths, practice coughing to be sure your lungs are clear. If you have an incision (the cut made at the time of surgery), support your incision when coughing by placing a pillow or rolled up towels firmly against it. Once you are able to get out of bed, walk around indoors and cough well. You may stop using the incentive spirometer when instructed by your caregiver.  RISKS AND  COMPLICATIONS  Take your time so you do not get dizzy or light-headed.  If you are in pain, you may need to take or ask for pain medication before doing incentive spirometry. It is harder to take a deep breath if you are having pain. AFTER USE  Rest and breathe slowly and easily.  It can be helpful to keep track of a log of your progress. Your caregiver can provide you with a simple table to help with this. If you are using the spirometer at home, follow these instructions: [place] IF:   You are having difficultly using the spirometer.  You have trouble using the spirometer as often as instructed.  Your pain medication is not giving enough relief while using the spirometer.  You develop fever of 100.5 F (38.1 C) or higher. SEEK IMMEDIATE MEDICAL CARE IF:   You cough up bloody sputum that had not been present before.  You develop fever of 102 F (38.9 C) or greater.  You develop worsening pain at or near the incision site. MAKE SURE YOU:   Understand these instructions.  Will watch your condition.  Will get help right away if you are not doing well or get worse. Document Released: 09/10/2006 Document Revised: 07/23/2011 Document Reviewed: 11/11/2006 [facility] Patient Information 2014 [place], [place].   ________________________________________________________________________

## 2014-01-15 ENCOUNTER — Encounter (HOSPITAL_COMMUNITY): Payer: Self-pay

## 2014-01-15 ENCOUNTER — Ambulatory Visit (HOSPITAL_COMMUNITY)
Admission: RE | Admit: 2014-01-15 | Discharge: 2014-01-15 | Disposition: A | Discharge status: Home / Self Care | Payer: Medicare Other | Source: Ambulatory Visit | Attending: Anesthesiology | Admitting: Anesthesiology

## 2014-01-15 ENCOUNTER — Encounter (HOSPITAL_COMMUNITY)
Admission: RE | Admit: 2014-01-15 | Discharge: 2014-01-15 | Disposition: A | Discharge status: Home / Self Care | Payer: Medicare Other | Source: Ambulatory Visit | Attending: Orthopaedic Surgery | Admitting: Orthopaedic Surgery

## 2014-01-15 DIAGNOSIS — I10 Essential (primary) hypertension: Secondary | ICD-10-CM | POA: Diagnosis not present

## 2014-01-15 DIAGNOSIS — I771 Stricture of artery: Secondary | ICD-10-CM | POA: Diagnosis not present

## 2014-01-15 LAB — BASIC METABOLIC PANEL
Anion gap: 13 (ref 5–15)
BUN: 14 mg/dL (ref 6–23)
CO2: 24 mEq/L (ref 19–32)
Calcium: 9.8 mg/dL (ref 8.4–10.5)
Chloride: 99 mEq/L (ref 96–112)
Creatinine, Ser: 0.97 mg/dL (ref 0.50–1.35)
GFR calc Af Amer: >90 mL/min (ref >90)
GFR calc non Af Amer: 79 mL/min — ABNORMAL LOW (ref >90)
Glucose, Bld: 84 mg/dL (ref 70–99)
Potassium: 4.7 mEq/L (ref 3.7–5.3)
Sodium: 136 mEq/L — ABNORMAL LOW (ref 137–147)

## 2014-01-15 LAB — CBC
HCT: 44.8 % (ref 39.0–52.0)
Hemoglobin: 15.5 g/dL (ref 13.0–17.0)
MCH: 32.2 pg (ref 26.0–34.0)
MCHC: 34.6 g/dL (ref 30.0–36.0)
MCV: 93.1 fL (ref 78.0–100.0)
Platelets: 165 10*3/uL (ref 150–400)
RBC: 4.81 MIL/uL (ref 4.22–5.81)
RDW: 12.4 % (ref 11.5–15.5)
WBC: 7 10*3/uL (ref 4.0–10.5)

## 2014-01-15 NOTE — Progress Notes (Signed)
01/15/14 1342  OBSTRUCTIVE SLEEP APNEA  Have you ever been diagnosed with sleep apnea through a sleep study? No  Do you snore loudly (loud enough to be heard through closed doors)?  0  Do you often feel tired, fatigued, or sleepy during the daytime? 0  Has anyone observed you stop breathing during your sleep? 0  Do you have, or are you being treated for high blood pressure? 1  BMI more than 35 kg/m2? 0  Age over 75 years old? 1  Neck circumference greater than 40 cm/16 inches? 1  Gender: 1  Obstructive Sleep Apnea Score 4  Score 4 or greater  Results sent to PCP

## 2014-01-15 NOTE — Progress Notes (Signed)
Shown EKG results to Dr. Marcell Barlow with no new orders received- patient OK for surgery!

## 2014-01-22 ENCOUNTER — Encounter (HOSPITAL_COMMUNITY): Payer: Self-pay | Admitting: *Deleted

## 2014-01-22 ENCOUNTER — Ambulatory Visit (HOSPITAL_COMMUNITY)
Admission: RE | Admit: 2014-01-22 | Discharge: 2014-01-22 | Disposition: A | Discharge status: Home / Self Care | Payer: Medicare Other | Source: Ambulatory Visit | Attending: Orthopaedic Surgery | Admitting: Orthopaedic Surgery

## 2014-01-22 ENCOUNTER — Ambulatory Visit (HOSPITAL_COMMUNITY): Payer: Medicare Other | Admitting: Certified Registered Nurse Anesthetist

## 2014-01-22 ENCOUNTER — Encounter (HOSPITAL_COMMUNITY): Payer: Medicare Other | Admitting: Certified Registered Nurse Anesthetist

## 2014-01-22 ENCOUNTER — Encounter (HOSPITAL_COMMUNITY)
Admission: RE | Disposition: A | Discharge status: Home / Self Care | Payer: Self-pay | Source: Ambulatory Visit | Attending: Orthopaedic Surgery

## 2014-01-22 DIAGNOSIS — K5730 Diverticulosis of large intestine without perforation or abscess without bleeding: Secondary | ICD-10-CM | POA: Diagnosis not present

## 2014-01-22 DIAGNOSIS — D181 Lymphangioma, any site: Secondary | ICD-10-CM | POA: Diagnosis not present

## 2014-01-22 DIAGNOSIS — Z8673 Personal history of transient ischemic attack (TIA), and cerebral infarction without residual deficits: Secondary | ICD-10-CM | POA: Insufficient documentation

## 2014-01-22 DIAGNOSIS — Z87891 Personal history of nicotine dependence: Secondary | ICD-10-CM | POA: Insufficient documentation

## 2014-01-22 DIAGNOSIS — D4989 Neoplasm of unspecified behavior of other specified sites: Secondary | ICD-10-CM | POA: Diagnosis not present

## 2014-01-22 DIAGNOSIS — I2510 Atherosclerotic heart disease of native coronary artery without angina pectoris: Secondary | ICD-10-CM | POA: Diagnosis not present

## 2014-01-22 DIAGNOSIS — M6740 Ganglion, unspecified site: Secondary | ICD-10-CM | POA: Diagnosis not present

## 2014-01-22 DIAGNOSIS — Z951 Presence of aortocoronary bypass graft: Secondary | ICD-10-CM | POA: Diagnosis not present

## 2014-01-22 DIAGNOSIS — I10 Essential (primary) hypertension: Secondary | ICD-10-CM | POA: Insufficient documentation

## 2014-01-22 DIAGNOSIS — M259 Joint disorder, unspecified: Secondary | ICD-10-CM | POA: Diagnosis present

## 2014-01-22 DIAGNOSIS — I739 Peripheral vascular disease, unspecified: Secondary | ICD-10-CM | POA: Diagnosis not present

## 2014-01-22 DIAGNOSIS — E785 Hyperlipidemia, unspecified: Secondary | ICD-10-CM | POA: Diagnosis not present

## 2014-01-22 DIAGNOSIS — R2232 Localized swelling, mass and lump, left upper limb: Secondary | ICD-10-CM

## 2014-01-22 HISTORY — PX: MASS EXCISION: SHX2000

## 2014-01-22 LAB — GLUCOSE, CAPILLARY
Glucose-Capillary: 96 mg/dL (ref 70–99)
Glucose-Capillary: 96 mg/dL (ref 70–99)

## 2014-01-22 SURGERY — EXCISION MASS
Anesthesia: General | Site: Wrist | Laterality: Left

## 2014-01-22 MED ORDER — FENTANYL CITRATE 0.05 MG/ML IJ SOLN
25.0000 ug | INTRAMUSCULAR | Status: DC | PRN
  Administered 2014-01-22 (×4): 25 ug via INTRAVENOUS

## 2014-01-22 MED ORDER — LIDOCAINE HCL (CARDIAC) 20 MG/ML IV SOLN
INTRAVENOUS | Status: AC
  Filled 2014-01-22: qty 5

## 2014-01-22 MED ORDER — FENTANYL CITRATE 0.05 MG/ML IJ SOLN
INTRAMUSCULAR | Status: DC | PRN
  Administered 2014-01-22: 50 ug via INTRAVENOUS
  Administered 2014-01-22 (×2): 25 ug via INTRAVENOUS

## 2014-01-22 MED ORDER — HYDROCODONE-ACETAMINOPHEN 5-325 MG PO TABS: 1.0000 | ORAL_TABLET | ORAL | Status: DC | PRN

## 2014-01-22 MED ORDER — CEFAZOLIN SODIUM-DEXTROSE 2-3 GM-% IV SOLR
INTRAVENOUS | Status: AC
  Filled 2014-01-22: qty 50

## 2014-01-22 MED ORDER — FENTANYL CITRATE 0.05 MG/ML IJ SOLN
INTRAMUSCULAR | Status: DC
Start: 2014-01-22 — End: 2014-01-22
  Filled 2014-01-22: qty 2

## 2014-01-22 MED ORDER — PROPOFOL 10 MG/ML IV BOLUS
INTRAVENOUS | Status: DC | PRN
  Administered 2014-01-22: 170 mg via INTRAVENOUS

## 2014-01-22 MED ORDER — LACTATED RINGERS IV SOLN
INTRAVENOUS | Status: DC
  Administered 2014-01-22: 1000 mL via INTRAVENOUS

## 2014-01-22 MED ORDER — PHENYLEPHRINE 40 MCG/ML (10ML) SYRINGE FOR IV PUSH (FOR BLOOD PRESSURE SUPPORT)
PREFILLED_SYRINGE | INTRAVENOUS | Status: AC
  Filled 2014-01-22: qty 10

## 2014-01-22 MED ORDER — PHENYLEPHRINE HCL 10 MG/ML IJ SOLN
INTRAMUSCULAR | Status: DC | PRN
  Administered 2014-01-22: 60 ug via INTRAVENOUS

## 2014-01-22 MED ORDER — PROPOFOL 10 MG/ML IV BOLUS
INTRAVENOUS | Status: AC
  Filled 2014-01-22: qty 20

## 2014-01-22 MED ORDER — BUPIVACAINE HCL (PF) 0.25 % IJ SOLN
INTRAMUSCULAR | Status: AC
  Filled 2014-01-22: qty 30

## 2014-01-22 MED ORDER — PROMETHAZINE HCL 25 MG/ML IJ SOLN: 6.2500 mg | INTRAMUSCULAR | Status: DC | PRN

## 2014-01-22 MED ORDER — BUPIVACAINE HCL (PF) 0.25 % IJ SOLN
INTRAMUSCULAR | Status: DC | PRN
  Administered 2014-01-22: 6 mL

## 2014-01-22 MED ORDER — FENTANYL CITRATE 0.05 MG/ML IJ SOLN
INTRAMUSCULAR | Status: AC
  Filled 2014-01-22: qty 2

## 2014-01-22 MED ORDER — LIDOCAINE HCL 1 % IJ SOLN
INTRAMUSCULAR | Status: DC | PRN
  Administered 2014-01-22: 50 mg via INTRADERMAL

## 2014-01-22 MED ORDER — CEFAZOLIN SODIUM-DEXTROSE 2-3 GM-% IV SOLR
2.0000 g | INTRAVENOUS | Status: AC
  Administered 2014-01-22: 2 g via INTRAVENOUS

## 2014-01-22 SURGICAL SUPPLY — 28 items
BANDAGE ELASTIC 3 VELCRO ST LF (GAUZE/BANDAGES/DRESSINGS) ×3 IMPLANT
CORDS BIPOLAR (ELECTRODE) ×3 IMPLANT
CUFF TOURN SGL QUICK 18 (TOURNIQUET CUFF) ×2 IMPLANT
CUFF TOURN SGL QUICK 24 (TOURNIQUET CUFF)
CUFF TRNQT CYL 24X4X40X1 (TOURNIQUET CUFF) IMPLANT
DRAPE SURG 17X11 SM STRL (DRAPES) ×3 IMPLANT
DRSG XEROFORM 1X8 (GAUZE/BANDAGES/DRESSINGS) ×2 IMPLANT
DURAPREP 26ML APPLICATOR (WOUND CARE) ×3 IMPLANT
GAUZE SPONGE 4X4 12PLY STRL (GAUZE/BANDAGES/DRESSINGS) ×3 IMPLANT
GAUZE XEROFORM 1X8 LF (GAUZE/BANDAGES/DRESSINGS) ×3 IMPLANT
GLOVE BIO SURGEON STRL SZ7.5 (GLOVE) ×3 IMPLANT
GLOVE BIOGEL PI IND STRL 8 (GLOVE) ×1 IMPLANT
GLOVE BIOGEL PI INDICATOR 8 (GLOVE)
GLOVE ECLIPSE 8.0 STRL XLNG CF (GLOVE) IMPLANT
GOWN STRL REUS W/TWL XL LVL3 (GOWN DISPOSABLE) ×3 IMPLANT
KIT BASIN OR (CUSTOM PROCEDURE TRAY) ×3 IMPLANT
NEEDLE HYPO 22GX1.5 SAFETY (NEEDLE) ×3 IMPLANT
NS IRRIG 1000ML POUR BTL (IV SOLUTION) ×3 IMPLANT
PACK LOWER EXTREMITY WL (CUSTOM PROCEDURE TRAY) ×3 IMPLANT
PAD CAST 3X4 CTTN HI CHSV (CAST SUPPLIES) ×1 IMPLANT
PADDING CAST COTTON 3X4 STRL (CAST SUPPLIES) ×3
POSITIONER SURGICAL ARM (MISCELLANEOUS) ×3 IMPLANT
SUCTION FRAZIER TIP 10 FR DISP (SUCTIONS) IMPLANT
SUT ETHILON 3 0 PS 1 (SUTURE) ×5 IMPLANT
SUT VIC AB 3-0 SH 27 (SUTURE) ×3
SUT VIC AB 3-0 SH 27X BRD (SUTURE) IMPLANT
SYR CONTROL 10ML LL (SYRINGE) ×3 IMPLANT
TOWEL OR 17X26 10 PK STRL BLUE (TOWEL DISPOSABLE) ×3 IMPLANT

## 2014-01-22 NOTE — Anesthesia Postprocedure Evaluation (Signed)
  Anesthesia Post-op Note  Patient: [name]  Procedure(s) Performed: Procedure(s) (LRB): EXCISION LEFT WRIST MASS (Left)  Patient Location: PACU  Anesthesia Type: General  Level of Consciousness: awake and alert   Airway and Oxygen Therapy: Patient Spontanous Breathing  Post-op Pain: mild  Post-op Assessment: Post-op Vital signs reviewed, Patient's Cardiovascular Status Stable, Respiratory Function Stable, Patent Airway and No signs of Nausea or vomiting  Last Vitals:  Filed Vitals:   01/22/14 1555  BP: 173/99  Pulse: 70  Temp: 36.7 C  Resp: 14    Post-op Vital Signs: stable   Complications: No apparent anesthesia complications

## 2014-01-22 NOTE — Anesthesia Preprocedure Evaluation (Addendum)
Anesthesia Evaluation  Patient identified by MRN, date of birth, ID band Patient awake    Reviewed: Allergy & Precautions, H&P , NPO status , Patient's Chart, lab work & pertinent test results  Airway Mallampati: II TM Distance: >3 FB Neck ROM: Full    Dental no notable dental hx.    Pulmonary neg pulmonary ROS, former smoker,  breath sounds clear to auscultation  Pulmonary exam normal       Cardiovascular hypertension, + CAD, + CABG and + Peripheral Vascular Disease Rhythm:Regular Rate:Normal     Neuro/Psych TIAnegative psych ROS   GI/Hepatic negative GI ROS, Neg liver ROS,   Endo/Other  diabetes, Type 2  Renal/GU negative Renal ROS  negative genitourinary   Musculoskeletal negative musculoskeletal ROS (+)   Abdominal   Peds negative pediatric ROS (+)  Hematology negative hematology ROS (+)   Anesthesia Other Findings   Reproductive/Obstetrics negative OB ROS                          Anesthesia Physical Anesthesia Plan  ASA: III  Anesthesia Plan: General   Post-op Pain Management:    Induction: Intravenous  Airway Management Planned: LMA  Additional Equipment:   Intra-op Plan:   Post-operative Plan:   Informed Consent: I have reviewed the patients History and Physical, chart, labs and discussed the procedure including the risks, benefits and alternatives for the proposed anesthesia with the patient or authorized representative who has indicated his/her understanding and acceptance.   Dental advisory given  Plan Discussed with: CRNA and Surgeon  Anesthesia Plan Comments:         Anesthesia Quick Evaluation

## 2014-01-22 NOTE — H&P (Signed)
[name] is an 73 y.o. male.   Chief Complaint:   Painful recurrent left wrist mass HPI:   73 yo male with a left volar wrist mass that has slowly grown and become painful.  He has had this treated by Dermatology before and it did come back.  He wishes to now have is excised due to the pain it is causing him when wearing his watch and due to the fact that it has grown in size.  Past Medical History  Diagnosis Date  . Arteriosclerotic cardiovascular disease (ASCVD) 2006    CABG in 2006; normal EF  . Cerebrovascular disease 2007    hx of TIA in 08; duplex 60-80% R vertebral artery stenosis; moderate ASVD of the ICAs  . Peripheral vascular disease     Left femoral art. stent; bilateral renal art. stents-2007; ABIs-nl right; 0.80 left in 5/06; angio in 2007-95% bilat. renal; diffuse aortoiliac ASCVD +ulceration;     . Hypertension     LVH  . Hyperlipidemia   . Tobacco abuse, in remission     [id number]  . Abdominal aortic aneurysm     3.5 cm in 2011  . Adenomatous polyps   . Diverticulosis   . Degenerative joint disease of spine 2011    Lumbosacral and cervical; ant. discectomy, fusion and plate in 1962  . DIABETES MELLITUS, TYPE II 01/31/2010  . Sinus headache   . Insomnia   . ED (erectile dysfunction)   . CAD (coronary artery disease)   . Carotid stenosis   . Neuropathy     Past Surgical History  Procedure Laterality Date  . Coronary artery bypass graft  2006    LIMA to LAD; SVG to marginal; SVG to L posterolateral; nondoninant RCA   . Colonoscopy w/ polypectomy  2010  . Anterior fusion cervical spine  2005   . Lumbar spine surgery  1985     Discectomy and fusion    Family History  Problem Relation Age of Onset  . Stroke Father   . Pulmonary embolism Mother     Following hip fracture  . Colon cancer Brother    Social History:  reports that he quit smoking about 10 years ago. He has never used smokeless tobacco. He reports that he does not drink alcohol or use  illicit drugs.  Allergies:  Allergies  Allergen Reactions  . Lipitor [Atorvastatin]     Side effects-muscle aches    No prescriptions prior to admission    No results found for this or any previous visit (from the past 48 hour(s)). No results found.  Review of Systems  All other systems reviewed and are negative.   There were no vitals taken for this visit. Physical Exam  Constitutional: He is oriented to person, place, and time. He appears well-developed and well-nourished.  HENT:  Head: Normocephalic and atraumatic.  Eyes: EOM are normal. Pupils are equal, round, and reactive to light.  Neck: Normal range of motion. Neck supple.  Cardiovascular: Normal rate and regular rhythm.   Respiratory: Effort normal and breath sounds normal.  GI: Soft. Bowel sounds are normal.  Musculoskeletal:       Arms: Neurological: He is alert and oriented to person, place, and time.  Skin: Skin is warm and dry.  Psychiatric: He has a normal mood and affect.     Assessment/Plan Recurrent mass volar/radial wrist 1)  To the OR today for excision of this mass as an outpatient.  Epic Tribbett Y 01/22/2014, 7:27 AM

## 2014-01-22 NOTE — Transfer of Care (Signed)
Immediate Anesthesia Transfer of Care Note  Patient: [name]  Procedure(s) Performed: Procedure(s): EXCISION LEFT WRIST MASS (Left)  Patient Location: PACU  Anesthesia Type:General  Level of Consciousness: awake, alert , oriented and patient cooperative  Airway & Oxygen Therapy: Patient Spontanous Breathing and Patient connected to face mask oxygen  Post-op Assessment: Report given to PACU RN, Post -op Vital signs reviewed and stable and Patient moving all extremities  Post vital signs: Reviewed and stable  Complications: No apparent anesthesia complications

## 2014-01-22 NOTE — Discharge Instructions (Signed)
Increase your activities as comfort allows. Leave your current left wrist dressing on and keep it clean and dry for the next 3 days. In 3 days, you can remove your dressings and start getting your incision wet daily in the shower. After you shower, place a very thin layer of triple antibiotic ointment or neosporin on the incision daily follow-up by a band-aid.            Call MD if any redness, swelling or drainage from incision site. Call for fever over 101 or severe pain.

## 2014-01-22 NOTE — Brief Op Note (Signed)
01/22/2014  2:38 PM  PATIENT:  [name]  73 y.o. male  PRE-OPERATIVE DIAGNOSIS:  Recurrent left volar wrist mass  POST-OPERATIVE DIAGNOSIS:  * No post-op diagnosis entered *  PROCEDURE:  Procedure(s): EXCISION LEFT WRIST MASS (Left)  SURGEON:  Surgeon(s) and Role:    * Mcarthur Rossetti, MD - Primary  PHYSICIAN ASSISTANT:   ASSISTANTS: none   ANESTHESIA:   local and general  EBL:   minimal  LOCAL MEDICATIONS USED:  MARCAINE     SPECIMEN:  Excision  DISPOSITION OF SPECIMEN:  PATHOLOGY  TOURNIQUET:   Total Tourniquet Time Documented: Upper Arm (Left) - 13 minutes Total: Upper Arm (Left) - 13 minutes   DICTATION: .Other Dictation: Dictation Number [id number]  PATIENT DISPOSITION:  PACU - hemodynamically stable.   Delay start of Pharmacological VTE agent (>24hrs) due to surgical blood loss or risk of bleeding: not applicable

## 2014-01-23 NOTE — Op Note (Signed)
NAMEOSLO, HUNTSMAN               ACCOUNT NO.:  [id number]  MEDICAL RECORD NO.:  [id number]  LOCATION:  [facility]                         FACILITY:  [facility]  PHYSICIAN:  Lind Guest. Ninfa Linden, M.D.DATE OF BIRTH:  [birth year]  DATE OF PROCEDURE:  01/22/2014 DATE OF DISCHARGE:  01/22/2014                              OPERATIVE REPORT   PREOPERATIVE DIAGNOSIS:  Left volar wrist mass.  POSTOPERATIVE DIAGNOSIS:  Left volar wrist mass.  PROCEDURE:  Excision of left volar wrist mass.  FINDINGS:  Superficial and subcutaneous left volar wrist mass, pathology pending.  SURGEON:  Lind Guest. Ninfa Linden, MD  ANESTHESIA: 1. General. 2. Local with 0.25% plain Marcaine.  TOURNIQUET TIME:  30 minutes.  ESTIMATED BLOOD LOSS:  Minimal.  COMPLICATIONS:  None.  INDICATIONS:  Mr. Bulson is a 73 year old gentleman with a recurrent mass on the volar aspect of his left wrist, it was thought this was potentially ganglion cyst.  He has had this excised at least once by Dermatology, which was found to be benign, but it did reoccur.  He has tried to put a pin in it several times on his own with no success and I cleaned it thoroughly in the office and could not aspirate anything from it.  At this point, it was causing him pain and discomfort, and he wished to have it excised.  PROCEDURE DESCRIPTION:  After informed consent was obtained, appropriate left wrist was marked.  He was brought to the operating room, placed supine on the operating table.  General anesthesia was then obtained. His left arm was placed on the arm table, a nonsterile tourniquet was placed around the upper left arm, his left hand and wrist were prepped and draped with DuraPrep and sterile drapes.  Time-out was called, and he was identified as correct patient, correct left wrist.  We then made an incision directly over the small volar wrist mass, not able to excise it, and almost seemed like a granulomatous type reaction.  I  sharply removed the mass in the subcutaneous and subcuticular tissue and passed off the table to send it to Pathology.  I explored the the deep tissue underneath and I did find a small little stalk that may be consistent with where the cyst was arising from the wrist and I did close this off and cauterize it.  I then explored the soft tissue surrounding and found no other complicating features.  I irrigated the soft tissues with normal saline solution and closed the deep tissue with 3-0 Vicryl followed by interrupted 3-0 nylon on the skin.  We infiltrated the incision with 0.25% plain Sensorcaine.  Xeroform and well- padded sterile dressings were applied.  The tourniquet was let down. The fingers pinked nicely.  He was awakened, extubated, and taken to the recovery room in stable condition.  All final counts were correct. There were no complications noted.     Lind Guest. Ninfa Linden, M.D.     CYB/MEDQ  D:  01/22/2014  T:  01/23/2014  Job:  [id number]

## 2014-01-25 ENCOUNTER — Encounter (HOSPITAL_COMMUNITY): Payer: Self-pay | Admitting: Orthopaedic Surgery

## 2014-01-28 ENCOUNTER — Ambulatory Visit: Payer: Medicare Other | Admitting: Family Medicine

## 2014-02-04 ENCOUNTER — Ambulatory Visit: Payer: Medicare Other | Admitting: Family Medicine

## 2014-02-05 ENCOUNTER — Encounter: Payer: Self-pay | Admitting: Family Medicine

## 2014-02-05 ENCOUNTER — Ambulatory Visit (INDEPENDENT_AMBULATORY_CARE_PROVIDER_SITE_OTHER): Payer: Medicare Other | Admitting: Family Medicine

## 2014-02-05 VITALS — BP 142/88 | Ht 70.0 in | Wt 212.0 lb

## 2014-02-05 DIAGNOSIS — Z79899 Other long term (current) drug therapy: Secondary | ICD-10-CM | POA: Diagnosis not present

## 2014-02-05 DIAGNOSIS — I2510 Atherosclerotic heart disease of native coronary artery without angina pectoris: Secondary | ICD-10-CM | POA: Diagnosis not present

## 2014-02-05 DIAGNOSIS — E785 Hyperlipidemia, unspecified: Secondary | ICD-10-CM

## 2014-02-05 DIAGNOSIS — I7090 Unspecified atherosclerosis: Secondary | ICD-10-CM

## 2014-02-05 DIAGNOSIS — G4700 Insomnia, unspecified: Secondary | ICD-10-CM | POA: Diagnosis not present

## 2014-02-05 MED ORDER — ALPRAZOLAM 0.5 MG PO TABS: 0.5000 mg | ORAL_TABLET | Freq: Every evening | ORAL | Status: DC | PRN

## 2014-02-05 NOTE — Progress Notes (Signed)
   Subjective:    Patient ID: [name], male    DOB: [birth year], 73 y.o.   MRN: [id number]  Hypertension This is a chronic problem. The current episode started more than 1 year ago. There are no compliance problems.   Needs refill on xanax. Takes one every night. Helpful in sleeping.   Flu vaccine today.  Nose bleed every week or two. Taking plavix. No headache no fever no congestion. No chest pain. No exertional dyspnea.  Ongoing joint pain primarily in the back. Minimal radiation to the legs. Needs  b w  Rare sl red steaking  Sig nose bleed  Some allergy hx    Review of Systems No shortness of breath no chest pain no headaches no abdominal pain or change in bowel habits no blood in stool    Objective:   Physical Exam  Alert vitals stable. HEENT normal. Neck supple. Lungs clear. Heart regular in rhythm. Ankles without edema.      Assessment & Plan:  Impression 1 hypertension good control. #2 insomnia controlled on her medicine. #3 epistaxis likely related to Plavix plus allergies discussed #4 coronary artery disease clinically silent #5 degenerative disc disease ongoing symptomatology #6 hyperlipidemia status uncertain plan all medications refilled diet discussed appropriate blood work further recommendations based results for shot today. At [facility]

## 2014-02-08 DIAGNOSIS — Z79899 Other long term (current) drug therapy: Secondary | ICD-10-CM | POA: Diagnosis not present

## 2014-02-08 DIAGNOSIS — E785 Hyperlipidemia, unspecified: Secondary | ICD-10-CM | POA: Diagnosis not present

## 2014-02-09 LAB — HEPATIC FUNCTION PANEL
ALT: 25 U/L (ref 0–53)
AST: 23 U/L (ref 0–37)
Albumin: 4 g/dL (ref 3.5–5.2)
Alkaline Phosphatase: 83 U/L (ref 39–117)
Bilirubin, Direct: 0.1 mg/dL (ref 0.0–0.3)
Indirect Bilirubin: 0.5 mg/dL (ref 0.2–1.2)
Total Bilirubin: 0.6 mg/dL (ref 0.2–1.2)
Total Protein: 6.6 g/dL (ref 6.0–8.3)

## 2014-02-09 LAB — LIPID PANEL
Cholesterol: 115 mg/dL (ref 0–200)
HDL: 35 mg/dL — ABNORMAL LOW (ref >39)
LDL Cholesterol: 47 mg/dL (ref 0–99)
Total CHOL/HDL Ratio: 3.3 Ratio
Triglycerides: 163 mg/dL — ABNORMAL HIGH (ref <150)
VLDL: 33 mg/dL (ref 0–40)

## 2014-02-14 ENCOUNTER — Encounter: Payer: Self-pay | Admitting: Family Medicine

## 2014-02-16 ENCOUNTER — Other Ambulatory Visit: Payer: Self-pay | Admitting: Family Medicine

## 2014-02-18 DIAGNOSIS — M25562 Pain in left knee: Secondary | ICD-10-CM | POA: Diagnosis not present

## 2014-03-17 DIAGNOSIS — L818 Other specified disorders of pigmentation: Secondary | ICD-10-CM | POA: Diagnosis not present

## 2014-03-17 DIAGNOSIS — L570 Actinic keratosis: Secondary | ICD-10-CM | POA: Diagnosis not present

## 2014-03-17 DIAGNOSIS — D485 Neoplasm of uncertain behavior of skin: Secondary | ICD-10-CM | POA: Diagnosis not present

## 2014-03-17 DIAGNOSIS — L820 Inflamed seborrheic keratosis: Secondary | ICD-10-CM | POA: Diagnosis not present

## 2014-03-18 ENCOUNTER — Other Ambulatory Visit: Payer: Self-pay | Admitting: Family Medicine

## 2014-03-19 NOTE — Telephone Encounter (Signed)
Ok plus 5 ref 

## 2014-05-13 ENCOUNTER — Ambulatory Visit (INDEPENDENT_AMBULATORY_CARE_PROVIDER_SITE_OTHER): Payer: Medicare Other | Admitting: Cardiovascular Disease

## 2014-05-13 ENCOUNTER — Encounter: Payer: Self-pay | Admitting: Cardiovascular Disease

## 2014-05-13 VITALS — BP 136/80 | HR 90 | Ht 70.0 in | Wt 214.0 lb

## 2014-05-13 DIAGNOSIS — I25810 Atherosclerosis of coronary artery bypass graft(s) without angina pectoris: Secondary | ICD-10-CM | POA: Diagnosis not present

## 2014-05-13 DIAGNOSIS — I2510 Atherosclerotic heart disease of native coronary artery without angina pectoris: Secondary | ICD-10-CM | POA: Diagnosis not present

## 2014-05-13 DIAGNOSIS — I714 Abdominal aortic aneurysm, without rupture, unspecified: Secondary | ICD-10-CM

## 2014-05-13 DIAGNOSIS — I679 Cerebrovascular disease, unspecified: Secondary | ICD-10-CM

## 2014-05-13 DIAGNOSIS — I10 Essential (primary) hypertension: Secondary | ICD-10-CM

## 2014-05-13 DIAGNOSIS — E782 Mixed hyperlipidemia: Secondary | ICD-10-CM

## 2014-05-13 DIAGNOSIS — I6523 Occlusion and stenosis of bilateral carotid arteries: Secondary | ICD-10-CM | POA: Diagnosis not present

## 2014-05-13 DIAGNOSIS — I739 Peripheral vascular disease, unspecified: Secondary | ICD-10-CM | POA: Diagnosis not present

## 2014-05-13 NOTE — Progress Notes (Signed)
Patient ID: [name], male   DOB: [birth year], 73 y.o.   MRN: [id number]      SUBJECTIVE: The patient is a 73 year old male with a past medical history significant for coronary artery disease and CABG, abdominal aortic aneurysm, peripheral vascular disease, hypertension, TIA, diabetes, and hyperlipidemia. This is my first time meeting him. He denies chest pain, leg swelling, palpitations, and shortness of breath. He also denies abdominal pain. He has had left leg claudication for several years after 50-100 yards of walking. He exercises at the [facility] in [place] (lives in [place]) three times per week, but has not done so for the past month. He said he's had renal artery stenting bilaterally, but has not followed up with vascular surgery in several years.  Review of Systems: As per "subjective", otherwise negative.  Allergies  Allergen Reactions  . Lipitor [Atorvastatin]     Side effects-muscle aches    Current Outpatient Prescriptions  Medication Sig Dispense Refill  . ALPRAZolam (XANAX) 0.5 MG tablet Take 1 tablet (0.5 mg total) by mouth at bedtime as needed for anxiety. 30 tablet 5  . clopidogrel (PLAVIX) 75 MG tablet take 1 tablet by mouth once daily 30 tablet 5  . HYDROcodone-acetaminophen (NORCO) 5-325 MG per tablet Take 1-2 tablets by mouth every 4 (four) hours as needed for moderate pain. 30 tablet 0  . lisinopril (PRINIVIL,ZESTRIL) 20 MG tablet take 1 tablet by mouth twice a day 60 tablet 5  . Multiple Vitamin (MULTIVITAMIN) tablet Take 1 tablet by mouth daily.      . nortriptyline (PAMELOR) 25 MG capsule take 1 capsule by mouth at bedtime 30 capsule 5  . Omega-3 Fatty Acids (FISH OIL) 1000 MG CAPS Take 1 capsule by mouth daily.      . simvastatin (ZOCOR) 80 MG tablet Take 80 mg by mouth daily.    . temazepam (RESTORIL) 30 MG capsule take 1 capsule by mouth at bedtime if needed 30 capsule 5   No current facility-administered medications for this visit.    Past  Medical History  Diagnosis Date  . Arteriosclerotic cardiovascular disease (ASCVD) 2006    CABG in 2006; normal EF  . Cerebrovascular disease 2007    hx of TIA in 08; duplex 60-80% R vertebral artery stenosis; moderate ASVD of the ICAs  . Peripheral vascular disease     Left femoral art. stent; bilateral renal art. stents-2007; ABIs-nl right; 0.80 left in 5/06; angio in 2007-95% bilat. renal; diffuse aortoiliac ASCVD +ulceration;     . Hypertension     LVH  . Hyperlipidemia   . Tobacco abuse, in remission     [id number]  . Abdominal aortic aneurysm     3.5 cm in 2011  . Adenomatous polyps   . Diverticulosis   . Degenerative joint disease of spine 2011    Lumbosacral and cervical; ant. discectomy, fusion and plate in 7341  . DIABETES MELLITUS, TYPE II 01/31/2010  . Sinus headache   . Insomnia   . ED (erectile dysfunction)   . CAD (coronary artery disease)   . Carotid stenosis   . Neuropathy     Past Surgical History  Procedure Laterality Date  . Coronary artery bypass graft  2006    LIMA to LAD; SVG to marginal; SVG to L posterolateral; nondoninant RCA   . Colonoscopy w/ polypectomy  2010  . Anterior fusion cervical spine  2005   . Lumbar spine surgery  1985     Discectomy and fusion  .  Mass excision Left 01/22/2014    Procedure: EXCISION LEFT WRIST MASS;  Surgeon: Mcarthur Rossetti, MD;  Location: [facility];  Service: Orthopedics;  Laterality: Left;    History   Social History  . Marital Status: Married    Spouse Name: N/A    Number of Children: N/A  . Years of Education: N/A   Occupational History  . Retired    Social History Main Topics  . Smoking status: Former Smoker -- 1.00 packs/day for 50 years    Quit date: 10/18/2003  . Smokeless tobacco: Never Used     Comment: 50 pack years; quit in 2005   . Alcohol Use: No  . Drug Use: No  . Sexual Activity: Not on file   Other Topics Concern  . Not on file   Social History Narrative   Retired; married; does  not get regular exercise.      Filed Vitals:   05/13/14 0921  Height: 5\' 10"  (1.778 m)  Weight: 214 lb (97.07 kg)   BP 136/80 Pulse 90  SpO2 97%   PHYSICAL EXAM General: NAD HEENT: Normal. Neck: No JVD, no thyromegaly. Lungs: Clear to auscultation bilaterally with normal respiratory effort. CV: Nondisplaced PMI.  Regular rate and rhythm, normal S1/S2, no S3/S4, no murmur. No pretibial or periankle edema.  No carotid bruit.  Diminished pedal pulses in left leg. Abdomen: Soft, nontender, no hepatosplenomegaly, obese, mild distention.  Neurologic: Alert and oriented x 3.  Psych: Normal affect. Skin: Normal. Musculoskeletal: Normal range of motion, no gross deformities. Extremities: No clubbing or cyanosis.   ECG: Most recent ECG reviewed.      ASSESSMENT AND PLAN: 1. CAD with CABG: Stable ischemic heart disease. Continue therapy with simvastatin. Not on ASA as he is on Plavix for h/o TIA apparently. 2. Essential HTN: Well controlled on ACEI. No changes. 3. Hyperlipidemia: Lipids on 02/08/14 showed TC 115, TG 163, HDL 35, LDL 47. Continue present dose of statin therapy. 4. PVD: Previously followed with Dr. Kellie Simmering but not in several years. Has left leg claudication. Will obtain ABI's. 5. AAA: Measured 4.1 cm (increase from 3.5 cm) in 11/2011, with no imaging since that time. Will order [place] to assess for progression. 6. Carotid artery stenosis: Had 50-69% RICA stenosis and <59% LICA stenosis in 05/6382. Will order carotid Dopplers.  Dispo: f/u 6 months.  Kate Sable, M.D., F.A.C.C.

## 2014-05-13 NOTE — Patient Instructions (Signed)
Your physician has requested that you have an abdominal aorta duplex. During this test, an ultrasound is used to evaluate the aorta. Allow 30 minutes for this exam. Do not eat after midnight the day before and avoid carbonated beverages Your physician has requested that you have a carotid duplex. This test is an ultrasound of the carotid arteries in your neck. It looks at blood flow through these arteries that supply the brain with blood. Allow one hour for this exam. There are no restrictions or special instructions. Your physician has requested that you have an ankle brachial index (ABI). During this test an ultrasound and blood pressure cuff are used to evaluate the arteries that supply the arms and legs with blood. Allow thirty minutes for this exam. There are no restrictions or special instructions. Office will contact with results via phone or letter.   Continue all current medications. Your physician wants you to follow up in: 6 months.  You will receive a reminder letter in the mail one-two months in advance.  If you don't receive a letter, please call our office to schedule the follow up appointment

## 2014-05-26 DIAGNOSIS — I714 Abdominal aortic aneurysm, without rupture, unspecified: Secondary | ICD-10-CM

## 2014-05-27 ENCOUNTER — Telehealth: Payer: Self-pay | Admitting: *Deleted

## 2014-05-27 DIAGNOSIS — I714 Abdominal aortic aneurysm, without rupture, unspecified: Secondary | ICD-10-CM

## 2014-05-27 NOTE — Telephone Encounter (Signed)
Notes Recorded by Laurine Blazer, LPN on 11/06/6387 at 37:34 PM Patient notified. He agrees to vascular referral.  Notes Recorded by Herminio Commons, MD on 05/26/2014 at 1:52 PM Would also have him referred to vascular surgery.  Notes Recorded by Herminio Commons, MD on 05/26/2014 at 1:51 PM Will need a repeat study in 6 months.

## 2014-06-02 ENCOUNTER — Encounter (INDEPENDENT_AMBULATORY_CARE_PROVIDER_SITE_OTHER): Payer: Medicare Other

## 2014-06-02 ENCOUNTER — Other Ambulatory Visit: Payer: Self-pay | Admitting: Radiology

## 2014-06-02 DIAGNOSIS — I6523 Occlusion and stenosis of bilateral carotid arteries: Secondary | ICD-10-CM | POA: Diagnosis not present

## 2014-06-02 DIAGNOSIS — I739 Peripheral vascular disease, unspecified: Secondary | ICD-10-CM | POA: Diagnosis not present

## 2014-06-08 ENCOUNTER — Telehealth: Payer: Self-pay | Admitting: *Deleted

## 2014-06-08 NOTE — Telephone Encounter (Signed)
Notes Recorded by Laurine Blazer, LPN on 01/09/9370 at 6:96 PM Patient notified.

## 2014-06-08 NOTE — Telephone Encounter (Signed)
-----   Message from Merlene Laughter, LPN sent at 06/09/5168 12:56 PM EST -----   ----- Message -----    From: Herminio Commons, MD    Sent: 06/03/2014   8:32 AM      To: Merlene Laughter, LPN  Bilateral blockages, greater on right. Repeat in 6 months.

## 2014-06-10 ENCOUNTER — Telehealth: Payer: Self-pay | Admitting: *Deleted

## 2014-06-10 NOTE — Telephone Encounter (Signed)
Call placed to [facility] to make them aware.  This information has been added to appointment note.

## 2014-06-10 NOTE — Telephone Encounter (Signed)
ABI / LE ARTERIAL DOPPLER -  =========================  No, this can also be addressed by Dr. Scot Dock so long as Dr. Scot Dock is made aware of this as well. Thanks.       ----- Message -----    From: Laurine Blazer, LPN    Sent: 2/80/0349  5:01 PM     To: Herminio Commons, MD     Notes Recorded by Herminio Commons, MD on 06/09/2014 at 10:43 AM Can see [facility]. Notes Recorded by Laurine Blazer, LPN on 1/79/1505 at 6:97 PM Notes Recorded by Laurine Blazer, LPN on 9/48/0165 at 5:37 PM Patient notified. Stated that he already has new patient appointment scheduled with [facility] - Dr. Deitra Mayo 06/30/2014 in reference to his AAA.  Is this something we can defer to him as well, or did you specifically want him to see Dr. Fletcher Anon?  Notes Recorded by Herminio Commons, MD on 06/08/2014 at 11:17 AM Please have him referred to Dr. Fletcher Anon.

## 2014-06-29 ENCOUNTER — Encounter: Payer: Self-pay | Admitting: Vascular Surgery

## 2014-06-30 ENCOUNTER — Ambulatory Visit (INDEPENDENT_AMBULATORY_CARE_PROVIDER_SITE_OTHER): Payer: Medicare Other | Admitting: Vascular Surgery

## 2014-06-30 ENCOUNTER — Encounter: Payer: Self-pay | Admitting: Vascular Surgery

## 2014-06-30 VITALS — BP 174/94 | HR 79 | Ht 70.0 in | Wt 213.0 lb

## 2014-06-30 DIAGNOSIS — Z48812 Encounter for surgical aftercare following surgery on the circulatory system: Secondary | ICD-10-CM

## 2014-06-30 DIAGNOSIS — I739 Peripheral vascular disease, unspecified: Secondary | ICD-10-CM | POA: Diagnosis not present

## 2014-06-30 DIAGNOSIS — I714 Abdominal aortic aneurysm, without rupture, unspecified: Secondary | ICD-10-CM

## 2014-06-30 DIAGNOSIS — I6523 Occlusion and stenosis of bilateral carotid arteries: Secondary | ICD-10-CM | POA: Diagnosis not present

## 2014-06-30 NOTE — Progress Notes (Signed)
Patient ID: [name], male   DOB: 1940/06/22, 74 y.o.   MRN: [id number]  Reason for Consult: AAA   Referred by Mikey Kirschner, MD  Subjective:     HPI:  [name] is a 74 y.o. male who has been followed with an abdominal aortic aneurysm. The aneurysm had enlarged in size and therefore he was sent for vascular consultation. He denies any history of abdominal pain or back pain. He does state that he had a brother who died from an aneurysm but he is not sure of the location of that aneurysm.  I have reviewed the records that were sent with the patient. His outpatient vascular lab study was included. The aneurysm measures 4.8 cm in maximum diameter. In April 2011 CT scan showed that the aneurysm measured 3.7 cm in maximum diameter. In July 2013 a CT scan showed that the maximal diameter of the aneurysm was 4.1 cm.  Past Medical History  Diagnosis Date  . Arteriosclerotic cardiovascular disease (ASCVD) 2006    CABG in 2006; normal EF  . Cerebrovascular disease 2007    hx of TIA in 08; duplex 60-80% R vertebral artery stenosis; moderate ASVD of the ICAs  . Peripheral vascular disease     Left femoral art. stent; bilateral renal art. stents-2007; ABIs-nl right; 0.80 left in 5/06; angio in 2007-95% bilat. renal; diffuse aortoiliac ASCVD +ulceration;     . Hypertension     LVH  . Hyperlipidemia   . Tobacco abuse, in remission     [id number]  . Abdominal aortic aneurysm     3.5 cm in 2011  . Adenomatous polyps   . Diverticulosis   . Degenerative joint disease of spine 2011    Lumbosacral and cervical; ant. discectomy, fusion and plate in 0263  . DIABETES MELLITUS, TYPE II 01/31/2010  . Sinus headache   . Insomnia   . ED (erectile dysfunction)   . CAD (coronary artery disease)   . Carotid stenosis   . Neuropathy   . Stroke    Family History  Problem Relation Age of Onset  . Stroke Father   . Pulmonary embolism Mother     Following hip fracture  . Colon cancer  Brother   . Hypertension Brother    Past Surgical History  Procedure Laterality Date  . Coronary artery bypass graft  2006    LIMA to LAD; SVG to marginal; SVG to L posterolateral; nondoninant RCA   . Colonoscopy w/ polypectomy  2010  . Anterior fusion cervical spine  2005   . Lumbar spine surgery  1985     Discectomy and fusion  . Mass excision Left 01/22/2014    Procedure: EXCISION LEFT WRIST MASS;  Surgeon: Mcarthur Rossetti, MD;  Location: [facility];  Service: Orthopedics;  Laterality: Left;   Short Social History:  History  Substance Use Topics  . Smoking status: Former Smoker -- 1.00 packs/day for 50 years    Types: Cigarettes    Quit date: 10/18/2003  . Smokeless tobacco: Never Used     Comment: 50 pack years; quit in 2005   . Alcohol Use: No    Allergies  Allergen Reactions  . Lipitor [Atorvastatin]     Side effects-muscle aches    Current Outpatient Prescriptions  Medication Sig Dispense Refill  . ALPRAZolam (XANAX) 0.5 MG tablet Take 1 tablet (0.5 mg total) by mouth at bedtime as needed for anxiety. 30 tablet 5  . clopidogrel (PLAVIX) 75 MG tablet  take 1 tablet by mouth once daily 30 tablet 5  . lisinopril (PRINIVIL,ZESTRIL) 20 MG tablet take 1 tablet by mouth twice a day 60 tablet 5  . Multiple Vitamin (MULTIVITAMIN) tablet Take 1 tablet by mouth daily.      . nortriptyline (PAMELOR) 25 MG capsule take 1 capsule by mouth at bedtime 30 capsule 5  . Omega-3 Fatty Acids (FISH OIL) 1000 MG CAPS Take 1 capsule by mouth daily.      . simvastatin (ZOCOR) 80 MG tablet Take 80 mg by mouth daily.    . temazepam (RESTORIL) 30 MG capsule take 1 capsule by mouth at bedtime if needed 30 capsule 5  . HYDROcodone-acetaminophen (NORCO) 5-325 MG per tablet Take 1-2 tablets by mouth every 4 (four) hours as needed for moderate pain. (Patient not taking: Reported on 06/30/2014) 30 tablet 0   No current facility-administered medications for this visit.    Review of Systems    Constitutional: Negative for chills and fever.  Eyes: Negative for loss of vision.  Respiratory: Negative for cough and wheezing.  Cardiovascular: Positive for claudication. Negative for chest pain, chest tightness, dyspnea with exertion, orthopnea and palpitations.  GI: Negative for blood in stool and vomiting.  GU: Negative for dysuria and hematuria.  Musculoskeletal: Negative for leg pain, joint pain and myalgias.  Skin: Negative for rash and wound.  Neurological: Negative for dizziness and speech difficulty.  Hematologic: Negative for bruises/bleeds easily. Psychiatric: Negative for depressed mood.        Objective:  Objective  Filed Vitals:   06/30/14 1054  BP: 174/94  Pulse: 79  Height: 5\' 10"  (1.778 m)  Weight: 213 lb (96.616 kg)  SpO2: 97%   Body mass index is 30.56 kg/(m^2).  Physical Exam  Constitutional: He is oriented to person, place, and time. He appears well-developed and well-nourished.  HENT:  Head: Normocephalic and atraumatic.  Neck: Neck supple. No JVD present. No thyromegaly present.  Cardiovascular: Normal rate, regular rhythm and normal heart sounds.  Exam reveals no friction rub.   No murmur heard. Pulses:      Femoral pulses are 2+ on the right side, and 2+ on the left side.      Popliteal pulses are 1+ on the right side, and 0 on the left side.       Dorsalis pedis pulses are 0 on the right side, and 0 on the left side.       Posterior tibial pulses are 0 on the right side, and 0 on the left side.  I do not detect carotid bruits.  Pulmonary/Chest: Breath sounds normal. He has no wheezes. He has no rales.  Abdominal: Soft. Bowel sounds are normal. There is no tenderness.  His aneurysm is palpable and nontender.  Musculoskeletal: Normal range of motion. He exhibits no edema.  Lymphadenopathy:    He has no cervical adenopathy.  Neurological: He is alert and oriented to person, place, and time. He has normal strength. No sensory deficit.  Skin:  No lesion and no rash noted.  Psychiatric: He has a normal mood and affect.    Data: I have reviewed the ultrasound that was performed on 05/26/2014. The maximum diameter of the aneurysm is 4.8 cm. It is noted that the aneurysm extends above the level of the renal arteries. The right common iliac artery measures 1.4 cm in maximum diameter. The left common iliac artery measures 1.3 cm in maximum diameter.  Thus the iliacs are somewhat ectatic. The patient is  also noted to have atherosclerotic disease of the aorta and common iliac arteries.      Assessment/Plan:     ABDOMINAL AORTIC ANEURYSM: The patient's abdominal aortic aneurysm measures 4.8 cm in maximum diameter by duplex. I have explained we would not consider elective repair of the aneurysm unless it reached 5.5 cm in maximum diameter. I have recommended a follow up study in 6 months and I have recommended that we do a CT scan in order to help me determine if he would be a candidate for an endovascular repair if the aneurysm continues to enlarge. Based on the duplex, this showed evidence of the aneurysm extending above the renal arteries however I think this would be difficult to determine on ultrasound. Fortunately he is not a smoker. His blood pressure in the office today was somewhat elevated and he will follow up with his primary care doctor for continued close management of his blood pressure. We have today discussed the options for repair if the aneurysm does enlarge. Specifically we have discussed the risks and benefits of endovascular repair, and the risks and benefits of open repair. I'll see him back in 6 months. He knows to call sooner if he has problems.   INFRA INGUINAL ARTERIAL OCCLUSIVE DISEASE: He does have stable left lower extremity claudication. I've ordered ABIs when he comes back in 6 months.     Angelia Mould MD [facility]

## 2014-06-30 NOTE — Addendum Note (Signed)
Addended by: Dorthula Rue L on: 06/30/2014 02:31 PM   Modules accepted: Orders

## 2014-07-19 ENCOUNTER — Other Ambulatory Visit: Payer: Self-pay | Admitting: Family Medicine

## 2014-07-19 NOTE — Telephone Encounter (Signed)
Last seen 02/05/14

## 2014-08-09 ENCOUNTER — Encounter: Payer: Medicare Other | Admitting: Family Medicine

## 2014-08-13 ENCOUNTER — Ambulatory Visit (INDEPENDENT_AMBULATORY_CARE_PROVIDER_SITE_OTHER): Payer: Medicare Other | Admitting: Family Medicine

## 2014-08-13 ENCOUNTER — Encounter: Payer: Self-pay | Admitting: Family Medicine

## 2014-08-13 VITALS — BP 138/88 | Ht 70.0 in | Wt 211.8 lb

## 2014-08-13 DIAGNOSIS — E785 Hyperlipidemia, unspecified: Secondary | ICD-10-CM

## 2014-08-13 DIAGNOSIS — Z0000 Encounter for general adult medical examination without abnormal findings: Secondary | ICD-10-CM | POA: Diagnosis not present

## 2014-08-13 DIAGNOSIS — Z79899 Other long term (current) drug therapy: Secondary | ICD-10-CM

## 2014-08-13 DIAGNOSIS — Z23 Encounter for immunization: Secondary | ICD-10-CM

## 2014-08-13 DIAGNOSIS — Z125 Encounter for screening for malignant neoplasm of prostate: Secondary | ICD-10-CM

## 2014-08-13 DIAGNOSIS — I10 Essential (primary) hypertension: Secondary | ICD-10-CM

## 2014-08-13 MED ORDER — LISINOPRIL 20 MG PO TABS: 20.0000 mg | ORAL_TABLET | Freq: Two times a day (BID) | ORAL | Status: DC

## 2014-08-13 MED ORDER — NEOMYCIN-POLYMYXIN-HC 3.5-10000-1 OT SUSP
OTIC | Status: DC
Start: 2014-08-13 — End: 2015-02-14

## 2014-08-13 MED ORDER — ALPRAZOLAM 0.5 MG PO TABS: 0.5000 mg | ORAL_TABLET | Freq: Every evening | ORAL | Status: DC | PRN

## 2014-08-13 MED ORDER — NORTRIPTYLINE HCL 25 MG PO CAPS: 25.0000 mg | ORAL_CAPSULE | Freq: Every day | ORAL | Status: DC

## 2014-08-13 MED ORDER — CLOPIDOGREL BISULFATE 75 MG PO TABS: 75.0000 mg | ORAL_TABLET | Freq: Every day | ORAL | Status: DC

## 2014-08-13 MED ORDER — SIMVASTATIN 80 MG PO TABS: 80.0000 mg | ORAL_TABLET | Freq: Every day | ORAL | Status: DC

## 2014-08-13 MED ORDER — TEMAZEPAM 30 MG PO CAPS: ORAL_CAPSULE | ORAL | Status: DC

## 2014-08-13 NOTE — Progress Notes (Signed)
Subjective:    Patient ID: [name], male    DOB: [birth year], 74 y.o.   MRN: [id number]  HPI AWV- Annual Wellness Visit  The patient was seen for their annual wellness visit. The patient's past medical history, surgical history, and family history were reviewed. Pertinent vaccines were reviewed ( tetanus, pneumonia, shingles, flu) The patient's medication list was reviewed and updated.  The height and weight were entered. The patient's current BMI is:  Cognitive screening was completed. Outcome of Mini - Cog: pass  Falls within the past 6 months:none  Current tobacco usage: no (All patients who use tobacco were given written and verbal information on quitting)  Recent listing of emergency department/hospitalizations over the past year were reviewed.  current specialist the patient sees on a regular basis: yes-cardiologist   Medicare annual wellness visit patient questionnaire was reviewed.  A written screening schedule for the patient for the next 5-10 years was given. Appropriate discussion of followup regarding next visit was discussed.   Patient reports he is having occasional rectal bleeding and is concerned because he is on the blood thinner Plavix. Patient has history of colonoscopy 2010. Then advised to have another one 7 years. No change in bowel habits. Notes hemorrhoids that occasionally cause slight amount of bright red blood. On Plavix with known coronary artery disease and bypass surgery. Also history of TIA  Takes his meds faithfully.  Patient also reports he has swimmers ear and would like refill of ear drops.   Patient states he would also like to discuss prescription for toe fungus.    Review of Systems  Constitutional: Negative for fever, activity change and appetite change.  HENT: Negative for congestion and rhinorrhea.   Eyes: Negative for discharge.  Respiratory: Negative for cough and wheezing.   Cardiovascular: Negative for chest pain.    Gastrointestinal: Negative for vomiting, abdominal pain and blood in stool.  Genitourinary: Negative for frequency and difficulty urinating.  Musculoskeletal: Negative for neck pain.  Skin: Negative for rash.  Allergic/Immunologic: Negative for environmental allergies and food allergies.  Neurological: Negative for weakness and headaches.  Psychiatric/Behavioral: Negative for agitation.  All other systems reviewed and are negative.      Objective:   Physical Exam  Constitutional: He appears well-developed and well-nourished.  HENT:  Head: Normocephalic and atraumatic.  Right Ear: External ear normal.  Left Ear: External ear normal.  Nose: Nose normal.  Mouth/Throat: Oropharynx is clear and moist.  Eyes: EOM are normal. Pupils are equal, round, and reactive to light.  Neck: Normal range of motion. Neck supple. No thyromegaly present.  Cardiovascular: Normal rate, regular rhythm and normal heart sounds.   No murmur heard. Pulmonary/Chest: Effort normal and breath sounds normal. No respiratory distress. He has no wheezes.  Abdominal: Soft. Bowel sounds are normal. He exhibits no distension and no mass. There is no tenderness.  Genitourinary: Prostate normal and penis normal.  Musculoskeletal: Normal range of motion. He exhibits no edema.  Lymphadenopathy:    He has no cervical adenopathy.  Neurological: He is alert. He exhibits normal muscle tone.  Skin: Skin is warm and dry. No erythema.  Psychiatric: He has a normal mood and affect. His behavior is normal. Judgment normal.  Vitals reviewed.  Rectal exam normal couple small hemorrhoids. Prostate diffuse mild enlargement no nodules       Assessment & Plan:  Impression wellness exam #2 hyperlipidemia status uncertain #3 coronary artery disease. #4 abdominal aneurysm followed by specialist #5 hypertension decent  control plan maintain Plavix for coronary and cardiovascular health. Colonoscopy due next year. Chronic pain medicine  refilled. Appropriate blood work. Further recommendations based on results. WSL

## 2014-08-16 DIAGNOSIS — E785 Hyperlipidemia, unspecified: Secondary | ICD-10-CM | POA: Diagnosis not present

## 2014-08-16 DIAGNOSIS — Z125 Encounter for screening for malignant neoplasm of prostate: Secondary | ICD-10-CM | POA: Diagnosis not present

## 2014-08-16 DIAGNOSIS — Z79899 Other long term (current) drug therapy: Secondary | ICD-10-CM | POA: Diagnosis not present

## 2014-08-17 LAB — LIPID PANEL
Chol/HDL Ratio: 3.5 ratio units (ref 0.0–5.0)
Cholesterol, Total: 126 mg/dL (ref 100–199)
HDL: 36 mg/dL — ABNORMAL LOW (ref >39)
LDL Calculated: 60 mg/dL (ref 0–99)
Triglycerides: 152 mg/dL — ABNORMAL HIGH (ref 0–149)
VLDL Cholesterol Cal: 30 mg/dL (ref 5–40)

## 2014-08-17 LAB — BASIC METABOLIC PANEL
BUN/Creatinine Ratio: 16 (ref 10–22)
BUN: 16 mg/dL (ref 8–27)
CO2: 23 mmol/L (ref 18–29)
Calcium: 9.1 mg/dL (ref 8.6–10.2)
Chloride: 102 mmol/L (ref 97–108)
Creatinine, Ser: 1.03 mg/dL (ref 0.76–1.27)
GFR calc Af Amer: 82 mL/min/{1.73_m2} (ref >59)
GFR calc non Af Amer: 71 mL/min/{1.73_m2} (ref >59)
Glucose: 110 mg/dL — ABNORMAL HIGH (ref 65–99)
Potassium: 4.7 mmol/L (ref 3.5–5.2)
Sodium: 138 mmol/L (ref 134–144)

## 2014-08-17 LAB — PSA: PSA: 0.6 ng/mL (ref 0.0–4.0)

## 2014-08-17 LAB — HEPATIC FUNCTION PANEL
ALT: 27 IU/L (ref 0–44)
AST: 22 IU/L (ref 0–40)
Albumin: 3.9 g/dL (ref 3.5–4.8)
Alkaline Phosphatase: 85 IU/L (ref 39–117)
Bilirubin Total: 0.6 mg/dL (ref 0.0–1.2)
Bilirubin, Direct: 0.18 mg/dL (ref 0.00–0.40)
Total Protein: 6.2 g/dL (ref 6.0–8.5)

## 2014-08-22 ENCOUNTER — Encounter: Payer: Self-pay | Admitting: Family Medicine

## 2014-10-22 ENCOUNTER — Other Ambulatory Visit: Payer: Self-pay | Admitting: Family Medicine

## 2014-10-22 NOTE — Telephone Encounter (Signed)
Last seen 08/13/14

## 2014-12-15 ENCOUNTER — Other Ambulatory Visit: Payer: Self-pay | Admitting: Vascular Surgery

## 2014-12-15 DIAGNOSIS — Z01812 Encounter for preprocedural laboratory examination: Secondary | ICD-10-CM | POA: Diagnosis not present

## 2014-12-15 LAB — CREATININE, SERUM: Creat: 0.97 mg/dL (ref 0.70–1.18)

## 2014-12-15 LAB — BUN: BUN: 15 mg/dL (ref 7–25)

## 2014-12-22 ENCOUNTER — Ambulatory Visit
Admission: RE | Admit: 2014-12-22 | Discharge: 2014-12-22 | Disposition: A | Discharge status: Home / Self Care | Payer: Medicare Other | Source: Ambulatory Visit | Attending: Vascular Surgery | Admitting: Vascular Surgery

## 2014-12-22 DIAGNOSIS — I739 Peripheral vascular disease, unspecified: Secondary | ICD-10-CM

## 2014-12-22 DIAGNOSIS — I714 Abdominal aortic aneurysm, without rupture, unspecified: Secondary | ICD-10-CM

## 2014-12-22 DIAGNOSIS — Z48812 Encounter for surgical aftercare following surgery on the circulatory system: Secondary | ICD-10-CM

## 2014-12-22 MED ORDER — IOPAMIDOL (ISOVUE-370) INJECTION 76%
75.0000 mL | Freq: Once | INTRAVENOUS | Status: AC | PRN
  Administered 2014-12-22: 75 mL via INTRAVENOUS

## 2014-12-28 ENCOUNTER — Encounter: Payer: Self-pay | Admitting: Vascular Surgery

## 2014-12-29 ENCOUNTER — Encounter: Payer: Self-pay | Admitting: Vascular Surgery

## 2014-12-29 ENCOUNTER — Ambulatory Visit (INDEPENDENT_AMBULATORY_CARE_PROVIDER_SITE_OTHER): Payer: Medicare Other | Admitting: Vascular Surgery

## 2014-12-29 ENCOUNTER — Ambulatory Visit (HOSPITAL_COMMUNITY)
Admission: RE | Admit: 2014-12-29 | Discharge: 2014-12-29 | Disposition: A | Discharge status: Home / Self Care | Payer: Medicare Other | Source: Ambulatory Visit | Attending: Vascular Surgery | Admitting: Vascular Surgery

## 2014-12-29 ENCOUNTER — Other Ambulatory Visit: Payer: Self-pay | Admitting: Vascular Surgery

## 2014-12-29 VITALS — BP 156/90 | HR 75 | Temp 98.0°F | Resp 16 | Ht 70.0 in | Wt 215.0 lb

## 2014-12-29 DIAGNOSIS — Z01818 Encounter for other preprocedural examination: Secondary | ICD-10-CM | POA: Insufficient documentation

## 2014-12-29 DIAGNOSIS — I739 Peripheral vascular disease, unspecified: Secondary | ICD-10-CM | POA: Diagnosis not present

## 2014-12-29 DIAGNOSIS — I6523 Occlusion and stenosis of bilateral carotid arteries: Secondary | ICD-10-CM

## 2014-12-29 DIAGNOSIS — I714 Abdominal aortic aneurysm, without rupture, unspecified: Secondary | ICD-10-CM

## 2014-12-29 DIAGNOSIS — I70209 Unspecified atherosclerosis of native arteries of extremities, unspecified extremity: Secondary | ICD-10-CM

## 2014-12-29 DIAGNOSIS — I10 Essential (primary) hypertension: Secondary | ICD-10-CM | POA: Diagnosis not present

## 2014-12-29 DIAGNOSIS — Z48812 Encounter for surgical aftercare following surgery on the circulatory system: Secondary | ICD-10-CM

## 2014-12-29 DIAGNOSIS — E785 Hyperlipidemia, unspecified: Secondary | ICD-10-CM | POA: Diagnosis not present

## 2014-12-29 NOTE — Progress Notes (Signed)
Filed Vitals:   12/29/14 1603 12/29/14 1610  BP: 174/95 156/90  Pulse: 77 75  Temp: 98 F (36.7 C)   TempSrc: Oral   Resp: 16   Height: 5\' 10"  (1.778 m)   Weight: 215 lb (97.523 kg)   SpO2: 96%

## 2014-12-29 NOTE — Progress Notes (Signed)
Patient ID: JI FELDNER, male   DOB: 10/15/1940, 74 y.o.   MRN: [id number]   [facility]  Patient name: [name] MRN: [id number] DOB: Jun 25, 1940 Sex: male  REASON FOR VISIT: Follow up of abdominal aortic aneurysm and peripheral vascular disease.  HPI: [name] is a 74 y.o. male who I saw in consultation on 06/30/2014 with an abdominal aortic aneurysm. Ultrasound on 05/26/2014 showed that the maximum diameter was 4.8 cm. It was noted that the aneurysm extended above the level of the renal arteries. The iliac arteries were somewhat ectatic. There was also atherosclerotic disease of the aorta and common iliac arteries. I explained that we would normally not consider elective repair and a normal risk patient unless the aneurysm reached 5.5 cm in maximum diameter. I recommended a follow up CT scan in 6 months. Since I saw him last, he denies any significant abdominal pain or back pain.  In addition, he had evidence of left lower extremity claudication and infrainguinal arterial occlusive disease on the left. He does have stable claudication of the left lower extremity. He can walk about 300 yards before experiencing symptoms. He experiences pain in his left calf which is brought on by ambulation and relieved with rest. He has no symptoms in the right lower extremity. He denies any thigh or hip claudication. He denies any rest pain or nonhealing ulcers.  He no longer smokes. He quit in 2005.  Past Medical History  Diagnosis Date  . Arteriosclerotic cardiovascular disease (ASCVD) 2006    CABG in 2006; normal EF  . Cerebrovascular disease 2007    hx of TIA in 08; duplex 60-80% R vertebral artery stenosis; moderate ASVD of the ICAs  . Peripheral vascular disease     Left femoral art. stent; bilateral renal art. stents-2007; ABIs-nl right; 0.80 left in 5/06; angio in 2007-95% bilat. renal; diffuse aortoiliac ASCVD +ulceration;     . Hypertension     LVH    . Hyperlipidemia   . Tobacco abuse, in remission     [id number]  . Abdominal aortic aneurysm     3.5 cm in 2011  . Adenomatous polyps   . Diverticulosis   . Degenerative joint disease of spine 2011    Lumbosacral and cervical; ant. discectomy, fusion and plate in 4158  . DIABETES MELLITUS, TYPE II 01/31/2010  . Sinus headache   . Insomnia   . ED (erectile dysfunction)   . CAD (coronary artery disease)   . Carotid stenosis   . Neuropathy   . Stroke    Family History  Problem Relation Age of Onset  . Stroke Father   . Pulmonary embolism Mother     Following hip fracture  . Colon cancer Brother   . Hypertension Brother    SOCIAL HISTORY: Social History  Substance Use Topics  . Smoking status: Former Smoker -- 1.00 packs/day for 50 years    Types: Cigarettes    Quit date: 10/18/2003  . Smokeless tobacco: Never Used     Comment: 50 pack years; quit in 2005   . Alcohol Use: No   Allergies  Allergen Reactions  . Lipitor [Atorvastatin] Other (See Comments)    Side effects-muscle aches   Current Outpatient Prescriptions  Medication Sig Dispense Refill  . ALPRAZolam (XANAX) 0.5 MG tablet Take 1 tablet (0.5 mg total) by mouth at bedtime as needed for anxiety. 30 tablet 5  . chlorzoxazone (PARAFON) 500 MG tablet TAKE 1 TABLET BY  MOUTH 3 TIMES A DAY AS NEEDED FOR SPASMS 30 tablet 0  . clopidogrel (PLAVIX) 75 MG tablet Take 1 tablet (75 mg total) by mouth daily. 30 tablet 5  . HYDROcodone-acetaminophen (NORCO) 5-325 MG per tablet Take 1-2 tablets by mouth every 4 (four) hours as needed for moderate pain. 30 tablet 0  . lisinopril (PRINIVIL,ZESTRIL) 20 MG tablet Take 1 tablet (20 mg total) by mouth 2 (two) times daily. 60 tablet 5  . Multiple Vitamin (MULTIVITAMIN) tablet Take 1 tablet by mouth daily.      . nortriptyline (PAMELOR) 25 MG capsule Take 1 capsule (25 mg total) by mouth at bedtime. 30 capsule 5  . Omega-3 Fatty Acids (FISH OIL) 1000 MG CAPS Take 1 capsule by mouth  daily.      . simvastatin (ZOCOR) 80 MG tablet Take 1 tablet (80 mg total) by mouth daily. 30 tablet 5  . temazepam (RESTORIL) 30 MG capsule take 1 capsule by mouth at bedtime if needed 30 capsule 5  . neomycin-polymyxin-hydrocortisone (CORTISPORIN) 3.5-10000-1 otic suspension INSTILL FOUR DROPS INTO EACH EAR 4 TIMES DAILY FOR 5 DAYS (Patient not taking: Reported on 12/29/2014) 10 mL 0   No current facility-administered medications for this visit.   REVIEW OF SYSTEMS: Valu.Nieves ] denotes positive finding; [  ] denotes negative finding  CARDIOVASCULAR:  [ ]  chest pain   [ ]  chest pressure   [ ]  palpitations   [ ]  orthopnea   [ ]  dyspnea on exertion   Valu.Nieves ] claudication - left calf   [ ]  rest pain   [ ]  DVT   [ ]  phlebitis PULMONARY:   [ ]  productive cough   [ ]  asthma   [ ]  wheezing NEUROLOGIC:   [ ]  weakness  [ ]  paresthesias  [ ]  aphasia  [ ]  amaurosis  [ ]  dizziness HEMATOLOGIC:   [ ]  bleeding problems   [ ]  clotting disorders MUSCULOSKELETAL:  [ ]  joint pain   [ ]  joint swelling [ ]  leg swelling GASTROINTESTINAL: [ ]   blood in stool  [ ]   hematemesis GENITOURINARY:  [ ]   dysuria  [ ]   hematuria PSYCHIATRIC:  [ ]  history of major depression INTEGUMENTARY:  [ ]  rashes  [ ]  ulcers CONSTITUTIONAL:  [ ]  fever   [ ]  chills  PHYSICAL EXAM: Filed Vitals:   12/29/14 1603 12/29/14 1610  BP: 174/95 156/90  Pulse: 77 75  Temp: 98 F (36.7 C)   TempSrc: Oral   Resp: 16   Height: 5\' 10"  (1.778 m)   Weight: 215 lb (97.523 kg)   SpO2: 96%    GENERAL: The patient is a well-nourished male, in no acute distress. The vital signs are documented above. CARDIAC: There is a regular rate and rhythm.  VASCULAR: I do not detect carotid bruits. On the right side, he has a palpable femoral and popliteal pulse. I cannot palpate pedal pulses although the right foot is warm and well-perfused. On the left side he has a palpable femoral pulse. I cannot palpate popliteal or pedal pulses on the left foot. There is no  significant lower extremity swelling. PULMONARY: There is good air exchange bilaterally without wheezing or rales. ABDOMEN: Soft and non-tender with normal pitched bowel sounds.  MUSCULOSKELETAL: There are no major deformities or cyanosis. NEUROLOGIC: No focal weakness or paresthesias are detected. SKIN: There are no ulcers or rashes noted. PSYCHIATRIC: The patient has a normal affect.  DATA:  CT ANGIOGRAM ABDOMEN PELVIS: I have  reviewed the images of the CT scan which shows that the maximum diameter of the abdominal aortic aneurysm is 4.9 cm.  I have independently interpreted his arterial Doppler study today which shows triphasic Doppler signals in the right dorsalis pedis and posterior tibial positions with an ABI of 99%. On the left side there is a monophasic posterior tibial signal and a monophasic dorsalis pedis signal. ABI on the left is 74%.  MEDICAL ISSUES:  ABDOMINAL AORTIC ANEURYSM: his aneurysm is 4.9 cm in maximum diameter. This has not changed in the last 6 months. He understands we would not consider elective repair unless the aneurysm reached 5.5 cm in maximum diameter. We have discussed the importance of making sure that his blood pressure is under good control. Fortunately he is not a smoker. I have ordered a follow up ultrasound in 6 months and I will see him back at that time. Of note, he does have some thrombus that approaches the renal arteries and therefore may not be an ideal candidate for an endovascular aneurysm repair if the aneurysm continues to enlarge.   PERIPHERAL VASCULAR DISEASE: his left lower extremity claudication is stable. I have ordered follow up ABIs in 1 year. I've encouraged him to stay as active as possible. He is on Plavix and he is on a statin.  HYPERTENSION: The patient's initial blood pressure today was elevated. We repeated this and this was still elevated. We have encouraged the patient to follow up with their primary care physician for management  of their blood pressure.  Return in about 6 months (around 07/01/2015).  Deitra Mayo [facility] [place]: [phone]

## 2014-12-30 NOTE — Addendum Note (Signed)
Addended by: Dorthula Rue L on: 12/30/2014 09:59 AM   Modules accepted: Orders

## 2015-02-03 ENCOUNTER — Telehealth: Payer: Self-pay | Admitting: Cardiovascular Disease

## 2015-02-03 NOTE — Telephone Encounter (Signed)
Spoke with [name]. [facility] so we can take him off of our list.

## 2015-02-07 DIAGNOSIS — L821 Other seborrheic keratosis: Secondary | ICD-10-CM | POA: Diagnosis not present

## 2015-02-07 DIAGNOSIS — L820 Inflamed seborrheic keratosis: Secondary | ICD-10-CM | POA: Diagnosis not present

## 2015-02-07 DIAGNOSIS — L570 Actinic keratosis: Secondary | ICD-10-CM | POA: Diagnosis not present

## 2015-02-07 DIAGNOSIS — C44629 Squamous cell carcinoma of skin of left upper limb, including shoulder: Secondary | ICD-10-CM | POA: Diagnosis not present

## 2015-02-07 DIAGNOSIS — D485 Neoplasm of uncertain behavior of skin: Secondary | ICD-10-CM | POA: Diagnosis not present

## 2015-02-10 DIAGNOSIS — C44629 Squamous cell carcinoma of skin of left upper limb, including shoulder: Secondary | ICD-10-CM | POA: Diagnosis not present

## 2015-02-14 ENCOUNTER — Encounter: Payer: Self-pay | Admitting: Family Medicine

## 2015-02-14 ENCOUNTER — Ambulatory Visit (INDEPENDENT_AMBULATORY_CARE_PROVIDER_SITE_OTHER): Payer: Medicare Other | Admitting: Family Medicine

## 2015-02-14 VITALS — BP 130/86 | Ht 70.0 in | Wt 216.2 lb

## 2015-02-14 DIAGNOSIS — Z79899 Other long term (current) drug therapy: Secondary | ICD-10-CM | POA: Diagnosis not present

## 2015-02-14 DIAGNOSIS — E785 Hyperlipidemia, unspecified: Secondary | ICD-10-CM | POA: Diagnosis not present

## 2015-02-14 DIAGNOSIS — Z23 Encounter for immunization: Secondary | ICD-10-CM | POA: Diagnosis not present

## 2015-02-14 DIAGNOSIS — I6523 Occlusion and stenosis of bilateral carotid arteries: Secondary | ICD-10-CM

## 2015-02-14 MED ORDER — NORTRIPTYLINE HCL 25 MG PO CAPS: 25.0000 mg | ORAL_CAPSULE | Freq: Every day | ORAL | Status: DC

## 2015-02-14 MED ORDER — LISINOPRIL 20 MG PO TABS: 20.0000 mg | ORAL_TABLET | Freq: Two times a day (BID) | ORAL | Status: DC

## 2015-02-14 MED ORDER — CLOPIDOGREL BISULFATE 75 MG PO TABS: 75.0000 mg | ORAL_TABLET | Freq: Every day | ORAL | Status: DC

## 2015-02-14 MED ORDER — NEOMYCIN-POLYMYXIN-HC 3.5-10000-1 OT SOLN: 3.0000 [drp] | Freq: Four times a day (QID) | OTIC | Status: DC

## 2015-02-14 MED ORDER — SIMVASTATIN 80 MG PO TABS: 80.0000 mg | ORAL_TABLET | Freq: Every day | ORAL | Status: DC

## 2015-02-14 NOTE — Progress Notes (Signed)
   Subjective:    Patient ID: [name], male    DOB: 08/16/1940, 74 y.o.   MRN: [id number]  Hypertension This is a chronic problem. The current episode started more than 1 year ago. The problem has been gradually improving since onset. There are no associated agents to hypertension. There are no known risk factors for coronary artery disease. Treatments tried: lisinopril. The current treatment provides moderate improvement. There are no compliance problems.    Patient states that he wants a refill on the Cortisporin drops for his ear discomfort.  Impression DAILY cong and drainage  Ear pain both sides, gets itchy at times,  Cort spor otic drps   Results for orders placed or performed in visit on 12/15/14  BUN  Result Value Ref Range   BUN 15 7 - 25 mg/dL  Creatinine, serum  Result Value Ref Range   Creat 0.97 0.70 - 1.18 mg/dL    Compliant with lipid medication. No obvious side effects. Has cut fat intake down. Pre-much never misses a dose.  Also compliant with nighttime nortriptyline and Restoril. Combination helps him rest at night.  His aneurysm continues to be followed. Scan reviewed with patient today at 4.9 cm is been not advised they will not surgerized until 5.5 Review of Systems No headache no chest pain no back pain abdominal pain no change in bowel habits    Objective:   Physical Exam  Alert vital stable HEENT slight scaling dissertation external ear [place] neck supple. Lungs clear heart regular in rhythm ankles trace edema      Assessment & Plan:  Impression 1 hypertension good control discussed #2 hyperlipidemia status uncertain #3 chronic external otitis #4 insomnia discussed definitely needs medication #5 abdominal aneurysm discussed plan flu shot today appropriate blood work medications refilled diet exercise discussed WSL

## 2015-02-18 ENCOUNTER — Other Ambulatory Visit: Payer: Self-pay | Admitting: Family Medicine

## 2015-02-18 NOTE — Telephone Encounter (Signed)
Ok six mo 

## 2015-05-31 ENCOUNTER — Encounter: Payer: Self-pay | Admitting: Cardiovascular Disease

## 2015-05-31 ENCOUNTER — Ambulatory Visit (INDEPENDENT_AMBULATORY_CARE_PROVIDER_SITE_OTHER): Payer: Medicare Other | Admitting: Cardiovascular Disease

## 2015-05-31 VITALS — BP 132/77 | HR 80 | Ht 70.0 in | Wt 220.0 lb

## 2015-05-31 DIAGNOSIS — I739 Peripheral vascular disease, unspecified: Secondary | ICD-10-CM | POA: Diagnosis not present

## 2015-05-31 DIAGNOSIS — I25810 Atherosclerosis of coronary artery bypass graft(s) without angina pectoris: Secondary | ICD-10-CM | POA: Diagnosis not present

## 2015-05-31 DIAGNOSIS — I714 Abdominal aortic aneurysm, without rupture, unspecified: Secondary | ICD-10-CM

## 2015-05-31 DIAGNOSIS — E782 Mixed hyperlipidemia: Secondary | ICD-10-CM

## 2015-05-31 DIAGNOSIS — I6523 Occlusion and stenosis of bilateral carotid arteries: Secondary | ICD-10-CM

## 2015-05-31 DIAGNOSIS — I10 Essential (primary) hypertension: Secondary | ICD-10-CM

## 2015-05-31 NOTE — Patient Instructions (Signed)
Continue all current medications. Your physician wants you to follow up in:  1 year.  You will receive a reminder letter in the mail one-two months in advance.  If you don't receive a letter, please call our office to schedule the follow up appointment   

## 2015-05-31 NOTE — Progress Notes (Signed)
Patient ID: [name], male   DOB: 03-29-1941, 75 y.o.   MRN: [id number]      SUBJECTIVE: The patient is a 75 year old male with a past medical history significant for coronary artery disease and CABG, abdominal aortic aneurysm, peripheral vascular disease (renal artery stenting), hypertension, TIA, diabetes, and hyperlipidemia.  He denies chest pain, leg swelling, palpitations, and shortness of breath. He has had left leg claudication for several years after 50-100 yards of walking.   ABIs on 12/29/14 showed right ABI 0.99 and left ABI 0.74, indicative of moderate left-sided arterial occlusive disease.  Sees vascular surgery (most recently in 12/2014).  ECG performed in the office today demonstrates normal sinus rhythm, heart rate 70 bpm, old inferior and anterior and apical infarct.   Review of Systems: As per "subjective", otherwise negative.  Allergies  Allergen Reactions  . Lipitor [Atorvastatin] Other (See Comments)    Side effects-muscle aches    Current Outpatient Prescriptions  Medication Sig Dispense Refill  . ALPRAZolam (XANAX) 0.5 MG tablet take 1 tablet by mouth at bedtime if needed FOR ANXIETY 30 tablet 5  . clopidogrel (PLAVIX) 75 MG tablet Take 1 tablet (75 mg total) by mouth daily. 30 tablet 5  . lisinopril (PRINIVIL,ZESTRIL) 20 MG tablet Take 1 tablet (20 mg total) by mouth 2 (two) times daily. 60 tablet 5  . Multiple Vitamin (MULTIVITAMIN) tablet Take 1 tablet by mouth daily.      Marland Kitchen neomycin-polymyxin-hydrocortisone (CORTISPORIN) otic solution Place 3 drops into both ears 4 (four) times daily. 10 mL 0  . nortriptyline (PAMELOR) 25 MG capsule Take 1 capsule (25 mg total) by mouth at bedtime. 30 capsule 5  . Omega-3 Fatty Acids (FISH OIL) 1000 MG CAPS Take 1 capsule by mouth daily.      . simvastatin (ZOCOR) 80 MG tablet Take 1 tablet (80 mg total) by mouth daily. 30 tablet 5  . temazepam (RESTORIL) 30 MG capsule take 1 capsule by mouth at bedtime AS NEEDED 30  capsule 5   No current facility-administered medications for this visit.    Past Medical History  Diagnosis Date  . Arteriosclerotic cardiovascular disease (ASCVD) 2006    CABG in 2006; normal EF  . Cerebrovascular disease 2007    hx of TIA in 08; duplex 60-80% R vertebral artery stenosis; moderate ASVD of the ICAs  . Peripheral vascular disease ([place])     Left femoral art. stent; bilateral renal art. stents-2007; ABIs-nl right; 0.80 left in 5/06; angio in 2007-95% bilat. renal; diffuse aortoiliac ASCVD +ulceration;     . Hypertension     LVH  . Hyperlipidemia   . Tobacco abuse, in remission     [id number]  . Abdominal aortic aneurysm (HCC)     3.5 cm in 2011  . Adenomatous polyps   . Diverticulosis   . Degenerative joint disease of spine 2011    Lumbosacral and cervical; ant. discectomy, fusion and plate in [facility]  . DIABETES MELLITUS, TYPE II 01/31/2010  . Sinus headache   . Insomnia   . ED (erectile dysfunction)   . CAD (coronary artery disease)   . Carotid stenosis   . Neuropathy ([place])   . Stroke [facility])     Past Surgical History  Procedure Laterality Date  . Coronary artery bypass graft  2006    LIMA to LAD; SVG to marginal; SVG to L posterolateral; nondoninant RCA   . Colonoscopy w/ polypectomy  2010  . Anterior fusion cervical spine  2005   .  Lumbar spine surgery  1985     Discectomy and fusion  . Mass excision Left 01/22/2014    Procedure: EXCISION LEFT WRIST MASS;  Surgeon: Mcarthur Rossetti, MD;  Location: [facility] ORS;  Service: Orthopedics;  Laterality: Left;    Social History   Social History  . Marital Status: Married    Spouse Name: N/A  . Number of Children: N/A  . Years of Education: N/A   Occupational History  . Retired    Social History Main Topics  . Smoking status: Former Smoker -- 1.00 packs/day for 50 years    Types: Cigarettes    Start date: 05/10/1952    Quit date: 10/18/2003  . Smokeless tobacco: Never Used     Comment: 50 pack years;  quit in 2005   . Alcohol Use: No  . Drug Use: No  . Sexual Activity: Not on file   Other Topics Concern  . Not on file   Social History Narrative   Retired; married; does not get regular exercise.      Filed Vitals:   05/31/15 1130  BP: 132/77  Pulse: 80  Height: 5\' 10"  (1.778 m)  Weight: 220 lb (99.791 kg)    PHYSICAL EXAM General: NAD HEENT: Normal. Neck: No JVD, no thyromegaly. Lungs: Clear to auscultation bilaterally with normal respiratory effort. CV: Nondisplaced PMI. Regular rate and rhythm, normal S1/S2, no S3/S4, no murmur. No pretibial or periankle edema. No carotid bruit. Diminished pedal pulses in left leg. Abdomen: Soft, nontender, no hepatosplenomegaly, obese, mild distention.  Neurologic: Alert and oriented x 3.  Psych: Normal affect. Skin: Normal. Musculoskeletal: Normal range of motion, no gross deformities. Extremities: No clubbing or cyanosis.   ECG: Most recent ECG reviewed.      ASSESSMENT AND PLAN: 1. CAD with CABG: Stable ischemic heart disease. Continue therapy with simvastatin. Not on ASA as he is on Plavix for h/o TIA apparently.  2. Essential HTN: Well controlled on lisinopril 20 mg bid. No changes.  3. Hyperlipidemia: Lipids on 09/05/14 showed TC 126, TG 152, HDL 36, LDL 60. Continue present dose of statin therapy.  4. PVD: Follows with Dr. Scot Dock. Has left leg claudication.  ABI's noted above (left-sided moderate occlusive disease). On Plavix and statin.  5. AAA: Large 4.8 x 4.8 cm fusiform aneurysm on 05/26/14. Follows with Dr. Scot Dock. Advised against heavy lifting (he had questions about this).  6. Carotid artery stenosis: Had 60-79% RICA stenosis and 123456 LICA stenosis on [id number]. Follows with Dr. Scot Dock. On Plavix and statin.  Dispo: f/u 1 year.  Kate Sable, M.D., F.A.C.C.

## 2015-06-29 ENCOUNTER — Encounter: Payer: Self-pay | Admitting: Vascular Surgery

## 2015-07-01 ENCOUNTER — Ambulatory Visit (HOSPITAL_COMMUNITY)
Admission: RE | Admit: 2015-07-01 | Discharge: 2015-07-01 | Disposition: A | Discharge status: Home / Self Care | Payer: Medicare Other | Source: Ambulatory Visit | Attending: Vascular Surgery | Admitting: Vascular Surgery

## 2015-07-01 ENCOUNTER — Encounter: Payer: Self-pay | Admitting: Vascular Surgery

## 2015-07-01 ENCOUNTER — Ambulatory Visit (INDEPENDENT_AMBULATORY_CARE_PROVIDER_SITE_OTHER): Payer: Medicare Other | Admitting: Vascular Surgery

## 2015-07-01 VITALS — BP 141/80 | HR 80 | Temp 96.8°F | Resp 16 | Ht 70.0 in | Wt 219.0 lb

## 2015-07-01 DIAGNOSIS — I714 Abdominal aortic aneurysm, without rupture, unspecified: Secondary | ICD-10-CM

## 2015-07-01 DIAGNOSIS — Z720 Tobacco use: Secondary | ICD-10-CM | POA: Insufficient documentation

## 2015-07-01 DIAGNOSIS — I700 Atherosclerosis of aorta: Secondary | ICD-10-CM | POA: Insufficient documentation

## 2015-07-01 DIAGNOSIS — E1140 Type 2 diabetes mellitus with diabetic neuropathy, unspecified: Secondary | ICD-10-CM | POA: Insufficient documentation

## 2015-07-01 DIAGNOSIS — I6523 Occlusion and stenosis of bilateral carotid arteries: Secondary | ICD-10-CM | POA: Diagnosis not present

## 2015-07-01 DIAGNOSIS — I70209 Unspecified atherosclerosis of native arteries of extremities, unspecified extremity: Secondary | ICD-10-CM | POA: Insufficient documentation

## 2015-07-01 DIAGNOSIS — E785 Hyperlipidemia, unspecified: Secondary | ICD-10-CM | POA: Diagnosis not present

## 2015-07-01 DIAGNOSIS — I10 Essential (primary) hypertension: Secondary | ICD-10-CM | POA: Insufficient documentation

## 2015-07-01 NOTE — Progress Notes (Signed)
[facility] of [place]  Patient name: [name] MRN: [id number] DOB: [birth year] Sex: male  REASON FOR VISIT: follow up of abdominal aortic aneurysm.  HPI: [name] is a 75 y.o. male who I last saw on 12/29/2014. I been following him with an abdominal aortic aneurysm. In addition he has left lower extremity claudication with known infrainguinal arterial occlusive disease on the left. He comes in for a 6 month follow up visit. At the time of his last visit, CT angiogram showed that the maximum diameter of his aneurysm was 4.9 cm. Arterial Doppler study at that time showed a normal ABI on the right with an ABI of 74% on the left.  Since I saw him last, he denies any history of abdominal pain or back pain. He does have stable claudication in the left calf. This is brought on by ambulation and relieved with rest. It occurs approximately 200-300 yards. He has no symptoms in the right leg. He denies any history of rest pain or nonhealing ulcers. He is not a smoker.  Past Medical History  Diagnosis Date  . Arteriosclerotic cardiovascular disease (ASCVD) 2006    CABG in 2006; normal EF  . Cerebrovascular disease 2007    hx of TIA in 08; duplex 60-80% R vertebral artery stenosis; moderate ASVD of the ICAs  . Peripheral vascular disease ([place])     Left femoral art. stent; bilateral renal art. stents-2007; ABIs-nl right; 0.80 left in 5/06; angio in 2007-95% bilat. renal; diffuse aortoiliac ASCVD +ulceration;     . Hypertension     LVH  . Hyperlipidemia   . Tobacco abuse, in remission     [id number]  . Abdominal aortic aneurysm (HCC)     3.5 cm in 2011  . Adenomatous polyps   . Diverticulosis   . Degenerative joint disease of spine 2011    Lumbosacral and cervical; ant. discectomy, fusion and plate in [facility]  . DIABETES MELLITUS, TYPE II 01/31/2010  . Sinus headache   . Insomnia   . ED (erectile dysfunction)   . CAD (coronary artery disease)   . Carotid stenosis     . Neuropathy ([place])   . Stroke [facility])     Family History  Problem Relation Age of Onset  . Stroke Father   . Pulmonary embolism Mother     Following hip fracture  . Colon cancer Brother   . Hypertension Brother     SOCIAL HISTORY: Social History  Substance Use Topics  . Smoking status: Former Smoker -- 1.00 packs/day for 50 years    Types: Cigarettes    Start date: 05/10/1952    Quit date: 10/18/2003  . Smokeless tobacco: Never Used     Comment: 50 pack years; quit in 2005   . Alcohol Use: No    Allergies  Allergen Reactions  . Lipitor [Atorvastatin] Other (See Comments)    Side effects-muscle aches    Current Outpatient Prescriptions  Medication Sig Dispense Refill  . ALPRAZolam (XANAX) 0.5 MG tablet take 1 tablet by mouth at bedtime if needed FOR ANXIETY 30 tablet 5  . clopidogrel (PLAVIX) 75 MG tablet Take 1 tablet (75 mg total) by mouth daily. 30 tablet 5  . lisinopril (PRINIVIL,ZESTRIL) 20 MG tablet Take 1 tablet (20 mg total) by mouth 2 (two) times daily. 60 tablet 5  . Multiple Vitamin (MULTIVITAMIN) tablet Take 1 tablet by mouth daily.      Marland Kitchen neomycin-polymyxin-hydrocortisone (CORTISPORIN) otic solution Place 3 drops  into both ears 4 (four) times daily. 10 mL 0  . nortriptyline (PAMELOR) 25 MG capsule Take 1 capsule (25 mg total) by mouth at bedtime. 30 capsule 5  . Omega-3 Fatty Acids (FISH OIL) 1000 MG CAPS Take 1 capsule by mouth daily.      . simvastatin (ZOCOR) 80 MG tablet Take 1 tablet (80 mg total) by mouth daily. 30 tablet 5  . temazepam (RESTORIL) 30 MG capsule take 1 capsule by mouth at bedtime AS NEEDED 30 capsule 5   No current facility-administered medications for this visit.    REVIEW OF SYSTEMS:  [X]  denotes positive finding, [ ]  denotes negative finding Cardiac  Comments:  Chest pain or chest pressure:    Shortness of breath upon exertion:    Short of breath when lying flat:    Irregular heart rhythm:        Vascular    Pain in calf,  thigh, or hip brought on by ambulation: X Left calf  Pain in feet at night that wakes you up from your sleep:     Blood clot in your veins:    Leg swelling:         Pulmonary    Oxygen at home:    Productive cough:     Wheezing:         Neurologic    Sudden weakness in arms or legs:     Sudden numbness in arms or legs:     Sudden onset of difficulty speaking or slurred speech:    Temporary loss of vision in one eye:     Problems with dizziness:         Gastrointestinal    Blood in stool:     Vomited blood:         Genitourinary    Burning when urinating:     Blood in urine:        Psychiatric    Major depression:         Hematologic    Bleeding problems:    Problems with blood clotting too easily:        Skin    Rashes or ulcers:        Constitutional    Fever or chills:      PHYSICAL EXAM: Filed Vitals:   07/01/15 0909 07/01/15 0911  BP: 146/81 141/80  Pulse: 78 80  Temp: 96.8 F (36 C)   Resp: 16   Height: 5\' 10"  (1.778 m)   Weight: 219 lb (99.338 kg)   SpO2: 97%     GENERAL: The patient is a well-nourished male, in no acute distress. The vital signs are documented above. CARDIAC: There is a regular rate and rhythm.  VASCULAR: I do not detect carotid bruits. Has palpable femoral pulses. I cannot palpate popliteal or pedal pulses. PULMONARY: There is good air exchange bilaterally without wheezing or rales. ABDOMEN: Soft and non-tender with normal pitched bowel sounds. His aneurysm is palpable and nontender. MUSCULOSKELETAL: There are no major deformities or cyanosis. NEUROLOGIC: No focal weakness or paresthesias are detected. SKIN: There are no ulcers or rashes noted. PSYCHIATRIC: The patient has a normal affect.  DATA:   DUPLEX OF ABDOMINAL AORTA: The maximum diameter of his abdominal aortic aneurysm is 4.65 cm. Thus this is stable in size. By CT it was 4.96 months ago. The right common iliac artery measures 1.5 cm in maximum diameter. The left  common iliac artery measures 1.6 cm in maximum diameter.  MEDICAL ISSUES:  ABDOMINAL AORTIC ANEURYSM: Has been no change in the size of his abdominal aortic aneurysm. By duplex today this is 4.7 cm. It was 4.9 cm by CT scan 6 months ago. He is asymptomatic. He understands we would not consider elective repair unless it reached 5.5 cm in maximum diameter. He is not a smoker. His blood pressure appears to be under good control. I have ordered a follow up duplex of his aneurysm in 6 months. I'll see him back at that time. He knows to call sooner if he has problems.  LEFT LOWER EXTREMITY CLAUDICATION: Left lower extremity claudication is stable. I've encouraged him to continue to ambulate as much as possible. He is not a smoker. I've ordered follow up ABI in 6 months when he returns for his follow up visit.  Deitra Mayo [facility] [place]: [phone]

## 2015-07-01 NOTE — Addendum Note (Signed)
Addended by: Dorthula Rue L on: 07/01/2015 12:04 PM   Modules accepted: Orders

## 2015-07-01 NOTE — Progress Notes (Signed)
Filed Vitals:   07/01/15 0909 07/01/15 0911  BP: 146/81 141/80  Pulse: 78 80  Temp: 96.8 F (36 C)   Resp: 16   Height: 5\' 10"  (1.778 m)   Weight: 219 lb (99.338 kg)   SpO2: 97%

## 2015-07-06 ENCOUNTER — Other Ambulatory Visit (HOSPITAL_COMMUNITY): Payer: Medicare Other

## 2015-07-06 ENCOUNTER — Ambulatory Visit: Payer: Medicare Other | Admitting: Vascular Surgery

## 2015-07-25 ENCOUNTER — Encounter: Payer: Self-pay | Admitting: *Deleted

## 2015-08-10 DIAGNOSIS — Z85828 Personal history of other malignant neoplasm of skin: Secondary | ICD-10-CM | POA: Diagnosis not present

## 2015-08-10 DIAGNOSIS — L570 Actinic keratosis: Secondary | ICD-10-CM | POA: Diagnosis not present

## 2015-08-10 DIAGNOSIS — D485 Neoplasm of uncertain behavior of skin: Secondary | ICD-10-CM | POA: Diagnosis not present

## 2015-08-10 DIAGNOSIS — D0439 Carcinoma in situ of skin of other parts of face: Secondary | ICD-10-CM | POA: Diagnosis not present

## 2015-08-15 ENCOUNTER — Ambulatory Visit (INDEPENDENT_AMBULATORY_CARE_PROVIDER_SITE_OTHER): Payer: Medicare Other | Admitting: Family Medicine

## 2015-08-15 ENCOUNTER — Encounter: Payer: Self-pay | Admitting: Family Medicine

## 2015-08-15 VITALS — BP 150/92 | Ht 70.0 in | Wt 219.4 lb

## 2015-08-15 DIAGNOSIS — Z79899 Other long term (current) drug therapy: Secondary | ICD-10-CM | POA: Diagnosis not present

## 2015-08-15 DIAGNOSIS — Z0000 Encounter for general adult medical examination without abnormal findings: Secondary | ICD-10-CM

## 2015-08-15 DIAGNOSIS — E785 Hyperlipidemia, unspecified: Secondary | ICD-10-CM

## 2015-08-15 DIAGNOSIS — I10 Essential (primary) hypertension: Secondary | ICD-10-CM

## 2015-08-15 MED ORDER — TEMAZEPAM 30 MG PO CAPS: 30.0000 mg | ORAL_CAPSULE | Freq: Every evening | ORAL | Status: DC | PRN

## 2015-08-15 MED ORDER — NORTRIPTYLINE HCL 25 MG PO CAPS: 25.0000 mg | ORAL_CAPSULE | Freq: Every day | ORAL | Status: DC

## 2015-08-15 MED ORDER — LISINOPRIL 20 MG PO TABS: 20.0000 mg | ORAL_TABLET | Freq: Two times a day (BID) | ORAL | Status: DC

## 2015-08-15 MED ORDER — SIMVASTATIN 80 MG PO TABS: 80.0000 mg | ORAL_TABLET | Freq: Every day | ORAL | Status: DC

## 2015-08-15 MED ORDER — ALPRAZOLAM 1 MG PO TABS: 1.0000 mg | ORAL_TABLET | Freq: Every evening | ORAL | Status: DC | PRN

## 2015-08-15 MED ORDER — CLOPIDOGREL BISULFATE 75 MG PO TABS: 75.0000 mg | ORAL_TABLET | Freq: Every day | ORAL | Status: DC

## 2015-08-15 NOTE — Progress Notes (Signed)
   Subjective:    Patient ID: [name], male    DOB: Aug 26, 1940, 75 y.o.   MRN: [id number]  HPI  AWV- Annual Wellness Visit  The patient was seen for their annual wellness visit. The patient's past medical history, surgical history, and family history were reviewed. Pertinent vaccines were reviewed ( tetanus, pneumonia, shingles, flu) The patient's medication list was reviewed and updated.  The height and weight were entered. The patient's current BMI is:31.48  Cognitive screening was completed. Outcome of Mini - Cog: pass  Falls within the past 6 months:none  Current tobacco usage: none (All patients who use tobacco were given written and verbal information on quitting)  Recent listing of emergency department/hospitalizations over the past year were reviewed.  current specialist the patient sees on a regular basis: dr dixon-vein dr   Elvin So annual wellness visit patient questionnaire was reviewed.  A written screening schedule for the patient for the next 5-10 years was given. Appropriate discussion of followup regarding next visit was discussed.  Patient compliant with blood pressure medication. No obvious side effects. Watching salt intake. Does not miss a dose. Next  Compliant with lipid medicines. Also handling well. Positive coronary artery disease history. States has helped lipids in the past will blood work reviewed.  Positive family history of colon cancer, yet patient declines getting one done in cell. Next  Challenge with insomnia. Feels he needs a stronger dose on the Xanax   Review of Systems  Constitutional: Negative for fever, activity change and appetite change.  HENT: Negative for congestion and rhinorrhea.   Eyes: Negative for discharge.  Respiratory: Negative for cough and wheezing.   Cardiovascular: Negative for chest pain.  Gastrointestinal: Negative for vomiting, abdominal pain and blood in stool.  Genitourinary: Negative for frequency and  difficulty urinating.  Musculoskeletal: Negative for neck pain.  Skin: Negative for rash.  Allergic/Immunologic: Negative for environmental allergies and food allergies.  Neurological: Negative for weakness and headaches.  Psychiatric/Behavioral: Negative for agitation.  All other systems reviewed and are negative.      Objective:   Physical Exam  Constitutional: He appears well-developed and well-nourished.  HENT:  Head: Normocephalic and atraumatic.  Right Ear: External ear normal.  Left Ear: External ear normal.  Nose: Nose normal.  Mouth/Throat: Oropharynx is clear and moist.  Eyes: EOM are normal. Pupils are equal, round, and reactive to light.  Neck: Normal range of motion. Neck supple. No thyromegaly present.  Cardiovascular: Normal rate, regular rhythm and normal heart sounds.   No murmur heard. Pulmonary/Chest: Effort normal and breath sounds normal. No respiratory distress. He has no wheezes.  Abdominal: Soft. Bowel sounds are normal. He exhibits no distension and no mass. There is no tenderness.  Genitourinary: Penis normal.  Musculoskeletal: Normal range of motion. He exhibits no edema.  Lymphadenopathy:    He has no cervical adenopathy.  Neurological: He is alert. He exhibits normal muscle tone.  Skin: Skin is warm and dry. No erythema.  Psychiatric: He has a normal mood and affect. His behavior is normal. Judgment normal.  Vitals reviewed.         Assessment & Plan:  Impression 1 well adult exam #2 hypertension good control to maintain same meds meds reviewed #3 hyperlipidemia previous good control prior blood work reviewed #4 insomnia ongoing need for more meds plan increase Xanax. Chronic blood pressure lipid meds refilled. Diet exercise discussed. Appropriate blood work. Recheck in 6 months. Patient declines colonoscopy [facility]

## 2015-08-16 DIAGNOSIS — H25013 Cortical age-related cataract, bilateral: Secondary | ICD-10-CM | POA: Diagnosis not present

## 2015-08-16 DIAGNOSIS — H25011 Cortical age-related cataract, right eye: Secondary | ICD-10-CM | POA: Diagnosis not present

## 2015-08-16 DIAGNOSIS — H2511 Age-related nuclear cataract, right eye: Secondary | ICD-10-CM | POA: Diagnosis not present

## 2015-08-16 DIAGNOSIS — H2513 Age-related nuclear cataract, bilateral: Secondary | ICD-10-CM | POA: Diagnosis not present

## 2015-08-22 DIAGNOSIS — Z79899 Other long term (current) drug therapy: Secondary | ICD-10-CM | POA: Diagnosis not present

## 2015-08-22 DIAGNOSIS — E785 Hyperlipidemia, unspecified: Secondary | ICD-10-CM | POA: Diagnosis not present

## 2015-08-23 LAB — HEPATIC FUNCTION PANEL
ALT: 30 IU/L (ref 0–44)
AST: 26 IU/L (ref 0–40)
Albumin: 4 g/dL (ref 3.5–4.8)
Alkaline Phosphatase: 89 IU/L (ref 39–117)
Bilirubin Total: 0.7 mg/dL (ref 0.0–1.2)
Bilirubin, Direct: 0.2 mg/dL (ref 0.00–0.40)
Total Protein: 6.7 g/dL (ref 6.0–8.5)

## 2015-08-23 LAB — BASIC METABOLIC PANEL
BUN/Creatinine Ratio: 13 (ref 10–24)
BUN: 13 mg/dL (ref 8–27)
CO2: 20 mmol/L (ref 18–29)
Calcium: 9.4 mg/dL (ref 8.6–10.2)
Chloride: 100 mmol/L (ref 96–106)
Creatinine, Ser: 0.97 mg/dL (ref 0.76–1.27)
GFR calc Af Amer: 87 mL/min/{1.73_m2} (ref >59)
GFR calc non Af Amer: 76 mL/min/{1.73_m2} (ref >59)
Glucose: 133 mg/dL — ABNORMAL HIGH (ref 65–99)
Potassium: 5.3 mmol/L — ABNORMAL HIGH (ref 3.5–5.2)
Sodium: 141 mmol/L (ref 134–144)

## 2015-08-23 LAB — LIPID PANEL
Chol/HDL Ratio: 3.3 ratio units (ref 0.0–5.0)
Cholesterol, Total: 112 mg/dL (ref 100–199)
HDL: 34 mg/dL — ABNORMAL LOW (ref >39)
LDL Calculated: 40 mg/dL (ref 0–99)
Triglycerides: 192 mg/dL — ABNORMAL HIGH (ref 0–149)
VLDL Cholesterol Cal: 38 mg/dL (ref 5–40)

## 2015-08-28 ENCOUNTER — Encounter: Payer: Self-pay | Admitting: Family Medicine

## 2015-09-02 ENCOUNTER — Other Ambulatory Visit (HOSPITAL_COMMUNITY): Payer: Medicare Other

## 2015-09-06 ENCOUNTER — Encounter (HOSPITAL_COMMUNITY): Admission: RE | Payer: Self-pay | Source: Ambulatory Visit

## 2015-09-06 ENCOUNTER — Ambulatory Visit (HOSPITAL_COMMUNITY): Admission: RE | Admit: 2015-09-06 | Payer: Medicare Other | Source: Ambulatory Visit | Admitting: Ophthalmology

## 2015-09-06 SURGERY — PHACOEMULSIFICATION, CATARACT, WITH IOL INSERTION
Anesthesia: Monitor Anesthesia Care | Laterality: Right

## 2015-09-07 DIAGNOSIS — H2511 Age-related nuclear cataract, right eye: Secondary | ICD-10-CM | POA: Diagnosis not present

## 2015-09-15 ENCOUNTER — Other Ambulatory Visit (HOSPITAL_COMMUNITY): Payer: Medicare Other

## 2015-09-15 DIAGNOSIS — C44329 Squamous cell carcinoma of skin of other parts of face: Secondary | ICD-10-CM | POA: Diagnosis not present

## 2015-09-20 ENCOUNTER — Ambulatory Visit (HOSPITAL_COMMUNITY): Admission: RE | Admit: 2015-09-20 | Payer: Medicare Other | Source: Ambulatory Visit | Admitting: Ophthalmology

## 2015-09-20 ENCOUNTER — Encounter (HOSPITAL_COMMUNITY): Admission: RE | Payer: Self-pay | Source: Ambulatory Visit

## 2015-09-20 SURGERY — PHACOEMULSIFICATION, CATARACT, WITH IOL INSERTION
Anesthesia: Monitor Anesthesia Care | Laterality: Left

## 2015-09-21 DIAGNOSIS — H5703 Miosis: Secondary | ICD-10-CM | POA: Diagnosis not present

## 2015-09-21 DIAGNOSIS — H2511 Age-related nuclear cataract, right eye: Secondary | ICD-10-CM | POA: Diagnosis not present

## 2015-09-21 DIAGNOSIS — H2512 Age-related nuclear cataract, left eye: Secondary | ICD-10-CM | POA: Diagnosis not present

## 2015-09-21 DIAGNOSIS — H21561 Pupillary abnormality, right eye: Secondary | ICD-10-CM | POA: Diagnosis not present

## 2015-09-28 DIAGNOSIS — H2512 Age-related nuclear cataract, left eye: Secondary | ICD-10-CM | POA: Diagnosis not present

## 2016-01-02 ENCOUNTER — Encounter: Payer: Self-pay | Admitting: Vascular Surgery

## 2016-01-04 ENCOUNTER — Ambulatory Visit (HOSPITAL_COMMUNITY)
Admission: RE | Admit: 2016-01-04 | Discharge: 2016-01-04 | Disposition: A | Discharge status: Home / Self Care | Payer: Medicare Other | Source: Ambulatory Visit | Attending: Vascular Surgery | Admitting: Vascular Surgery

## 2016-01-04 ENCOUNTER — Ambulatory Visit (INDEPENDENT_AMBULATORY_CARE_PROVIDER_SITE_OTHER)
Admission: RE | Admit: 2016-01-04 | Discharge: 2016-01-04 | Disposition: A | Discharge status: Home / Self Care | Payer: Medicare Other | Source: Ambulatory Visit | Attending: Vascular Surgery | Admitting: Vascular Surgery

## 2016-01-04 ENCOUNTER — Ambulatory Visit: Payer: Medicare Other | Admitting: Vascular Surgery

## 2016-01-04 ENCOUNTER — Encounter: Payer: Self-pay | Admitting: Vascular Surgery

## 2016-01-04 ENCOUNTER — Ambulatory Visit (INDEPENDENT_AMBULATORY_CARE_PROVIDER_SITE_OTHER): Payer: Medicare Other | Admitting: Vascular Surgery

## 2016-01-04 ENCOUNTER — Encounter (HOSPITAL_COMMUNITY): Payer: Medicare Other

## 2016-01-04 VITALS — BP 167/84 | HR 82 | Temp 97.0°F | Resp 16 | Ht 70.0 in | Wt 214.0 lb

## 2016-01-04 DIAGNOSIS — I6523 Occlusion and stenosis of bilateral carotid arteries: Secondary | ICD-10-CM | POA: Diagnosis not present

## 2016-01-04 DIAGNOSIS — I10 Essential (primary) hypertension: Secondary | ICD-10-CM | POA: Insufficient documentation

## 2016-01-04 DIAGNOSIS — I739 Peripheral vascular disease, unspecified: Secondary | ICD-10-CM

## 2016-01-04 DIAGNOSIS — E785 Hyperlipidemia, unspecified: Secondary | ICD-10-CM | POA: Diagnosis not present

## 2016-01-04 DIAGNOSIS — I714 Abdominal aortic aneurysm, without rupture, unspecified: Secondary | ICD-10-CM

## 2016-01-04 DIAGNOSIS — I708 Atherosclerosis of other arteries: Secondary | ICD-10-CM | POA: Diagnosis not present

## 2016-01-04 DIAGNOSIS — Z720 Tobacco use: Secondary | ICD-10-CM | POA: Diagnosis not present

## 2016-01-04 DIAGNOSIS — R0989 Other specified symptoms and signs involving the circulatory and respiratory systems: Secondary | ICD-10-CM | POA: Diagnosis not present

## 2016-01-04 DIAGNOSIS — E1142 Type 2 diabetes mellitus with diabetic polyneuropathy: Secondary | ICD-10-CM | POA: Diagnosis not present

## 2016-01-04 DIAGNOSIS — I2510 Atherosclerotic heart disease of native coronary artery without angina pectoris: Secondary | ICD-10-CM | POA: Diagnosis not present

## 2016-01-04 DIAGNOSIS — I700 Atherosclerosis of aorta: Secondary | ICD-10-CM | POA: Diagnosis not present

## 2016-01-04 DIAGNOSIS — R938 Abnormal findings on diagnostic imaging of other specified body structures: Secondary | ICD-10-CM | POA: Insufficient documentation

## 2016-01-04 NOTE — Progress Notes (Signed)
Patient name: [name] MRN: [id number] DOB: 1940-06-08 Sex: male  REASON FOR VISIT: Follow up of abdominal aortic aneurysm.  HPI: [name] is a 75 y.o. male who I last saw him on 07/01/2015. He has an abdominal aortic aneurysm and also has been followed with left lower extremity claudication. At the time of his last visit, the aneurysm had not changed in size. By duplex the aneurysm was 4.7 cm in maximum diameter. CT scan 6 months earlier showed that the aneurysm was 4.9 cm in maximum diameter. He was set up for 6 month follow up visit.  He denies any abdominal pain or back pain.  He does have stable left calf claudication. He denies thigh or hip claudication. He denies rest pain or nonhealing ulcers. He has no symptoms in the right lower extremity.  He is not a smoker.  Past Medical History:  Diagnosis Date  . Abdominal aortic aneurysm (HCC)    3.5 cm in 2011  . Adenomatous polyps   . Arteriosclerotic cardiovascular disease (ASCVD) 2006   CABG in 2006; normal EF  . CAD (coronary artery disease)   . Carotid stenosis   . 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 Degenerative joint disease of spine 2011   Lumbosacral and cervical; ant. discectomy, fusion and plate in [facility]  . DIABETES MELLITUS, TYPE II 01/31/2010  . Diverticulosis   . ED (erectile dysfunction)   . Hyperlipidemia   . Hypertension    LVH  . Insomnia   . Neuropathy ([place])   . Peripheral vascular disease ([place])    Left femoral art. stent; bilateral renal art. stents-2007; ABIs-nl right; 0.80 left in 5/06; angio in 2007-95% bilat. renal; diffuse aortoiliac ASCVD +ulceration;     . Sinus headache   . Stroke ([place])   . Tobacco abuse, in remission    [id number]    Family History  Problem Relation Age of Onset  . Stroke Father   . Pulmonary embolism Mother     Following hip fracture  . Colon cancer Brother   . Hypertension  Brother     SOCIAL HISTORY: Social History  Substance Use Topics  . Smoking status: Former Smoker    Packs/day: 1.00    Years: 50.00    Types: Cigarettes    Start date: 05/10/1952    Quit date: 10/18/2003  . Smokeless tobacco: Never Used     Comment: 50 pack years; quit in 2005   . Alcohol use No    Allergies  Allergen Reactions  . Lipitor [Atorvastatin] Other (See Comments)    Side effects-muscle aches    Current Outpatient Prescriptions  Medication Sig Dispense Refill  . ALPRAZolam (XANAX) 1 MG tablet Take 1 tablet (1 mg total) by mouth at bedtime as needed for anxiety. 30 tablet 5  . clopidogrel (PLAVIX) 75 MG tablet Take 1 tablet (75 mg total) by mouth daily. 30 tablet 5  . lisinopril (PRINIVIL,ZESTRIL) 20 MG tablet Take 1 tablet (20 mg total) by mouth 2 (two) times daily. 60 tablet 5  . Multiple Vitamin (MULTIVITAMIN) tablet Take 1 tablet by mouth daily.      Marland Kitchen neomycin-polymyxin-hydrocortisone (CORTISPORIN) otic solution Place 3 drops into both ears 4 (four) times daily. 10 mL 0  . nortriptyline (PAMELOR) 25 MG capsule Take 1 capsule (25 mg total) by mouth at bedtime. 30 capsule 5  . Omega-3 Fatty Acids (FISH OIL)  1000 MG CAPS Take 1 capsule by mouth daily.      . simvastatin (ZOCOR) 80 MG tablet Take 1 tablet (80 mg total) by mouth daily. 30 tablet 5  . temazepam (RESTORIL) 30 MG capsule Take 1 capsule (30 mg total) by mouth at bedtime as needed. 30 capsule 5   No current facility-administered medications for this visit.     REVIEW OF SYSTEMS:  [X]  denotes positive finding, [ ]  denotes negative finding Cardiac  Comments:  Chest pain or chest pressure:    Shortness of breath upon exertion:    Short of breath when lying flat:    Irregular heart rhythm:        Vascular    Pain in calf, thigh, or hip brought on by ambulation:    Pain in feet at night that wakes you up from your sleep:     Blood clot in your veins:    Leg swelling:         Pulmonary    Oxygen at  home:    Productive cough:     Wheezing:         Neurologic    Sudden weakness in arms or legs:     Sudden numbness in arms or legs:     Sudden onset of difficulty speaking or slurred speech:    Temporary loss of vision in one eye:     Problems with dizziness:         Gastrointestinal    Blood in stool:     Vomited blood:         Genitourinary    Burning when urinating:     Blood in urine:        Psychiatric    Major depression:         Hematologic    Bleeding problems:    Problems with blood clotting too easily:        Skin    Rashes or ulcers:        Constitutional    Fever or chills:      PHYSICAL EXAM: Vitals:   01/04/16 1206 01/04/16 1209  BP: (!) 160/87 (!) 167/84  Pulse: 78 82  Resp: 16   Temp: 97 F (36.1 C)   TempSrc: Oral   SpO2: 98%   Weight: 214 lb (97.1 kg)   Height: 5\' 10"  (1.778 m)     GENERAL: The patient is a well-nourished male, in no acute distress. The vital signs are documented above. CARDIAC: There is a regular rate and rhythm.  VASCULAR: I do not detect carotid bruits. On the right side, he has a palpable femoral, popliteal, and dorsalis pedis pulse. On the left side, he has a palpable femoral pulse. I cannot palpate pedal pulses. Both feet are warm and well-perfused. He has no significant lower extremity swelling. PULMONARY: There is good air exchange bilaterally without wheezing or rales. ABDOMEN: Soft and non-tender with normal pitched bowel sounds.  MUSCULOSKELETAL: There are no major deformities or cyanosis. NEUROLOGIC: No focal weakness or paresthesias are detected. SKIN: There are no ulcers or rashes noted. PSYCHIATRIC: The patient has a normal affect.  DATA:   DUPLEX ABDOMINAL AORTA: I have independently interpreted the duplex of his abdominal aorta which shows that the maximum diameter of his aneurysms 4.7 cm. [place] there was no significant change in the size of his aneurysm.  LOWER EXTREMITY ARTERIAL DOPPLER STUDY: Have  independently interpreted his lower extremity arterial Doppler study.  On the left side,  which is asymptomatic side, there are monophasic Doppler signals at dorsalis pedis and posterior tibial positions. ABIs 64%.  On the right side, there are triphasic Doppler signals in the dorsalis pedis and posterior tibial positions. The ABI on the right is 97%.  MEDICAL ISSUES:  ABDOMINAL AORTIC ANEURYSM: He has a 4.7 cm infrarenal abdominal aortic aneurysm. He understands we would not consider elective repair unless this reached 5.5 cm in maximum diameter. I have ordered a follow up duplex can in 6 months and I'll see him back at that time.  RIGHT LOWER EXTREMITY CLAUDICATION: His right lower extremity claudication symptoms are stable. He says that Dr. Kellie Simmering had done an arteriogram in the past that showed he did not have any good options for revascularization. I have ordered follow up ABIs in 1 year. I've encouraged him to stay as active as possible. Fortunately he is not a smoker.  HYPERTENSION: The patient's initial blood pressure today was elevated. We repeated this and this was still elevated. We have encouraged the patient to follow up with their primary care physician for management of their blood pressure.    Deitra Mayo [facility] [phone]

## 2016-01-05 ENCOUNTER — Other Ambulatory Visit: Payer: Self-pay | Admitting: *Deleted

## 2016-01-05 DIAGNOSIS — I714 Abdominal aortic aneurysm, without rupture, unspecified: Secondary | ICD-10-CM

## 2016-01-05 DIAGNOSIS — I739 Peripheral vascular disease, unspecified: Secondary | ICD-10-CM

## 2016-02-14 ENCOUNTER — Encounter: Payer: Self-pay | Admitting: Family Medicine

## 2016-02-14 ENCOUNTER — Ambulatory Visit (INDEPENDENT_AMBULATORY_CARE_PROVIDER_SITE_OTHER): Payer: Medicare Other | Admitting: Family Medicine

## 2016-02-14 VITALS — BP 134/82 | Ht 70.0 in | Wt 212.0 lb

## 2016-02-14 DIAGNOSIS — Z131 Encounter for screening for diabetes mellitus: Secondary | ICD-10-CM

## 2016-02-14 DIAGNOSIS — M2550 Pain in unspecified joint: Secondary | ICD-10-CM | POA: Diagnosis not present

## 2016-02-14 DIAGNOSIS — I6523 Occlusion and stenosis of bilateral carotid arteries: Secondary | ICD-10-CM | POA: Diagnosis not present

## 2016-02-14 DIAGNOSIS — E785 Hyperlipidemia, unspecified: Secondary | ICD-10-CM

## 2016-02-14 DIAGNOSIS — Z79899 Other long term (current) drug therapy: Secondary | ICD-10-CM | POA: Diagnosis not present

## 2016-02-14 DIAGNOSIS — Z23 Encounter for immunization: Secondary | ICD-10-CM | POA: Diagnosis not present

## 2016-02-14 MED ORDER — NORTRIPTYLINE HCL 25 MG PO CAPS: ORAL_CAPSULE | ORAL | 5 refills | Status: DC

## 2016-02-14 NOTE — Progress Notes (Signed)
   Subjective:    Patient ID: [name], male    DOB: [birth year], 75 y.o.   MRN: [id number]  Hypertension  This is a chronic problem. The current episode started more than 1 year ago. The problem has been gradually improving since onset. There are no associated agents to hypertension. There are no known risk factors for coronary artery disease. Treatments tried: lisinopril. The current treatment provides moderate improvement. There are no compliance problems.    Patient is concerned about lymes disease. He had 2 tick bites recently and now is having joint pain.  Patient continues to take lipid medication regularly. No obvious side effects from it. Generally does not miss a dose. Prior blood work results are reviewed with patient. Patient continues to work on fat intake in diet  Patient compliant with insomnia medication. Generally takes most nights. No obvious morning drowsiness. Definitely helps patient sleep. Without it patient states would not get a good nights rest.  Patient uses nortriptyline for neuropathic pain would like to take higher dose      Review of Systems No headache, no major weight loss or weight gain, no chest pain no back pain abdominal pain no change in bowel habits complete ROS otherwise negative     Objective:   Physical Exam  Alert vitals stable, NAD. Blood pressure good on repeat. HEENT normal. LungsDiminished breath sounds diffusely but clear ankles no edema nonspecific rash on for head.Marland Kitchen Heart regular rate and rhythm.       Assessment & Plan:  Impression 1 neuropathic pain discussed will increase Pamelor No. 2 hypertension good control discussed maintain same #3 hyperlipidemia status uncertain discuss to maintain for now #4 Lyme disease concerns I doubt but will check blood work at patient request #5 insomnia plan flu shot. Appropriate blood work increased nortriptyline is noted maintain other meds recheck in 6 months for wellness plus chronic

## 2016-02-16 ENCOUNTER — Other Ambulatory Visit: Payer: Self-pay | Admitting: Family Medicine

## 2016-02-17 NOTE — Telephone Encounter (Signed)
Name refill all medications with 5 additional refills

## 2016-02-20 DIAGNOSIS — Z131 Encounter for screening for diabetes mellitus: Secondary | ICD-10-CM | POA: Diagnosis not present

## 2016-02-20 DIAGNOSIS — M2550 Pain in unspecified joint: Secondary | ICD-10-CM | POA: Diagnosis not present

## 2016-02-20 DIAGNOSIS — Z79899 Other long term (current) drug therapy: Secondary | ICD-10-CM | POA: Diagnosis not present

## 2016-02-20 DIAGNOSIS — E785 Hyperlipidemia, unspecified: Secondary | ICD-10-CM | POA: Diagnosis not present

## 2016-02-21 DIAGNOSIS — Z961 Presence of intraocular lens: Secondary | ICD-10-CM | POA: Diagnosis not present

## 2016-02-21 LAB — LIPID PANEL
Chol/HDL Ratio: 3.2 ratio units (ref 0.0–5.0)
Cholesterol, Total: 110 mg/dL (ref 100–199)
HDL: 34 mg/dL — ABNORMAL LOW (ref >39)
LDL Calculated: 45 mg/dL (ref 0–99)
Triglycerides: 156 mg/dL — ABNORMAL HIGH (ref 0–149)
VLDL Cholesterol Cal: 31 mg/dL (ref 5–40)

## 2016-02-21 LAB — HEPATIC FUNCTION PANEL
ALT: 29 IU/L (ref 0–44)
AST: 24 IU/L (ref 0–40)
Albumin: 4.1 g/dL (ref 3.5–4.8)
Alkaline Phosphatase: 87 IU/L (ref 39–117)
Bilirubin Total: 0.5 mg/dL (ref 0.0–1.2)
Bilirubin, Direct: 0.17 mg/dL (ref 0.00–0.40)
Total Protein: 6.6 g/dL (ref 6.0–8.5)

## 2016-02-21 LAB — B. BURGDORFI ANTIBODIES: Lyme IgG/IgM Ab: <0.91 {ISR} (ref 0.00–0.90)

## 2016-02-21 LAB — HEMOGLOBIN A1C
Est. average glucose Bld gHb Est-mCnc: 128 mg/dL
Hgb A1c MFr Bld: 6.1 % — ABNORMAL HIGH (ref 4.8–5.6)

## 2016-02-21 LAB — GLUCOSE, RANDOM: Glucose: 125 mg/dL — ABNORMAL HIGH (ref 65–99)

## 2016-02-26 ENCOUNTER — Encounter: Payer: Self-pay | Admitting: Family Medicine

## 2016-03-17 ENCOUNTER — Other Ambulatory Visit: Payer: Self-pay | Admitting: Family Medicine

## 2016-04-02 DIAGNOSIS — Z85828 Personal history of other malignant neoplasm of skin: Secondary | ICD-10-CM | POA: Diagnosis not present

## 2016-04-02 DIAGNOSIS — L309 Dermatitis, unspecified: Secondary | ICD-10-CM | POA: Diagnosis not present

## 2016-04-02 DIAGNOSIS — L570 Actinic keratosis: Secondary | ICD-10-CM | POA: Diagnosis not present

## 2016-07-10 ENCOUNTER — Encounter: Payer: Self-pay | Admitting: Vascular Surgery

## 2016-07-16 ENCOUNTER — Other Ambulatory Visit: Payer: Self-pay

## 2016-07-16 ENCOUNTER — Other Ambulatory Visit: Payer: Self-pay | Admitting: *Deleted

## 2016-07-16 DIAGNOSIS — I714 Abdominal aortic aneurysm, without rupture, unspecified: Secondary | ICD-10-CM

## 2016-07-18 ENCOUNTER — Encounter: Payer: Self-pay | Admitting: Vascular Surgery

## 2016-07-18 ENCOUNTER — Ambulatory Visit (HOSPITAL_COMMUNITY)
Admission: RE | Admit: 2016-07-18 | Discharge: 2016-07-18 | Disposition: A | Discharge status: Home / Self Care | Payer: Medicare Other | Source: Ambulatory Visit | Attending: Vascular Surgery | Admitting: Vascular Surgery

## 2016-07-18 ENCOUNTER — Ambulatory Visit (INDEPENDENT_AMBULATORY_CARE_PROVIDER_SITE_OTHER): Payer: Medicare Other | Admitting: Vascular Surgery

## 2016-07-18 VITALS — BP 155/97 | HR 77 | Temp 97.0°F | Resp 16 | Ht 70.0 in | Wt 213.0 lb

## 2016-07-18 DIAGNOSIS — I714 Abdominal aortic aneurysm, without rupture, unspecified: Secondary | ICD-10-CM

## 2016-07-18 NOTE — Progress Notes (Signed)
Patient name: [name] MRN: [id number] DOB: 1940/08/19 Sex: male  REASON FOR VISIT: Follow up of abdominal aortic aneurysm  HPI: [name] is a 76 y.o. male who I have been following with an abdominal aortic aneurysm. I last saw him on 01/04/2016. At that time, by duplex the maximum diameter of the aneurysm was 4.7 cm. This had not changed in size. He also had arterial Doppler studies which showed an ABI of 64% on the left side and 97% on the right side.  Since I saw him last, he denies any history of abdominal pain or back pain. He does continue to have stable left calf claudication. His symptoms are brought on by ambulation and relieved with rest. He denies any history of rest pain or nonhealing ulcers.  He is not a smoker. He quit 13 years ago.  Past Medical History:  Diagnosis Date  . Abdominal aortic aneurysm (HCC)    3.5 cm in 2011  . Adenomatous polyps   . Arteriosclerotic cardiovascular disease (ASCVD) 2006   CABG in 2006; normal EF  . CAD (coronary artery disease)   . Carotid stenosis   . 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 Degenerative joint disease of spine 2011   Lumbosacral and cervical; ant. discectomy, fusion and plate in 2440  . DIABETES MELLITUS, TYPE II 01/31/2010  . Diverticulosis   . ED (erectile dysfunction)   . Hyperlipidemia   . Hypertension    LVH  . Insomnia   . Neuropathy ([place])   . Peripheral vascular disease ([place])    Left femoral art. stent; bilateral renal art. stents-2007; ABIs-nl right; 0.80 left in 5/06; angio in 2007-95% bilat. renal; diffuse aortoiliac ASCVD +ulceration;     . Sinus headache   . Stroke ([place])   . Tobacco abuse, in remission    [id number]    Family History  Problem Relation Age of Onset  . Stroke Father   . Pulmonary embolism Mother     Following hip fracture  . Colon cancer Brother   . Hypertension Brother     SOCIAL  HISTORY: Social History  Substance Use Topics  . Smoking status: Former Smoker    Packs/day: 1.00    Years: 50.00    Types: Cigarettes    Start date: 05/10/1952    Quit date: 10/18/2003  . Smokeless tobacco: Never Used     Comment: 50 pack years; quit in 2005   . Alcohol use No    Allergies  Allergen Reactions  . Lipitor [Atorvastatin] Other (See Comments)    Side effects-muscle aches    Current Outpatient Prescriptions  Medication Sig Dispense Refill  . ALPRAZolam (XANAX) 1 MG tablet take 1 tablet by mouth at bedtime if needed 30 tablet 5  . clopidogrel (PLAVIX) 75 MG tablet take 1 tablet by mouth once daily 30 tablet 5  . lisinopril (PRINIVIL,ZESTRIL) 20 MG tablet take 1 tablet by mouth twice a day 60 tablet 5  . Multiple Vitamin (MULTIVITAMIN) tablet Take 1 tablet by mouth daily.      Marland Kitchen neomycin-polymyxin-hydrocortisone (CORTISPORIN) otic solution Place 3 drops into both ears 4 (four) times daily. 10 mL 0  . nortriptyline (PAMELOR) 25 MG capsule Take 2 tablets daily at bedtime 60 capsule 5  . nortriptyline (PAMELOR) 25 MG capsule take 1 capsule by mouth at bedtime 30 capsule 5  . Omega-3 Fatty Acids (FISH OIL)  1000 MG CAPS Take 1 capsule by mouth daily.      . simvastatin (ZOCOR) 80 MG tablet take 1 tablet by mouth once daily 30 tablet 5  . temazepam (RESTORIL) 30 MG capsule take 1 capsule by mouth at bedtime 30 capsule 5   No current facility-administered medications for this visit.     REVIEW OF SYSTEMS:  [X]  denotes positive finding, [ ]  denotes negative finding Cardiac  Comments:  Chest pain or chest pressure:    Shortness of breath upon exertion:    Short of breath when lying flat:    Irregular heart rhythm:        Vascular    Pain in calf, thigh, or hip brought on by ambulation:    Pain in feet at night that wakes you up from your sleep:     Blood clot in your veins:    Leg swelling:         Pulmonary    Oxygen at home:    Productive cough:     Wheezing:          Neurologic    Sudden weakness in arms or legs:     Sudden numbness in arms or legs:     Sudden onset of difficulty speaking or slurred speech:    Temporary loss of vision in one eye:     Problems with dizziness:         Gastrointestinal    Blood in stool:     Vomited blood:         Genitourinary    Burning when urinating:     Blood in urine:        Psychiatric    Major depression:         Hematologic    Bleeding problems:    Problems with blood clotting too easily:        Skin    Rashes or ulcers:        Constitutional    Fever or chills:      PHYSICAL EXAM: Vitals:   07/18/16 0942  BP: (!) 155/97  Pulse: 77  Resp: 16  Temp: 97 F (36.1 C)  TempSrc: Oral  SpO2: 91%  Weight: 213 lb (96.6 kg)  Height: 5\' 10"  (1.778 m)    GENERAL: The patient is a well-nourished male, in no acute distress. The vital signs are documented above. CARDIAC: There is a regular rate and rhythm.  VASCULAR: I do not detect carotid bruits. He has palpable femoral pulses. I cannot palpate posterior tibial pulses however both feet are warm and well-perfused. He has no significant lower extremity swelling. PULMONARY: There is good air exchange bilaterally without wheezing or rales. ABDOMEN: Soft and non-tender with normal pitched bowel sounds.  MUSCULOSKELETAL: There are no major deformities or cyanosis. NEUROLOGIC: No focal weakness or paresthesias are detected. SKIN: There are no ulcers or rashes noted. PSYCHIATRIC: The patient has a normal affect.  DATA:   DUPLEX OF ABDOMINAL AORTA: Duplex of the abdominal aorta shows that the maximum diameter of the distal aorta is 4.6 cm which has not changed in size in 6 months.  MEDICAL ISSUES:  ABDOMINAL AORTIC ANEURYSM: His aneurysm is stable in size at 4.6 cm. He understands we would not consider elective repair of an aneurysm unless it reached 5.5 cm in maximum diameter. I ordered a follow up duplex scan in 9 months and I'll see him back  at that time. I did explain his blood pressure was slightly  elevated today and will continue to follow closely with his primary care physician. Fortunately, he is not a smoker.    Deitra Mayo [facility] [phone]

## 2016-08-10 ENCOUNTER — Other Ambulatory Visit: Payer: Self-pay | Admitting: Family Medicine

## 2016-08-11 ENCOUNTER — Other Ambulatory Visit: Payer: Self-pay | Admitting: Family Medicine

## 2016-08-13 ENCOUNTER — Other Ambulatory Visit: Payer: Self-pay | Admitting: Family Medicine

## 2016-08-13 NOTE — Telephone Encounter (Signed)
Ok six mo worth if time

## 2016-08-13 NOTE — Telephone Encounter (Signed)
Last 07/18/16

## 2016-08-14 NOTE — Telephone Encounter (Signed)
Six mo ok 

## 2016-08-16 ENCOUNTER — Ambulatory Visit (INDEPENDENT_AMBULATORY_CARE_PROVIDER_SITE_OTHER): Payer: Medicare Other | Admitting: Family Medicine

## 2016-08-16 ENCOUNTER — Encounter: Payer: Self-pay | Admitting: Family Medicine

## 2016-08-16 VITALS — BP 148/90 | Ht 70.0 in | Wt 210.0 lb

## 2016-08-16 DIAGNOSIS — E785 Hyperlipidemia, unspecified: Secondary | ICD-10-CM | POA: Diagnosis not present

## 2016-08-16 DIAGNOSIS — I10 Essential (primary) hypertension: Secondary | ICD-10-CM

## 2016-08-16 DIAGNOSIS — Z0000 Encounter for general adult medical examination without abnormal findings: Secondary | ICD-10-CM | POA: Diagnosis not present

## 2016-08-16 MED ORDER — SIMVASTATIN 80 MG PO TABS: 80.0000 mg | ORAL_TABLET | Freq: Every day | ORAL | 1 refills | Status: DC

## 2016-08-16 MED ORDER — HYDROCHLOROTHIAZIDE 25 MG PO TABS: 25.0000 mg | ORAL_TABLET | Freq: Every day | ORAL | 1 refills | Status: DC

## 2016-08-16 MED ORDER — LISINOPRIL 20 MG PO TABS: 20.0000 mg | ORAL_TABLET | Freq: Two times a day (BID) | ORAL | 1 refills | Status: DC

## 2016-08-16 MED ORDER — CLOPIDOGREL BISULFATE 75 MG PO TABS: 75.0000 mg | ORAL_TABLET | Freq: Every day | ORAL | 1 refills | Status: DC

## 2016-08-16 NOTE — Progress Notes (Signed)
Subjective:    Patient ID: [name], male    DOB: [birth year], 76 y.o.   MRN: [id number]  HPI  AWV- Annual Wellness Visit  The patient was seen for their annual wellness visit. The patient's past medical history, surgical history, and family history were reviewed. Pertinent vaccines were reviewed ( tetanus, pneumonia, shingles, flu) The patient's medication list was reviewed and updated.  The height and weight were entered. The patient's current BMI is:  Cognitive screening was completed. Outcome of Mini - Cog: Pass  Falls within the past 6 months:Patient states no falls.  Current tobacco usage: None  (All patients who use tobacco were given written and verbal information on quitting)  Recent listing of emergency department/hospitalizations over the past year were reviewed.  current specialist the patient sees on a regular basis: Patient states see's  Dr.Dickerson a vein specialist, and Cardiologist.    Medicare annual wellness visit patient questionnaire was reviewed.  A written screening schedule for the patient for the next 5-10 years was given. Appropriate discussion of followup regarding next visit was discussed.  Blood pressure medicine and blood pressure levels reviewed today with patient. Compliant with blood pressure medicine. States does not miss a dose. No obvious side effects. Blood pressure generally good when checked elsewhere. Watching salt intake.  Patient continues to take lipid medication regularly. No obvious side effects from it. Generally does not miss a dose. Prior blood work results are reviewed with patient. Patient continues to work on fat intake in diet Not exercising much, needs to get back at it    Review of chart reveals elevated blood pressure when checked elsewhere    Review of Systems  Constitutional: Negative.  Negative for activity change, appetite change and fever.  HENT: Negative for congestion and rhinorrhea.   Eyes: Negative  for discharge.  Respiratory: Negative for cough and wheezing.   Cardiovascular: Negative for chest pain.  Gastrointestinal: Negative for abdominal pain, blood in stool and vomiting.  Genitourinary: Negative for difficulty urinating and frequency.  Musculoskeletal: Negative for neck pain.  Skin: Negative for rash.  Allergic/Immunologic: Negative for environmental allergies and food allergies.  Neurological: Negative for weakness and headaches.  Psychiatric/Behavioral: Negative for agitation.  All other systems reviewed and are negative.      Objective:   Physical Exam  Constitutional: He appears well-developed and well-nourished.  Blood pressure 148/90 both arms on repeat  HENT:  Head: Normocephalic and atraumatic.  Right Ear: External ear normal.  Left Ear: External ear normal.  Nose: Nose normal.  Mouth/Throat: Oropharynx is clear and moist.  Eyes: EOM are normal. Pupils are equal, round, and reactive to light.  Neck: Normal range of motion. Neck supple. No thyromegaly present.  Cardiovascular: Normal rate, regular rhythm and normal heart sounds.   No murmur heard. Pulmonary/Chest: Effort normal and breath sounds normal. No respiratory distress. He has no wheezes.  Cardiac scar noted anterior chest  Abdominal: Soft. Bowel sounds are normal. He exhibits no distension and no mass. There is no tenderness.  Genitourinary: Penis normal.  Genitourinary Comments: Prostate gland within normal limits  Musculoskeletal: Normal range of motion. He exhibits no edema.  Ankles trace edema  Lymphadenopathy:    He has no cervical adenopathy.  Neurological: He is alert. He exhibits normal muscle tone.  Skin: Skin is warm and dry. No erythema.  Psychiatric: He has a normal mood and affect. His behavior is normal. Judgment normal.  Vitals reviewed.  Assessment & Plan:  Impression 1 wellness exam diet exercise discussed patient needs colonoscopy. Up-to-date on pneumonia shots gets  yearly flu shot #2 hypertension suboptimal concerning particularly with abdominal aortic aneurysm #3 hyperlipidemia status uncertain plan appropriate blood work. Diet exercise discussed. Add hydrochlorothiazide 25 every morning. GI referral

## 2016-08-24 DIAGNOSIS — E785 Hyperlipidemia, unspecified: Secondary | ICD-10-CM | POA: Diagnosis not present

## 2016-08-24 DIAGNOSIS — I10 Essential (primary) hypertension: Secondary | ICD-10-CM | POA: Diagnosis not present

## 2016-08-25 LAB — BASIC METABOLIC PANEL
BUN/Creatinine Ratio: 18 (ref 10–24)
BUN: 26 mg/dL (ref 8–27)
CO2: 26 mmol/L (ref 18–29)
Calcium: 9.9 mg/dL (ref 8.6–10.2)
Chloride: 100 mmol/L (ref 96–106)
Creatinine, Ser: 1.41 mg/dL — ABNORMAL HIGH (ref 0.76–1.27)
GFR calc Af Amer: 55 mL/min/{1.73_m2} — ABNORMAL LOW (ref >59)
GFR calc non Af Amer: 48 mL/min/{1.73_m2} — ABNORMAL LOW (ref >59)
Glucose: 137 mg/dL — ABNORMAL HIGH (ref 65–99)
Potassium: 5.4 mmol/L — ABNORMAL HIGH (ref 3.5–5.2)
Sodium: 141 mmol/L (ref 134–144)

## 2016-08-25 LAB — HEPATIC FUNCTION PANEL
ALT: 24 IU/L (ref 0–44)
AST: 25 IU/L (ref 0–40)
Albumin: 4.1 g/dL (ref 3.5–4.8)
Alkaline Phosphatase: 82 IU/L (ref 39–117)
Bilirubin Total: 0.8 mg/dL (ref 0.0–1.2)
Bilirubin, Direct: 0.2 mg/dL (ref 0.00–0.40)
Total Protein: 6.8 g/dL (ref 6.0–8.5)

## 2016-08-25 LAB — LIPID PANEL
Chol/HDL Ratio: 3 ratio (ref 0.0–5.0)
Cholesterol, Total: 101 mg/dL (ref 100–199)
HDL: 34 mg/dL — ABNORMAL LOW (ref >39)
LDL Calculated: 36 mg/dL (ref 0–99)
Triglycerides: 155 mg/dL — ABNORMAL HIGH (ref 0–149)
VLDL Cholesterol Cal: 31 mg/dL (ref 5–40)

## 2016-08-26 ENCOUNTER — Encounter: Payer: Self-pay | Admitting: Family Medicine

## 2016-08-28 ENCOUNTER — Encounter: Payer: Self-pay | Admitting: Family Medicine

## 2016-10-01 DIAGNOSIS — L570 Actinic keratosis: Secondary | ICD-10-CM | POA: Diagnosis not present

## 2017-01-09 ENCOUNTER — Encounter: Payer: Self-pay | Admitting: Vascular Surgery

## 2017-01-09 ENCOUNTER — Ambulatory Visit (HOSPITAL_COMMUNITY)
Admission: RE | Admit: 2017-01-09 | Discharge: 2017-01-09 | Disposition: A | Discharge status: Home / Self Care | Payer: Medicare Other | Source: Ambulatory Visit | Attending: Vascular Surgery | Admitting: Vascular Surgery

## 2017-01-09 ENCOUNTER — Ambulatory Visit: Payer: Medicare Other | Admitting: Vascular Surgery

## 2017-01-09 ENCOUNTER — Ambulatory Visit (INDEPENDENT_AMBULATORY_CARE_PROVIDER_SITE_OTHER)
Admission: RE | Admit: 2017-01-09 | Discharge: 2017-01-09 | Disposition: A | Discharge status: Home / Self Care | Payer: Medicare Other | Source: Ambulatory Visit | Attending: Vascular Surgery | Admitting: Vascular Surgery

## 2017-01-09 DIAGNOSIS — I714 Abdominal aortic aneurysm, without rupture, unspecified: Secondary | ICD-10-CM

## 2017-01-09 DIAGNOSIS — I10 Essential (primary) hypertension: Secondary | ICD-10-CM | POA: Diagnosis not present

## 2017-01-09 DIAGNOSIS — Z87891 Personal history of nicotine dependence: Secondary | ICD-10-CM | POA: Diagnosis not present

## 2017-01-09 DIAGNOSIS — I739 Peripheral vascular disease, unspecified: Secondary | ICD-10-CM

## 2017-01-09 DIAGNOSIS — E785 Hyperlipidemia, unspecified: Secondary | ICD-10-CM | POA: Diagnosis not present

## 2017-01-16 ENCOUNTER — Encounter: Payer: Self-pay | Admitting: Vascular Surgery

## 2017-01-16 ENCOUNTER — Ambulatory Visit (INDEPENDENT_AMBULATORY_CARE_PROVIDER_SITE_OTHER): Payer: Medicare Other | Admitting: Vascular Surgery

## 2017-01-16 VITALS — BP 106/71 | HR 75 | Temp 97.0°F | Ht 70.0 in | Wt 207.6 lb

## 2017-01-16 DIAGNOSIS — I739 Peripheral vascular disease, unspecified: Secondary | ICD-10-CM

## 2017-01-16 DIAGNOSIS — I714 Abdominal aortic aneurysm, without rupture, unspecified: Secondary | ICD-10-CM

## 2017-01-16 NOTE — Progress Notes (Signed)
Patient name: [name] MRN: [id number] DOB: 1941/05/06 Sex: male  REASON FOR VISIT:    Follow up of abdominal aortic aneurysm and peripheral vascular disease.  HPI:   [name] is a pleasant 76 y.o. male who I last saw on 07/18/2016. At that time, by duplex, the maximum diameter of his infrarenal aorta was 4.6 cm which had not changed in 6 months. He does have chronic back pain but denies any abdominal pain.  In addition, he continues to have left lower extremity claudication. His symptoms are brought on by ambulation and relieved with rest. There are no other aggravating or alleviating factors. His symptoms have gradually progressed. He used to be able to walk 200 yards. Gradually over the last year he can now walk less than 100 yards. He also experiences some numbness in both feet with ambulation but more so on the left. He has had previous back surgery. He denies any history of rest pain or history of nonhealing ulcers.  He quit tobacco in 2006. He is on a statin. He does take Plavix.  Past Medical History:  Diagnosis Date  . Abdominal aortic aneurysm (HCC)    3.5 cm in 2011  . Adenomatous polyps   . Arteriosclerotic cardiovascular disease (ASCVD) 2006   CABG in 2006; normal EF  . CAD (coronary artery disease)   . Carotid stenosis   . 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 Degenerative joint disease of spine 2011   Lumbosacral and cervical; ant. discectomy, fusion and plate in 5102  . DIABETES MELLITUS, TYPE II 01/31/2010  . Diverticulosis   . ED (erectile dysfunction)   . Hyperlipidemia   . Hypertension    LVH  . Insomnia   . Neuropathy   . Peripheral vascular disease ([place])    Left femoral art. stent; bilateral renal art. stents-2007; ABIs-nl right; 0.80 left in 5/06; angio in 2007-95% bilat. renal; diffuse aortoiliac ASCVD +ulceration;     . Sinus headache   . Stroke ([place])   .  Tobacco abuse, in remission    [id number]    Family History  Problem Relation Age of Onset  . Stroke Father   . Pulmonary embolism Mother        Following hip fracture  . Colon cancer Brother   . Hypertension Brother     SOCIAL HISTORY: Social History  Substance Use Topics  . Smoking status: Former Smoker    Packs/day: 1.00    Years: 50.00    Types: Cigarettes    Start date: 05/10/1952    Quit date: 10/18/2003  . Smokeless tobacco: Never Used     Comment: 50 pack years; quit in 2005   . Alcohol use No    Allergies  Allergen Reactions  . Lipitor [Atorvastatin] Other (See Comments)    Side effects-muscle aches    Current Outpatient Prescriptions  Medication Sig Dispense Refill  . ALPRAZolam (XANAX) 1 MG tablet take 1 tablet by mouth at bedtime if needed 30 tablet 5  . clopidogrel (PLAVIX) 75 MG tablet Take 1 tablet (75 mg total) by mouth daily. 90 tablet 1  . hydrochlorothiazide (HYDRODIURIL) 25 MG tablet Take 1 tablet (25 mg total) by mouth daily. 90 tablet 1  . lisinopril (PRINIVIL,ZESTRIL) 20 MG tablet Take 1 tablet (20 mg total) by mouth 2 (two) times daily. 180 tablet 1  . Multiple Vitamin (MULTIVITAMIN) tablet Take 1  tablet by mouth daily.      Marland Kitchen neomycin-polymyxin-hydrocortisone (CORTISPORIN) otic solution Place 3 drops into both ears 4 (four) times daily. 10 mL 0  . nortriptyline (PAMELOR) 25 MG capsule take 1 capsule by mouth at bedtime 30 capsule 5  . Omega-3 Fatty Acids (FISH OIL) 1000 MG CAPS Take 1 capsule by mouth daily.      . simvastatin (ZOCOR) 80 MG tablet Take 1 tablet (80 mg total) by mouth daily. 90 tablet 1  . temazepam (RESTORIL) 30 MG capsule take 1 capsule by mouth at bedtime 30 capsule 5   No current facility-administered medications for this visit.     REVIEW OF SYSTEMS:  [X]  denotes positive finding, [ ]  denotes negative finding Cardiac  Comments:  Chest pain or chest pressure:    Shortness of breath upon exertion:    Short of breath when  lying flat:    Irregular heart rhythm:        Vascular    Pain in calf, thigh, or hip brought on by ambulation: X   Pain in feet at night that wakes you up from your sleep:     Blood clot in your veins:    Leg swelling:         Pulmonary    Oxygen at home:    Productive cough:     Wheezing:         Neurologic    Sudden weakness in arms or legs:     Sudden numbness in arms or legs:     Sudden onset of difficulty speaking or slurred speech:    Temporary loss of vision in one eye:     Problems with dizziness:         Gastrointestinal    Blood in stool:     Vomited blood:         Genitourinary    Burning when urinating:     Blood in urine:        Psychiatric    Major depression:         Hematologic    Bleeding problems:    Problems with blood clotting too easily:        Skin    Rashes or ulcers:        Constitutional    Fever or chills:     PHYSICAL EXAM:   Vitals:   01/16/17 1205  BP: 106/71  Pulse: 75  Temp: (!) 97 F (36.1 C)  TempSrc: Oral  SpO2: 95%  Weight: 207 lb 9.6 oz (94.2 kg)  Height: 5\' 10"  (1.778 m)    GENERAL: The patient is a well-nourished male, in no acute distress. The vital signs are documented above. CARDIAC: There is a regular rate and rhythm.  VASCULAR: I do not detect carotid bruits. He has palpable femoral pulses. I cannot palpate popliteal or pedal pulses. He has no significant lower extremity swelling. PULMONARY: There is good air exchange bilaterally without wheezing or rales. ABDOMEN: Soft and non-tender with normal pitched bowel sounds. His aneurysm is palpable and nontender. MUSCULOSKELETAL: There are no major deformities or cyanosis. NEUROLOGIC: No focal weakness or paresthesias are detected. SKIN: There are no ulcers or rashes noted. PSYCHIATRIC: The patient has a normal affect.  DATA:    DUPLEX ABDOMINAL AORTA: I have independently interpreted the duplex of his abdominal aorta that was done on 12/31/1916. The maximum  diameter of his aneurysm is 4.6 cm. The right common iliac artery measures 1.5 cm in maximal diameter.  The left common iliac artery measures 1.7 cm in maximum diameter.  ARTERIAL DOPPLER STUDY: I have independently interpreted his arterial Doppler study that was done on 01/09/2017.  On the right side he has a biphasic dorsalis pedis and posterior tibial signal with an ABI of 99% and a toe pressure of 92 mmHg.  On the left side he has a monophasic dorsalis pedis and posterior tibial signal. ABI on the left is 56%. Toe pressure on the left is 53 mmHg.  MEDICAL ISSUES:   ABDOMINAL AORTIC ANEURYSM: His abdominal aortic aneurysm is stable in size at 4.6 cm. He understands we would not consider elective repair less it reached 5.5 cm in maximum diameter. I ordered a follow up duplex scan in 6 months and I'll see him back at that time. Fortunately he is not a smoker.  PERIPHERAL VASCULAR DISEASE: This patient has progressive claudication of the left calf. However he this point he does not have rest pain or any history of nonhealing ulcers. If his symptoms progress we could consider arteriography and we have discussed this in the office today. I've encouraged him to stay as active as possible. We've also discussed the importance of nutrition. I will not or follow up ABIs in 6 months but we'll recheck those one year from now. Certainly if his symptoms progress we can arrange to do that sooner or also consider arteriography. He knows to call sooner if he has problems.  Deitra Mayo [facility] [phone]

## 2017-01-17 NOTE — Addendum Note (Signed)
Addended by: Lianne Cure A on: 01/17/2017 10:27 AM   Modules accepted: Orders

## 2017-02-03 ENCOUNTER — Other Ambulatory Visit: Payer: Self-pay | Admitting: Family Medicine

## 2017-02-09 ENCOUNTER — Other Ambulatory Visit: Payer: Self-pay | Admitting: Family Medicine

## 2017-02-11 ENCOUNTER — Ambulatory Visit (INDEPENDENT_AMBULATORY_CARE_PROVIDER_SITE_OTHER): Payer: Medicare Other | Admitting: Family Medicine

## 2017-02-11 ENCOUNTER — Encounter: Payer: Self-pay | Admitting: Family Medicine

## 2017-02-11 VITALS — BP 132/72 | Ht 70.0 in | Wt 207.0 lb

## 2017-02-11 DIAGNOSIS — I714 Abdominal aortic aneurysm, without rupture, unspecified: Secondary | ICD-10-CM

## 2017-02-11 DIAGNOSIS — I10 Essential (primary) hypertension: Secondary | ICD-10-CM | POA: Diagnosis not present

## 2017-02-11 DIAGNOSIS — E785 Hyperlipidemia, unspecified: Secondary | ICD-10-CM

## 2017-02-11 DIAGNOSIS — N183 Chronic kidney disease, stage 3 unspecified: Secondary | ICD-10-CM | POA: Insufficient documentation

## 2017-02-11 DIAGNOSIS — Z23 Encounter for immunization: Secondary | ICD-10-CM

## 2017-02-11 DIAGNOSIS — Z79899 Other long term (current) drug therapy: Secondary | ICD-10-CM | POA: Diagnosis not present

## 2017-02-11 MED ORDER — LISINOPRIL 20 MG PO TABS: 20.0000 mg | ORAL_TABLET | Freq: Two times a day (BID) | ORAL | 1 refills | Status: DC

## 2017-02-11 MED ORDER — NORTRIPTYLINE HCL 25 MG PO CAPS: 25.0000 mg | ORAL_CAPSULE | Freq: Every day | ORAL | 5 refills | Status: DC

## 2017-02-11 MED ORDER — CLOPIDOGREL BISULFATE 75 MG PO TABS: 75.0000 mg | ORAL_TABLET | Freq: Every day | ORAL | 1 refills | Status: DC

## 2017-02-11 MED ORDER — SIMVASTATIN 80 MG PO TABS: 80.0000 mg | ORAL_TABLET | Freq: Every day | ORAL | 1 refills | Status: DC

## 2017-02-11 MED ORDER — TEMAZEPAM 30 MG PO CAPS: 30.0000 mg | ORAL_CAPSULE | Freq: Every day | ORAL | 5 refills | Status: DC

## 2017-02-11 NOTE — Progress Notes (Signed)
   Subjective:    Patient ID: [name], male    DOB: 1941-02-13, 76 y.o.   MRN: [id number]  Hypertension  This is a recurrent problem. The problem is controlled.   He is on Lisinopril 20 mg two daily.He says he eats healthy, and gets plenty of exercise three times per week.  Patient compliant with insomnia medication. Generally takes most nights. No obvious morning drowsiness. Definitely helps patient sleep. Without it patient states would not get a good nights rest.  Blood pressure medicine and blood pressure levels reviewed today with patient. Compliant with blood pressure medicine. States does not miss a dose. No obvious side effects. Blood pressure generally good when checked elsewhere. Watching salt intake.   Patient continues to take lipid medication regularly. No obvious side effects from it. Generally does not miss a dose. Prior blood work results are reviewed with patient. Patient continues to work on fat intake in diet   Aorta stael with [facility] surgeon ck up   Back aching up with exercise, not doing as well, has had more bk pain lately  Ties ot to miss a dose on  meds   Flu shot today   numbrs decent    Review of Systems No headache, no major weight loss or weight gain, no chest pain no back pain abdominal pain no change in bowel habits complete ROS otherwise negative     Objective:   Physical Exam  Alert and oriented, vitals reviewed and stable, NAD ENT-TM's and ext canals WNL bilat via otoscopic exam Soft palate, tonsils and post pharynx WNL via oropharyngeal exam Neck-symmetric, no masses; thyroid nonpalpable and nontender Pulmonary-no tachypnea or accessory muscle use; Clear without wheezes via auscultation Card--no abnrml murmurs, rhythm reg and rate WNL Carotid pulses symmetric, without bruits       Assessment & Plan:  Impression 1 hypertension good control discussed maintain same meds compliance discussed  #2 hyperlipidemia. Current status  uncertain. Prior blood work reviewed. To maintain same pending blood work.  #3 insomnia ongoing with definitely for meds discussed along with side effects benefits  #4 renal insufficiency. 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 Discussed at length. The ramifications.  Flu shot today./Diet exercise discussed. Appropriate blood work. Further recommendations based results

## 2017-02-12 DIAGNOSIS — E785 Hyperlipidemia, unspecified: Secondary | ICD-10-CM | POA: Diagnosis not present

## 2017-02-12 DIAGNOSIS — Z79899 Other long term (current) drug therapy: Secondary | ICD-10-CM | POA: Diagnosis not present

## 2017-02-13 LAB — BASIC METABOLIC PANEL
BUN/Creatinine Ratio: 15 (ref 10–24)
BUN: 17 mg/dL (ref 8–27)
CO2: 26 mmol/L (ref 20–29)
Calcium: 9.5 mg/dL (ref 8.6–10.2)
Chloride: 101 mmol/L (ref 96–106)
Creatinine, Ser: 1.17 mg/dL (ref 0.76–1.27)
GFR calc Af Amer: 69 mL/min/{1.73_m2} (ref >59)
GFR calc non Af Amer: 60 mL/min/{1.73_m2} (ref >59)
Glucose: 138 mg/dL — ABNORMAL HIGH (ref 65–99)
Potassium: 4.8 mmol/L (ref 3.5–5.2)
Sodium: 140 mmol/L (ref 134–144)

## 2017-02-13 LAB — HEPATIC FUNCTION PANEL
ALT: 26 IU/L (ref 0–44)
AST: 21 IU/L (ref 0–40)
Albumin: 4.2 g/dL (ref 3.5–4.8)
Alkaline Phosphatase: 80 IU/L (ref 39–117)
Bilirubin Total: 0.4 mg/dL (ref 0.0–1.2)
Bilirubin, Direct: 0.12 mg/dL (ref 0.00–0.40)
Total Protein: 6.5 g/dL (ref 6.0–8.5)

## 2017-02-13 LAB — LIPID PANEL
Chol/HDL Ratio: 4.3 ratio (ref 0.0–5.0)
Cholesterol, Total: 121 mg/dL (ref 100–199)
HDL: 28 mg/dL — ABNORMAL LOW (ref >39)
LDL Calculated: 18 mg/dL (ref 0–99)
Triglycerides: 377 mg/dL — ABNORMAL HIGH (ref 0–149)
VLDL Cholesterol Cal: 75 mg/dL — ABNORMAL HIGH (ref 5–40)

## 2017-02-18 ENCOUNTER — Telehealth: Payer: Self-pay

## 2017-02-18 ENCOUNTER — Other Ambulatory Visit: Payer: Self-pay

## 2017-02-18 MED ORDER — ALPRAZOLAM 1 MG PO TABS: ORAL_TABLET | ORAL | 4 refills | Status: DC

## 2017-02-18 NOTE — Telephone Encounter (Signed)
#  30 with 4 refills-caution drowsiness

## 2017-02-18 NOTE — Telephone Encounter (Signed)
Awaiting a signature.

## 2017-02-18 NOTE — Telephone Encounter (Signed)
Prescription sent to [facility].

## 2017-02-18 NOTE — Telephone Encounter (Signed)
[facility] Requesting refills on Alprazolam 1 mg one po QHS prn. Seen last on Feb 11, 2017 for Htn.Please advise.Thanks,CS

## 2017-02-19 ENCOUNTER — Encounter: Payer: Self-pay | Admitting: Family Medicine

## 2017-02-26 DIAGNOSIS — Z961 Presence of intraocular lens: Secondary | ICD-10-CM | POA: Diagnosis not present

## 2017-03-18 ENCOUNTER — Other Ambulatory Visit: Payer: Self-pay | Admitting: Family Medicine

## 2017-04-09 DIAGNOSIS — L570 Actinic keratosis: Secondary | ICD-10-CM | POA: Diagnosis not present

## 2017-04-09 DIAGNOSIS — Z85828 Personal history of other malignant neoplasm of skin: Secondary | ICD-10-CM | POA: Diagnosis not present

## 2017-04-09 DIAGNOSIS — L821 Other seborrheic keratosis: Secondary | ICD-10-CM | POA: Diagnosis not present

## 2017-06-18 ENCOUNTER — Encounter (HOSPITAL_COMMUNITY): Payer: Self-pay | Admitting: Emergency Medicine

## 2017-06-18 ENCOUNTER — Emergency Department (HOSPITAL_COMMUNITY): Payer: Medicare Other

## 2017-06-18 ENCOUNTER — Inpatient Hospital Stay (HOSPITAL_COMMUNITY)
Admission: EM | Admit: 2017-06-18 | Discharge: 2017-06-21 | DRG: 638 | Disposition: A | Discharge status: Home / Self Care | Payer: Medicare Other | Attending: Internal Medicine | Admitting: Internal Medicine

## 2017-06-18 ENCOUNTER — Other Ambulatory Visit: Payer: Self-pay

## 2017-06-18 DIAGNOSIS — E1110 Type 2 diabetes mellitus with ketoacidosis without coma: Secondary | ICD-10-CM | POA: Diagnosis not present

## 2017-06-18 DIAGNOSIS — I2510 Atherosclerotic heart disease of native coronary artery without angina pectoris: Secondary | ICD-10-CM | POA: Diagnosis not present

## 2017-06-18 DIAGNOSIS — R55 Syncope and collapse: Secondary | ICD-10-CM | POA: Diagnosis not present

## 2017-06-18 DIAGNOSIS — G4700 Insomnia, unspecified: Secondary | ICD-10-CM | POA: Diagnosis present

## 2017-06-18 DIAGNOSIS — I714 Abdominal aortic aneurysm, without rupture, unspecified: Secondary | ICD-10-CM | POA: Diagnosis present

## 2017-06-18 DIAGNOSIS — N183 Chronic kidney disease, stage 3 (moderate): Secondary | ICD-10-CM | POA: Diagnosis not present

## 2017-06-18 DIAGNOSIS — Z981 Arthrodesis status: Secondary | ICD-10-CM

## 2017-06-18 DIAGNOSIS — I739 Peripheral vascular disease, unspecified: Secondary | ICD-10-CM | POA: Diagnosis present

## 2017-06-18 DIAGNOSIS — E860 Dehydration: Secondary | ICD-10-CM | POA: Diagnosis not present

## 2017-06-18 DIAGNOSIS — I129 Hypertensive chronic kidney disease with stage 1 through stage 4 chronic kidney disease, or unspecified chronic kidney disease: Secondary | ICD-10-CM | POA: Diagnosis present

## 2017-06-18 DIAGNOSIS — E1142 Type 2 diabetes mellitus with diabetic polyneuropathy: Secondary | ICD-10-CM | POA: Diagnosis present

## 2017-06-18 DIAGNOSIS — F329 Major depressive disorder, single episode, unspecified: Secondary | ICD-10-CM | POA: Diagnosis present

## 2017-06-18 DIAGNOSIS — F419 Anxiety disorder, unspecified: Secondary | ICD-10-CM | POA: Diagnosis present

## 2017-06-18 DIAGNOSIS — Z87891 Personal history of nicotine dependence: Secondary | ICD-10-CM | POA: Diagnosis not present

## 2017-06-18 DIAGNOSIS — R918 Other nonspecific abnormal finding of lung field: Secondary | ICD-10-CM | POA: Diagnosis not present

## 2017-06-18 DIAGNOSIS — N179 Acute kidney failure, unspecified: Secondary | ICD-10-CM

## 2017-06-18 DIAGNOSIS — I679 Cerebrovascular disease, unspecified: Secondary | ICD-10-CM | POA: Diagnosis present

## 2017-06-18 DIAGNOSIS — Z7902 Long term (current) use of antithrombotics/antiplatelets: Secondary | ICD-10-CM

## 2017-06-18 DIAGNOSIS — K5730 Diverticulosis of large intestine without perforation or abscess without bleeding: Secondary | ICD-10-CM | POA: Diagnosis not present

## 2017-06-18 DIAGNOSIS — E875 Hyperkalemia: Secondary | ICD-10-CM

## 2017-06-18 DIAGNOSIS — E1122 Type 2 diabetes mellitus with diabetic chronic kidney disease: Secondary | ICD-10-CM | POA: Diagnosis present

## 2017-06-18 DIAGNOSIS — Z8673 Personal history of transient ischemic attack (TIA), and cerebral infarction without residual deficits: Secondary | ICD-10-CM

## 2017-06-18 DIAGNOSIS — Z951 Presence of aortocoronary bypass graft: Secondary | ICD-10-CM | POA: Diagnosis not present

## 2017-06-18 DIAGNOSIS — E1151 Type 2 diabetes mellitus with diabetic peripheral angiopathy without gangrene: Secondary | ICD-10-CM | POA: Diagnosis present

## 2017-06-18 DIAGNOSIS — Z79899 Other long term (current) drug therapy: Secondary | ICD-10-CM | POA: Diagnosis not present

## 2017-06-18 DIAGNOSIS — E785 Hyperlipidemia, unspecified: Secondary | ICD-10-CM | POA: Diagnosis present

## 2017-06-18 DIAGNOSIS — I10 Essential (primary) hypertension: Secondary | ICD-10-CM | POA: Diagnosis not present

## 2017-06-18 LAB — BASIC METABOLIC PANEL
Anion gap: 15 (ref 5–15)
Anion gap: 13 (ref 5–15)
BUN: 42 mg/dL — ABNORMAL HIGH (ref 6–20)
BUN: 40 mg/dL — ABNORMAL HIGH (ref 6–20)
CO2: 20 mmol/L — ABNORMAL LOW (ref 22–32)
CO2: 22 mmol/L (ref 22–32)
Calcium: 9.7 mg/dL (ref 8.9–10.3)
Calcium: 9 mg/dL (ref 8.9–10.3)
Chloride: 89 mmol/L — ABNORMAL LOW (ref 101–111)
Chloride: 97 mmol/L — ABNORMAL LOW (ref 101–111)
Creatinine, Ser: 1.65 mg/dL — ABNORMAL HIGH (ref 0.61–1.24)
Creatinine, Ser: 1.43 mg/dL — ABNORMAL HIGH (ref 0.61–1.24)
GFR calc Af Amer: 44 mL/min — ABNORMAL LOW (ref >60)
GFR calc Af Amer: 53 mL/min — ABNORMAL LOW (ref >60)
GFR calc non Af Amer: 38 mL/min — ABNORMAL LOW (ref >60)
GFR calc non Af Amer: 45 mL/min — ABNORMAL LOW (ref >60)
Glucose, Bld: 1173 mg/dL (ref 65–99)
Glucose, Bld: 832 mg/dL (ref 65–99)
Potassium: 6.7 mmol/L (ref 3.5–5.1)
Potassium: 5.3 mmol/L — ABNORMAL HIGH (ref 3.5–5.1)
Sodium: 124 mmol/L — ABNORMAL LOW (ref 135–145)
Sodium: 132 mmol/L — ABNORMAL LOW (ref 135–145)

## 2017-06-18 LAB — HEPATIC FUNCTION PANEL
ALT: 25 U/L (ref 17–63)
AST: 21 U/L (ref 15–41)
Albumin: 4.1 g/dL (ref 3.5–5.0)
Alkaline Phosphatase: 141 U/L — ABNORMAL HIGH (ref 38–126)
Bilirubin, Direct: 0.2 mg/dL (ref 0.1–0.5)
Indirect Bilirubin: 0.9 mg/dL (ref 0.3–0.9)
Total Bilirubin: 1.1 mg/dL (ref 0.3–1.2)
Total Protein: 7.3 g/dL (ref 6.5–8.1)

## 2017-06-18 LAB — URINALYSIS, ROUTINE W REFLEX MICROSCOPIC
Bacteria, UA: NONE SEEN
Bilirubin Urine: NEGATIVE
Glucose, UA: >=500 mg/dL — AB
Hgb urine dipstick: NEGATIVE
Ketones, ur: 5 mg/dL — AB
Leukocytes, UA: NEGATIVE
Nitrite: NEGATIVE
Protein, ur: NEGATIVE mg/dL
Specific Gravity, Urine: 1.026 (ref 1.005–1.030)
Squamous Epithelial / LPF: NONE SEEN
pH: 5 (ref 5.0–8.0)

## 2017-06-18 LAB — CBC
HCT: 48.1 % (ref 39.0–52.0)
Hemoglobin: 15.9 g/dL (ref 13.0–17.0)
MCH: 31.7 pg (ref 26.0–34.0)
MCHC: 33.1 g/dL (ref 30.0–36.0)
MCV: 96 fL (ref 78.0–100.0)
Platelets: 168 10*3/uL (ref 150–400)
RBC: 5.01 MIL/uL (ref 4.22–5.81)
RDW: 12.5 % (ref 11.5–15.5)
WBC: 10.8 10*3/uL — ABNORMAL HIGH (ref 4.0–10.5)

## 2017-06-18 LAB — BLOOD GAS, VENOUS
Acid-base deficit: 3.5 mmol/L — ABNORMAL HIGH (ref 0.0–2.0)
Bicarbonate: 20 mmol/L (ref 20.0–28.0)
O2 Saturation: 74.6 %
Patient temperature: 37
pCO2, Ven: 51 mmHg (ref 44.0–60.0)
pH, Ven: 7.265 (ref 7.250–7.430)
pO2, Ven: 47.7 mmHg — ABNORMAL HIGH (ref 32.0–45.0)

## 2017-06-18 LAB — TROPONIN I: Troponin I: <0.03 ng/mL (ref <0.03)

## 2017-06-18 LAB — CBG MONITORING, ED
Glucose-Capillary: >600 mg/dL (ref 65–99)
Glucose-Capillary: >600 mg/dL (ref 65–99)

## 2017-06-18 MED ORDER — BIOTENE DRY MOUTH MT LIQD
15.0000 mL | OROMUCOSAL | Status: DC | PRN
Start: 2017-06-18 — End: 2017-06-21

## 2017-06-18 MED ORDER — NORTRIPTYLINE HCL 25 MG PO CAPS
25.0000 mg | ORAL_CAPSULE | Freq: Every day | ORAL | Status: DC
  Administered 2017-06-19 – 2017-06-20 (×2): 25 mg via ORAL
  Filled 2017-06-18 (×2): qty 1

## 2017-06-18 MED ORDER — SODIUM CHLORIDE 0.9 % IV SOLN: INTRAVENOUS | Status: DC

## 2017-06-18 MED ORDER — DEXTROSE 50 % IV SOLN: 25.0000 mL | INTRAVENOUS | Status: DC | PRN

## 2017-06-18 MED ORDER — INSULIN ASPART 100 UNIT/ML ~~LOC~~ SOLN
10.0000 [IU] | Freq: Once | SUBCUTANEOUS | Status: AC
  Administered 2017-06-18: 10 [IU] via INTRAVENOUS
  Filled 2017-06-18: qty 1

## 2017-06-18 MED ORDER — ONDANSETRON HCL 4 MG PO TABS: 4.0000 mg | ORAL_TABLET | Freq: Four times a day (QID) | ORAL | Status: DC | PRN

## 2017-06-18 MED ORDER — ADULT MULTIVITAMIN W/MINERALS CH
1.0000 | ORAL_TABLET | Freq: Every day | ORAL | Status: DC
  Administered 2017-06-19 – 2017-06-21 (×3): 1 via ORAL
  Filled 2017-06-18 (×3): qty 1

## 2017-06-18 MED ORDER — SODIUM CHLORIDE 0.9 % IV SOLN
INTRAVENOUS | Status: DC
  Administered 2017-06-19: 05:00:00 via INTRAVENOUS

## 2017-06-18 MED ORDER — ACETAMINOPHEN 325 MG PO TABS
650.0000 mg | ORAL_TABLET | Freq: Four times a day (QID) | ORAL | Status: DC | PRN
  Administered 2017-06-19 – 2017-06-20 (×3): 650 mg via ORAL
  Filled 2017-06-18 (×3): qty 2

## 2017-06-18 MED ORDER — SODIUM CHLORIDE 0.9 % IV SOLN
INTRAVENOUS | Status: DC
  Administered 2017-06-18: 5.4 [IU]/h via INTRAVENOUS
  Filled 2017-06-18 (×2): qty 1

## 2017-06-18 MED ORDER — ONDANSETRON HCL 4 MG/2ML IJ SOLN: 4.0000 mg | Freq: Four times a day (QID) | INTRAMUSCULAR | Status: DC | PRN

## 2017-06-18 MED ORDER — CLOPIDOGREL BISULFATE 75 MG PO TABS
75.0000 mg | ORAL_TABLET | Freq: Every day | ORAL | Status: DC
  Administered 2017-06-19 – 2017-06-21 (×3): 75 mg via ORAL
  Filled 2017-06-18 (×3): qty 1

## 2017-06-18 MED ORDER — ACETAMINOPHEN 650 MG RE SUPP: 650.0000 mg | Freq: Four times a day (QID) | RECTAL | Status: DC | PRN

## 2017-06-18 MED ORDER — TEMAZEPAM 15 MG PO CAPS
30.0000 mg | ORAL_CAPSULE | Freq: Every day | ORAL | Status: DC
  Administered 2017-06-19 – 2017-06-20 (×2): 30 mg via ORAL
  Filled 2017-06-18 (×2): qty 2

## 2017-06-18 MED ORDER — SODIUM CHLORIDE 0.9 % IV SOLN
INTRAVENOUS | Status: DC
Start: 2017-06-18 — End: 2017-06-19
  Administered 2017-06-18: 21:00:00 via INTRAVENOUS

## 2017-06-18 MED ORDER — ENOXAPARIN SODIUM 40 MG/0.4ML ~~LOC~~ SOLN
40.0000 mg | SUBCUTANEOUS | Status: DC
  Administered 2017-06-19 – 2017-06-21 (×3): 40 mg via SUBCUTANEOUS
  Filled 2017-06-18 (×3): qty 0.4

## 2017-06-18 MED ORDER — DEXTROSE-NACL 5-0.45 % IV SOLN: INTRAVENOUS | Status: DC

## 2017-06-18 MED ORDER — DEXTROSE-NACL 5-0.45 % IV SOLN
INTRAVENOUS | Status: DC
  Administered 2017-06-19: 06:00:00 via INTRAVENOUS

## 2017-06-18 MED ORDER — SODIUM CHLORIDE 0.9 % IV SOLN
INTRAVENOUS | Status: DC
  Filled 2017-06-18: qty 1

## 2017-06-18 MED ORDER — IOPAMIDOL (ISOVUE-300) INJECTION 61%
100.0000 mL | Freq: Once | INTRAVENOUS | Status: AC | PRN
  Administered 2017-06-18: 75 mL via INTRAVENOUS

## 2017-06-18 MED ORDER — ALPRAZOLAM 0.5 MG PO TABS
1.0000 mg | ORAL_TABLET | Freq: Every day | ORAL | Status: DC
  Administered 2017-06-19 – 2017-06-20 (×2): 1 mg via ORAL
  Filled 2017-06-18 (×2): qty 2

## 2017-06-18 MED ORDER — SODIUM CHLORIDE 0.9 % IV BOLUS (SEPSIS)
1000.0000 mL | Freq: Once | INTRAVENOUS | Status: AC
  Administered 2017-06-18: 1000 mL via INTRAVENOUS

## 2017-06-18 MED ORDER — INSULIN REGULAR BOLUS VIA INFUSION
0.0000 [IU] | Freq: Three times a day (TID) | INTRAVENOUS | Status: DC
  Filled 2017-06-18: qty 10

## 2017-06-18 NOTE — ED Notes (Signed)
CRITICAL VALUE ALERT  Critical Value:  Glucose 1173  Date & Time Notied: 06/18/17  Provider Notified: Gilford Raid, EDP  Orders Received/Actions taken: Glucose stabilizer

## 2017-06-18 NOTE — ED Provider Notes (Signed)
[facility] EMERGENCY DEPARTMENT Provider Note   CSN: [id number] Arrival date & time: 06/18/17  1926     History   Chief Complaint Chief Complaint  Patient presents with  . Loss of Consciousness    HPI [name] is a 77 y.o. male.  Pt presents to the ED today with a loc early this morning.  The pt said he started having a very dry mouth last night.  He was very thirsty and drank a lot of water.  He got up in the night and went to the bathroom.  He is not sure what happened, but he fell and hit his left flank on something in the bathroom.  Pt denies any other injuries.  He is on plavix.  He denies cp or sob.      Past Medical History:  Diagnosis Date  . Abdominal aortic aneurysm (HCC)    3.5 cm in 2011  . Adenomatous polyps   . Arteriosclerotic cardiovascular disease (ASCVD) 2006   CABG in 2006; normal EF  . CAD (coronary artery disease)   . Carotid stenosis   . 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 Degenerative joint disease of spine 2011   Lumbosacral and cervical; ant. discectomy, fusion and plate in 1324  . DIABETES MELLITUS, TYPE II 01/31/2010  . Diverticulosis   . ED (erectile dysfunction)   . Hyperlipidemia   . Hypertension    LVH  . Insomnia   . Neuropathy   . Peripheral vascular disease ([place])    Left femoral art. stent; bilateral renal art. stents-2007; ABIs-nl right; 0.80 left in 5/06; angio in 2007-95% bilat. renal; diffuse aortoiliac ASCVD +ulceration;     . Sinus headache   . Stroke ([place])   . Tobacco abuse, in remission    [id number]    Patient Active Problem List   Diagnosis Date Noted  . Chronic renal failure in pediatric patient, stage 3 (moderate) ([place]) 02/11/2017  . Mass of left wrist 01/22/2014  . Other and unspecified hyperlipidemia 02/03/2013  . Insomnia 01/29/2013  . Arteriosclerotic cardiovascular disease (ASCVD)   . Cerebrovascular disease   .  Hypertension   . Hyperlipidemia   . Tobacco abuse, in remission   . Adenomatous polyps   . Degenerative joint disease of spine   . Abdominal aortic aneurysm ([place]) 01/31/2010  . PERIPHERAL VASCULAR DISEASE 01/31/2010    Past Surgical History:  Procedure Laterality Date  . ANTERIOR FUSION CERVICAL SPINE  2005   . COLONOSCOPY W/ POLYPECTOMY  2010  . CORONARY ARTERY BYPASS GRAFT  2006   LIMA to LAD; SVG to marginal; SVG to L posterolateral; nondoninant RCA   . LUMBAR SPINE SURGERY  1985    Discectomy and fusion  . MASS EXCISION Left 01/22/2014   Procedure: EXCISION LEFT WRIST MASS;  Surgeon: Mcarthur Rossetti, MD;  Location: [facility];  Service: Orthopedics;  Laterality: Left;       Home Medications    Prior to Admission medications   Medication Sig Start Date End Date Taking? Authorizing Provider  ALPRAZolam Duanne Moron) 1 MG tablet take 1 tablet by mouth at bedtime if needed 02/18/17   Kathyrn Drown, MD  clopidogrel (PLAVIX) 75 MG tablet Take 1 tablet (75 mg total) by mouth daily. 02/11/17   Mikey Kirschner, MD  hydrochlorothiazide (HYDRODIURIL) 25 MG tablet take 1 tablet by mouth once daily 02/11/17   Baltazar Apo  S, MD  lisinopril (PRINIVIL,ZESTRIL) 20 MG tablet Take 1 tablet (20 mg total) by mouth 2 (two) times daily. 02/11/17   Mikey Kirschner, MD  Multiple Vitamin (MULTIVITAMIN) tablet Take 1 tablet by mouth daily.      [provider]  neomycin-polymyxin-hydrocortisone (CORTISPORIN) otic solution Place 3 drops into both ears 4 (four) times daily. 02/14/15   Mikey Kirschner, MD  nortriptyline (PAMELOR) 25 MG capsule Take 1 capsule (25 mg total) by mouth at bedtime. 02/11/17   Mikey Kirschner, MD  Omega-3 Fatty Acids (FISH OIL) 1000 MG CAPS Take 1 capsule by mouth daily.      [provider]  simvastatin (ZOCOR) 80 MG tablet Take 1 tablet (80 mg total) by mouth daily. 02/11/17   Mikey Kirschner, MD  simvastatin (ZOCOR) 80 MG tablet take 1 tablet by mouth  once daily 03/18/17   Mikey Kirschner, MD  temazepam (RESTORIL) 30 MG capsule Take 1 capsule (30 mg total) by mouth at bedtime. 02/11/17   Mikey Kirschner, MD    Family History Family History  Problem Relation Age of Onset  . Stroke Father   . Pulmonary embolism Mother        Following hip fracture  . Colon cancer Brother   . Hypertension Brother     Social History Social History   Tobacco Use  . Smoking status: Former Smoker    Packs/day: 1.00    Years: 50.00    Pack years: 50.00    Types: Cigarettes    Start date: 05/10/1952    Last attempt to quit: 10/18/2003    Years since quitting: 13.6  . Smokeless tobacco: Never Used  . Tobacco comment: 50 pack years; quit in 2005   Substance Use Topics  . Alcohol use: No    Alcohol/week: 0.0 oz  . Drug use: No     Allergies   Lipitor [atorvastatin]   Review of Systems Review of Systems  Genitourinary: Positive for flank pain.  Neurological: Positive for syncope.  All other systems reviewed and are negative.    Physical Exam Updated Vital Signs BP (!) 142/80   Pulse (!) 103   Temp (!) 97.5 F (36.4 C) (Oral)   Resp 15   Ht 5\' 10"  (1.778 m)   Wt 98 kg (216 lb)   SpO2 90%   BMI 30.99 kg/m   Physical Exam  Constitutional: He is oriented to person, place, and time. He appears well-developed and well-nourished.  HENT:  Head: Normocephalic and atraumatic.  Right Ear: External ear normal.  Left Ear: External ear normal.  Nose: Nose normal.  Mouth/Throat: Oropharynx is clear and moist.  Eyes: Conjunctivae and EOM are normal. Pupils are equal, round, and reactive to light.  Neck: Normal range of motion. Neck supple.  Cardiovascular: Regular rhythm, normal heart sounds and intact distal pulses. Tachycardia present.  Pulmonary/Chest: Effort normal and breath sounds normal.  Abdominal: Soft. Bowel sounds are normal.    Musculoskeletal: Normal range of motion.  Neurological: He is alert and oriented to person,  place, and time.  Skin: Skin is warm. Capillary refill takes less than 2 seconds.  Psychiatric: He has a normal mood and affect. His behavior is normal. Judgment and thought content normal.  Nursing note and vitals reviewed.    ED Treatments / Results  Labs (all labs ordered are listed, but only abnormal results are displayed) Labs Reviewed  BASIC METABOLIC PANEL - Abnormal; Notable for the following components:  Result Value   Sodium 124 (*)    Potassium 6.7 (*)    Chloride 89 (*)    CO2 20 (*)    Glucose, Bld 1,173 (*)    BUN 42 (*)    Creatinine, Ser 1.65 (*)    GFR calc non Af Amer 38 (*)    GFR calc Af Amer 44 (*)    All other components within normal limits  CBC - Abnormal; Notable for the following components:   WBC 10.8 (*)    All other components within normal limits  URINALYSIS, ROUTINE W REFLEX MICROSCOPIC - Abnormal; Notable for the following components:   Color, Urine STRAW (*)    Glucose, UA >=500 (*)    Ketones, ur 5 (*)    All other components within normal limits  HEPATIC FUNCTION PANEL - Abnormal; Notable for the following components:   Alkaline Phosphatase 141 (*)    All other components within normal limits  CBG MONITORING, ED - Abnormal; Notable for the following components:   Glucose-Capillary >600 (*)    All other components within normal limits  CBG MONITORING, ED - Abnormal; Notable for the following components:   Glucose-Capillary >600 (*)    All other components within normal limits  TROPONIN I  BLOOD GAS, VENOUS  CBG MONITORING, ED    EKG  EKG Interpretation  Date/Time:  Tuesday June 18 2017 20:15:50 EST Ventricular Rate:  104 PR Interval:  182 QRS Duration: 95 QT Interval:  338 QTC Calculation: 445 R Axis:   -95 Text Interpretation:  Sinus tachycardia Inferior infarct, old Abnormal lateral Q waves Anterior infarct, old Confirmed by Isla Pence [id number]) on 06/18/2017 8:18:26 PM       Radiology No results  found.  Procedures Procedures (including critical care time)  Medications Ordered in ED Medications  sodium chloride 0.9 % bolus 1,000 mL (0 mLs Intravenous Stopped 06/18/17 2105)    And  0.9 %  sodium chloride infusion ( Intravenous New Bag/Given 06/18/17 2105)  antiseptic oral rinse (BIOTENE) solution 15 mL (not administered)  insulin regular bolus via infusion 0-10 Units (not administered)  insulin regular (NOVOLIN R,HUMULIN R) 100 Units in sodium chloride 0.9 % 100 mL (1 Units/mL) infusion (not administered)  dextrose 50 % solution 25 mL (not administered)  0.9 %  sodium chloride infusion (not administered)  dextrose 5 %-0.45 % sodium chloride infusion (not administered)  insulin aspart (novoLOG) injection 10 Units (10 Units Intravenous Given 06/18/17 2029)  iopamidol (ISOVUE-300) 61 % injection 100 mL (75 mLs Intravenous Contrast Given 06/18/17 2141)     Initial Impression / Assessment and Plan / ED Course  I have reviewed the triage vital signs and the nursing notes.  Pertinent labs & imaging results that were available during my care of the patient were reviewed by me and considered in my medical decision making (see chart for details).  CRITICAL CARE Performed by: Isla Pence   Total critical care time: 30 minutes  Critical care time was exclusive of separately billable procedures and treating other patients.  Critical care was necessary to treat or prevent imminent or life-threatening deterioration.  Critical care was time spent personally by me on the following activities: development of treatment plan with patient and/or surrogate as well as nursing, discussions with consultants, evaluation of patient's response to treatment, examination of patient, obtaining history from patient or surrogate, ordering and performing treatments and interventions, ordering and review of laboratory studies, ordering and review of radiographic studies, pulse oximetry and  re-evaluation of  patient's condition.  Pt given IVFs and started on an insulin drip with the glucose stabilizer.  His potassium is elevated, but that will come down with IV insulin.  Pt d/w Dr. Manuella Ghazi (triad) for admission.  Final Clinical Impressions(s) / ED Diagnoses   Final diagnoses:  Dehydration  AKI (acute kidney injury) ([place])  Syncope, unspecified syncope type  Diabetic ketoacidosis without coma associated with type 2 diabetes mellitus ([place])  Hyperkalemia    ED Discharge Orders    None       Isla Pence, MD 06/18/17 2201

## 2017-06-18 NOTE — H&P (Addendum)
History and Physical    [name] BOF:[id number] DOB: 02-09-1941 DOA: 06/18/2017  PCP: Mikey Kirschner, MD   Patient coming from: Home  Chief Complaint: Increased thirst  HPI: [name] is a 77 y.o. male with medical history significant for peripheral vascular disease, CAD with prior CABG, CKD III, cerebrovascular disease with prior TIA, prior right renal stent in 2007, hypertension, dyslipidemia, insomnia, neuropathy, and what appears to be prediabetes with prior A1c of 6.1% on 02/2016.  He is not currently on any medications for blood glucose control.  He presented to the emergency department today with worsening thirst as well as some associated weakness and nausea that began yesterday.  He states that he has been drinking plenty of juices and eating ice cream with worsening symptomatology noted.  He denies any abdominal pain, vomiting, diarrhea, fever, chills, or upper respiratory symptomatology.  He states that he did fall earlier this morning to his left side on account of the weakness and landed on his left chest which is bothering him.  He denies any shortness of breath or respiratory distress.  No confusion or loss of consciousness noted.   ED Course: Patient noted to have blood glucose of 1173mg /dL, potassium is 6.7, and corrected sodium is 141 with anion gap corrected of 32.  Creatinine is 1.65 with 1.17 noted 4 months ago.  He has been given 1 L IV fluid bolus and I have requested a second.  He is currently on IV fluid and is due to be started on an insulin drip which I do not currently see started at the bedside.  Vital signs are currently stable.  CT imaging unremarkable aside from a 5 cm infrarenal abdominal aortic aneurysm which patient is aware of.  Review of Systems: As per HPI otherwise 10 point review of systems negative.   Past Medical History:  Diagnosis Date  . Abdominal aortic aneurysm (HCC)    3.5 cm in 2011  . Adenomatous polyps   . Arteriosclerotic  cardiovascular disease (ASCVD) 2006   CABG in 2006; normal EF  . CAD (coronary artery disease)   . Carotid stenosis   . 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 Degenerative joint disease of spine 2011   Lumbosacral and cervical; ant. discectomy, fusion and plate in 7782  . DIABETES MELLITUS, TYPE II 01/31/2010  . Diverticulosis   . ED (erectile dysfunction)   . Hyperlipidemia   . Hypertension    LVH  . Insomnia   . Neuropathy   . Peripheral vascular disease ([place])    Left femoral art. stent; bilateral renal art. stents-2007; ABIs-nl right; 0.80 left in 5/06; angio in 2007-95% bilat. renal; diffuse aortoiliac ASCVD +ulceration;     . Sinus headache   . Stroke ([place])   . Tobacco abuse, in remission    [id number]    Past Surgical History:  Procedure Laterality Date  . ANTERIOR FUSION CERVICAL SPINE  2005   . COLONOSCOPY W/ POLYPECTOMY  2010  . CORONARY ARTERY BYPASS GRAFT  2006   LIMA to LAD; SVG to marginal; SVG to L posterolateral; nondoninant RCA   . LUMBAR SPINE SURGERY  1985    Discectomy and fusion  . MASS EXCISION Left 01/22/2014   Procedure: EXCISION LEFT WRIST MASS;  Surgeon: Mcarthur Rossetti, MD;  Location: [facility];  Service: Orthopedics;  Laterality: Left;     reports that he quit  smoking about 13 years ago. His smoking use included cigarettes. He started smoking about 65 years ago. He has a 50.00 pack-year smoking history. he has never used smokeless tobacco. He reports that he does not drink alcohol or use drugs.  Allergies  Allergen Reactions  . Lipitor [Atorvastatin] Other (See Comments)    Side effects-muscle aches    Family History  Problem Relation Age of Onset  . Stroke Father   . Pulmonary embolism Mother        Following hip fracture  . Colon cancer Brother   . Hypertension Brother     Prior to Admission medications   Medication Sig Start Date End Date Taking?  Authorizing Provider  ALPRAZolam Duanne Moron) 1 MG tablet take 1 tablet by mouth at bedtime if needed Patient taking differently: Take 1 mg by mouth at bedtime. take 1 tablet by mouth at bedtime if needed 02/18/17  Yes Luking, Elayne Snare, MD  clopidogrel (PLAVIX) 75 MG tablet Take 1 tablet (75 mg total) by mouth daily. 02/11/17  Yes Mikey Kirschner, MD  hydrochlorothiazide (HYDRODIURIL) 25 MG tablet take 1 tablet by mouth once daily 02/11/17  Yes Mikey Kirschner, MD  lisinopril (PRINIVIL,ZESTRIL) 20 MG tablet Take 1 tablet (20 mg total) by mouth 2 (two) times daily. 02/11/17  Yes Mikey Kirschner, MD  Multiple Vitamin (MULTIVITAMIN) tablet Take 1 tablet by mouth daily.     Yes [provider]  nortriptyline (PAMELOR) 25 MG capsule Take 1 capsule (25 mg total) by mouth at bedtime. 02/11/17  Yes Mikey Kirschner, MD  Omega-3 Fatty Acids (FISH OIL) 1000 MG CAPS Take 1 capsule by mouth daily.     Yes [provider]  simvastatin (ZOCOR) 80 MG tablet Take 1 tablet (80 mg total) by mouth daily. 02/11/17  Yes Mikey Kirschner, MD  temazepam (RESTORIL) 30 MG capsule Take 1 capsule (30 mg total) by mouth at bedtime. 02/11/17  Yes Mikey Kirschner, MD    Physical Exam: Vitals:   06/18/17 1935 06/18/17 2000  BP: 126/80 (!) 142/80  Pulse: (!) 106 (!) 103  Resp: 15   Temp: (!) 97.5 F (36.4 C)   TempSrc: Oral   SpO2: 97% 90%  Weight: 98 kg (216 lb)   Height: 5\' 10"  (1.778 m)     Constitutional: NAD, calm, comfortable Vitals:   06/18/17 1935 06/18/17 2000  BP: 126/80 (!) 142/80  Pulse: (!) 106 (!) 103  Resp: 15   Temp: (!) 97.5 F (36.4 C)   TempSrc: Oral   SpO2: 97% 90%  Weight: 98 kg (216 lb)   Height: 5\' 10"  (1.778 m)    Eyes: lids and conjunctivae normal ENMT: Mucous membranes are dry Neck: normal, supple Respiratory: clear to auscultation bilaterally. Normal respiratory effort. No accessory muscle use.  Cardiovascular: Regular rate and rhythm, no murmurs. No extremity  edema. Abdomen: no tenderness, no distention. Bowel sounds positive.  Musculoskeletal:  No joint deformity upper and lower extremities.   Skin: no rashes, lesions, ulcers.  Psychiatric: Normal judgment and insight. Alert and oriented x 3. Normal mood.   Labs on Admission: I have personally reviewed following labs and imaging studies  CBC: Recent Labs  Lab 06/18/17 1938  WBC 10.8*  HGB 15.9  HCT 48.1  MCV 96.0  PLT 749   Basic Metabolic Panel: Recent Labs  Lab 06/18/17 1938  NA 124*  K 6.7*  CL 89*  CO2 20*  GLUCOSE 1,173*  BUN 42*  CREATININE 1.65*  CALCIUM 9.7   GFR: Estimated Creatinine Clearance: 43.3 mL/min (A) (by C-G formula based on SCr of 1.65 mg/dL (H)). Liver Function Tests: Recent Labs  Lab 06/18/17 2010  AST 21  ALT 25  ALKPHOS 141*  BILITOT 1.1  PROT 7.3  ALBUMIN 4.1   No results for input(s): LIPASE, AMYLASE in the last 168 hours. No results for input(s): AMMONIA in the last 168 hours. Coagulation Profile: No results for input(s): INR, PROTIME in the last 168 hours. Cardiac Enzymes: Recent Labs  Lab 06/18/17 2010  TROPONINI <0.03   BNP (last 3 results) No results for input(s): PROBNP in the last 8760 hours. HbA1C: No results for input(s): HGBA1C in the last 72 hours. CBG: Recent Labs  Lab 06/18/17 2003 06/18/17 2042  GLUCAP >600* >600*   Lipid Profile: No results for input(s): CHOL, HDL, LDLCALC, TRIG, CHOLHDL, LDLDIRECT in the last 72 hours. Thyroid Function Tests: No results for input(s): TSH, T4TOTAL, FREET4, T3FREE, THYROIDAB in the last 72 hours. Anemia Panel: No results for input(s): VITAMINB12, FOLATE, FERRITIN, TIBC, IRON, RETICCTPCT in the last 72 hours. Urine analysis:    Component Value Date/Time   COLORURINE STRAW (A) 06/18/2017 1938   APPEARANCEUR CLEAR 06/18/2017 1938   LABSPEC 1.026 06/18/2017 1938   PHURINE 5.0 06/18/2017 1938   GLUCOSEU >=500 (A) 06/18/2017 1938   HGBUR NEGATIVE 06/18/2017 1938    BILIRUBINUR NEGATIVE 06/18/2017 1938   KETONESUR 5 (A) 06/18/2017 1938   PROTEINUR NEGATIVE 06/18/2017 1938   UROBILINOGEN 0.2 08/26/2009 1904   NITRITE NEGATIVE 06/18/2017 1938   LEUKOCYTESUR NEGATIVE 06/18/2017 1938    Radiological Exams on Admission: Ct Head Wo Contrast  Result Date: 06/18/2017 CLINICAL DATA:  Syncope. EXAM: CT HEAD WITHOUT CONTRAST TECHNIQUE: Contiguous axial images were obtained from the base of the skull through the vertex without intravenous contrast. COMPARISON:  January 20, 2007 FINDINGS: Brain: No subdural, epidural, or subarachnoid hemorrhage. A prominent vein over the right convexity is stable since 2008. Cerebellum, brainstem, and basal cisterns are normal. Ventricles and sulci are stable. No mass effect or midline shift. No acute cortical ischemia or infarct. Vascular: Calcified atherosclerotic changes in the intracranial carotids. Skull: Normal. Negative for fracture or focal lesion. Sinuses/Orbits: No acute finding. Other: None. IMPRESSION: No acute intracranial abnormalities identified. Electronically Signed   By: Dorise Bullion III M.D   On: 06/18/2017 22:17   Ct Chest W Contrast  Result Date: 06/18/2017 CLINICAL DATA:  Syncopal episode, hit left flank left flank pain EXAM: CT CHEST, ABDOMEN, AND PELVIS WITH CONTRAST TECHNIQUE: Multidetector CT imaging of the chest, abdomen and pelvis was performed following the standard protocol during bolus administration of intravenous contrast. CONTRAST:  30mL ISOVUE-300 IOPAMIDOL (ISOVUE-300) INJECTION 61% COMPARISON:  12/22/2014 CT abdomen pelvis FINDINGS: CT CHEST FINDINGS Cardiovascular: Nonaneurysmal aorta. Moderate aortic atherosclerosis. Post CABG changes. Coronary artery calcification. Normal heart size. No pericardial effusion. Mediastinum/Nodes: No evidence for mediastinal hematoma. Midline trachea. No thyroid mass. No significantly enlarged lymph nodes. The esophagus is within normal limits. Lungs/Pleura: Lungs are  clear. No pleural effusion or pneumothorax. Musculoskeletal: Post sternotomy. No acute or suspicious lesion. Partially visualized hardware in the cervical spine. Degenerative changes. CT ABDOMEN PELVIS FINDINGS Hepatobiliary: Multiple cysts in the liver. No calcified gallstones or biliary dilatation. Small vascular calcification at the porta hepatis. Pancreas: Unremarkable. No pancreatic ductal dilatation or surrounding inflammatory changes. Spleen: Normal in size without focal abnormality. Adrenals/Urinary Tract: Stable nodularity of left adrenal gland. Right adrenal gland is normal. Intrarenal vascular calcifications. Punctate nonobstructing stone  in the kidneys bilaterally. The bladder is normal Stomach/Bowel: Stomach is within normal limits. Appendix appears normal. No evidence of bowel wall thickening, distention, or inflammatory changes. Sigmoid colon diverticular disease without acute inflammation. Vascular/Lymphatic: Extensive aortic atherosclerosis. Bilobed infrarenal abdominal aortic aneurysm, measuring 5 cm in maximum diameter. Aneurysm terminates at the bifurcation. 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 No significantly enlarged lymph nodes. Reproductive: Prostate is unremarkable. Other: Fat within the left greater than right inguinal canal. No free air or free fluid. Musculoskeletal: Degenerative changes. No acute or suspicious lesion IMPRESSION: 1. No CT evidence for acute intra-abdominal, intrathoracic or intrapelvic abnormality. 2. Bilobed infrarenal abdominal aortic aneurysm measuring up to 5 cm in diameter. Recommend followup by abdomen and pelvis CTA in 3-6 months, and vascular surgery referral/consultation if not already obtained. This recommendation follows ACR consensus guidelines: White Paper of the ACR Incidental Findings Committee II on Vascular Findings. J Am Coll Radiol 2013; 10:789-794. 3. Sigmoid colon diverticular disease without acute inflammation. Electronically  Signed   By: Donavan Foil M.D.   On: 06/18/2017 22:14   Ct Abdomen Pelvis W Contrast  Result Date: 06/18/2017 CLINICAL DATA:  Syncopal episode, hit left flank left flank pain EXAM: CT CHEST, ABDOMEN, AND PELVIS WITH CONTRAST TECHNIQUE: Multidetector CT imaging of the chest, abdomen and pelvis was performed following the standard protocol during bolus administration of intravenous contrast. CONTRAST:  62mL ISOVUE-300 IOPAMIDOL (ISOVUE-300) INJECTION 61% COMPARISON:  12/22/2014 CT abdomen pelvis FINDINGS: CT CHEST FINDINGS Cardiovascular: Nonaneurysmal aorta. Moderate aortic atherosclerosis. Post CABG changes. Coronary artery calcification. Normal heart size. No pericardial effusion. Mediastinum/Nodes: No evidence for mediastinal hematoma. Midline trachea. No thyroid mass. No significantly enlarged lymph nodes. The esophagus is within normal limits. Lungs/Pleura: Lungs are clear. No pleural effusion or pneumothorax. Musculoskeletal: Post sternotomy. No acute or suspicious lesion. Partially visualized hardware in the cervical spine. Degenerative changes. CT ABDOMEN PELVIS FINDINGS Hepatobiliary: Multiple cysts in the liver. No calcified gallstones or biliary dilatation. Small vascular calcification at the porta hepatis. Pancreas: Unremarkable. No pancreatic ductal dilatation or surrounding inflammatory changes. Spleen: Normal in size without focal abnormality. Adrenals/Urinary Tract: Stable nodularity of left adrenal gland. Right adrenal gland is normal. Intrarenal vascular calcifications. Punctate nonobstructing stone in the kidneys bilaterally. The bladder is normal Stomach/Bowel: Stomach is within normal limits. Appendix appears normal. No evidence of bowel wall thickening, distention, or inflammatory changes. Sigmoid colon diverticular disease without acute inflammation. Vascular/Lymphatic: Extensive aortic atherosclerosis. Bilobed infrarenal abdominal aortic aneurysm, measuring 5 cm in maximum diameter.  Aneurysm terminates at the bifurcation. 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 No significantly enlarged lymph nodes. Reproductive: Prostate is unremarkable. Other: Fat within the left greater than right inguinal canal. No free air or free fluid. Musculoskeletal: Degenerative changes. No acute or suspicious lesion IMPRESSION: 1. No CT evidence for acute intra-abdominal, intrathoracic or intrapelvic abnormality. 2. Bilobed infrarenal abdominal aortic aneurysm measuring up to 5 cm in diameter. Recommend followup by abdomen and pelvis CTA in 3-6 months, and vascular surgery referral/consultation if not already obtained. This recommendation follows ACR consensus guidelines: White Paper of the ACR Incidental Findings Committee II on Vascular Findings. J Am Coll Radiol 2013; 10:789-794. 3. Sigmoid colon diverticular disease without acute inflammation. Electronically Signed   By: Donavan Foil M.D.   On: 06/18/2017 22:14    EKG: Independently reviewed. ST; 104 bpm; no ischemic changes.  Assessment/Plan Principal Problem:   DKA (diabetic ketoacidoses) (HCC) Active Problems:   Abdominal aortic aneurysm (HCC)   PERIPHERAL VASCULAR DISEASE  Arteriosclerotic cardiovascular disease (ASCVD)   Cerebrovascular disease   Hypertension   Hyperlipidemia   Hyperkalemia    1. DKA.  Patient has no prior diabetes diagnosis and A1c was noted to be 6.1% on 02/2016.  This does not appear to have been followed up recently.  N.p.o. except ice chips and medications for now with continuation of aggressive IV fluid, and insulin drip protocol.  BMP every 4 hours to monitor anion gap.  Continue on normal saline and switch to D5 half-normal saline once blood glucose reaches less than 250mg /dL.  No feeding trials until anion gap has closed on BMP at which point long-acting insulin could be administered to wean off insulin drip. Check A1c. 2. Hyperkalemia.  Continue to monitor and repeat BMP and this should start to  resolve with insulin administration.  No significant EKG changes noted at this time and therefore I will not administer calcium gluconate or other treatment. 3. AK I on CKD stage III.  Continue on IV fluid and monitor response as patient is dehydrated which is the likely contributing factor.  Monitor inputs and outputs as well as daily weights.  Renally dose medications and avoid nephrotoxic agents.  Monitor on repeat lab work. 4. Infrarenal abdominal aortic aneurysm.  Monitor on repeat CTA in 3-6 months.  Patient has history of right renal stent in 2007.  This is currently asymptomatic. 5. Hypertension.  Hold current home medications and will place on hydralazine as needed. 6. Dyslipidemia.  Hold current home medications until diet restarted. 7. Insomnia.  Continue home Restoril.   DVT prophylaxis: Lovenox Code Status: Full Family Communication: Wife at bedside Disposition Plan:Home when stable Consults called:None Admission status: Inpatient, SDU   Jamisha Hoeschen D Manuella Ghazi DO [facility] Pager [phone]  If 7PM-7AM, please contact night-coverage [email or web address] Password [facility]  06/18/2017, 10:52 PM

## 2017-06-18 NOTE — ED Notes (Addendum)
CRITICAL VALUE ALERT  Critical Value:  Potassium 6.7  Date & Time Notied:  06/18/17 at 2130   Provider Notified: Gilford Raid EDP  Orders Received/Actions taken: No orders at this time

## 2017-06-18 NOTE — ED Triage Notes (Signed)
Pt states he had a syncopal episode last night in the bathroom striking left flank. Pt c/o flank pain, dry mouth and hoarseness.

## 2017-06-19 ENCOUNTER — Other Ambulatory Visit: Payer: Self-pay

## 2017-06-19 DIAGNOSIS — I679 Cerebrovascular disease, unspecified: Secondary | ICD-10-CM

## 2017-06-19 DIAGNOSIS — I2510 Atherosclerotic heart disease of native coronary artery without angina pectoris: Secondary | ICD-10-CM

## 2017-06-19 DIAGNOSIS — E860 Dehydration: Secondary | ICD-10-CM

## 2017-06-19 LAB — BASIC METABOLIC PANEL
Anion gap: 11 (ref 5–15)
Anion gap: 8 (ref 5–15)
Anion gap: 10 (ref 5–15)
Anion gap: 12 (ref 5–15)
Anion gap: 11 (ref 5–15)
Anion gap: 10 (ref 5–15)
BUN: 37 mg/dL — ABNORMAL HIGH (ref 6–20)
BUN: 33 mg/dL — ABNORMAL HIGH (ref 6–20)
BUN: 31 mg/dL — ABNORMAL HIGH (ref 6–20)
BUN: 31 mg/dL — ABNORMAL HIGH (ref 6–20)
BUN: 33 mg/dL — ABNORMAL HIGH (ref 6–20)
BUN: 31 mg/dL — ABNORMAL HIGH (ref 6–20)
CO2: 23 mmol/L (ref 22–32)
CO2: 27 mmol/L (ref 22–32)
CO2: 26 mmol/L (ref 22–32)
CO2: 23 mmol/L (ref 22–32)
CO2: 21 mmol/L — ABNORMAL LOW (ref 22–32)
CO2: 18 mmol/L — ABNORMAL LOW (ref 22–32)
Calcium: 9.6 mg/dL (ref 8.9–10.3)
Calcium: 9.6 mg/dL (ref 8.9–10.3)
Calcium: 9.6 mg/dL (ref 8.9–10.3)
Calcium: 9.4 mg/dL (ref 8.9–10.3)
Calcium: 9 mg/dL (ref 8.9–10.3)
Calcium: 8.7 mg/dL — ABNORMAL LOW (ref 8.9–10.3)
Chloride: 105 mmol/L (ref 101–111)
Chloride: 106 mmol/L (ref 101–111)
Chloride: 104 mmol/L (ref 101–111)
Chloride: 102 mmol/L (ref 101–111)
Chloride: 102 mmol/L (ref 101–111)
Chloride: 105 mmol/L (ref 101–111)
Creatinine, Ser: 1.39 mg/dL — ABNORMAL HIGH (ref 0.61–1.24)
Creatinine, Ser: 1.22 mg/dL (ref 0.61–1.24)
Creatinine, Ser: 1.12 mg/dL (ref 0.61–1.24)
Creatinine, Ser: 1.15 mg/dL (ref 0.61–1.24)
Creatinine, Ser: 1.14 mg/dL (ref 0.61–1.24)
Creatinine, Ser: 1.07 mg/dL (ref 0.61–1.24)
GFR calc Af Amer: 54 mL/min — ABNORMAL LOW (ref >60)
GFR calc Af Amer: >60 mL/min (ref >60)
GFR calc Af Amer: >60 mL/min (ref >60)
GFR calc Af Amer: >60 mL/min (ref >60)
GFR calc Af Amer: >60 mL/min (ref >60)
GFR calc Af Amer: >60 mL/min (ref >60)
GFR calc non Af Amer: 47 mL/min — ABNORMAL LOW (ref >60)
GFR calc non Af Amer: 55 mL/min — ABNORMAL LOW (ref >60)
GFR calc non Af Amer: >60 mL/min (ref >60)
GFR calc non Af Amer: 59 mL/min — ABNORMAL LOW (ref >60)
GFR calc non Af Amer: 60 mL/min — ABNORMAL LOW (ref >60)
GFR calc non Af Amer: >60 mL/min (ref >60)
Glucose, Bld: 401 mg/dL — ABNORMAL HIGH (ref 65–99)
Glucose, Bld: 202 mg/dL — ABNORMAL HIGH (ref 65–99)
Glucose, Bld: 160 mg/dL — ABNORMAL HIGH (ref 65–99)
Glucose, Bld: 231 mg/dL — ABNORMAL HIGH (ref 65–99)
Glucose, Bld: 411 mg/dL — ABNORMAL HIGH (ref 65–99)
Glucose, Bld: 351 mg/dL — ABNORMAL HIGH (ref 65–99)
Potassium: 5.1 mmol/L (ref 3.5–5.1)
Potassium: 4.6 mmol/L (ref 3.5–5.1)
Potassium: 4.3 mmol/L (ref 3.5–5.1)
Potassium: 4.1 mmol/L (ref 3.5–5.1)
Potassium: 4.7 mmol/L (ref 3.5–5.1)
Potassium: 4.6 mmol/L (ref 3.5–5.1)
Sodium: 139 mmol/L (ref 135–145)
Sodium: 141 mmol/L (ref 135–145)
Sodium: 140 mmol/L (ref 135–145)
Sodium: 137 mmol/L (ref 135–145)
Sodium: 134 mmol/L — ABNORMAL LOW (ref 135–145)
Sodium: 133 mmol/L — ABNORMAL LOW (ref 135–145)

## 2017-06-19 LAB — CBC
HCT: 44.1 % (ref 39.0–52.0)
Hemoglobin: 15.3 g/dL (ref 13.0–17.0)
MCH: 31.7 pg (ref 26.0–34.0)
MCHC: 34.7 g/dL (ref 30.0–36.0)
MCV: 91.3 fL (ref 78.0–100.0)
Platelets: 173 10*3/uL (ref 150–400)
RBC: 4.83 MIL/uL (ref 4.22–5.81)
RDW: 12 % (ref 11.5–15.5)
WBC: 14 10*3/uL — ABNORMAL HIGH (ref 4.0–10.5)

## 2017-06-19 LAB — CBG MONITORING, ED
Glucose-Capillary: 345 mg/dL — ABNORMAL HIGH (ref 65–99)
Glucose-Capillary: 409 mg/dL — ABNORMAL HIGH (ref 65–99)
Glucose-Capillary: 525 mg/dL (ref 65–99)
Glucose-Capillary: >600 mg/dL (ref 65–99)
Glucose-Capillary: >600 mg/dL (ref 65–99)

## 2017-06-19 LAB — GLUCOSE, CAPILLARY
Glucose-Capillary: 272 mg/dL — ABNORMAL HIGH (ref 65–99)
Glucose-Capillary: 236 mg/dL — ABNORMAL HIGH (ref 65–99)
Glucose-Capillary: 203 mg/dL — ABNORMAL HIGH (ref 65–99)
Glucose-Capillary: 204 mg/dL — ABNORMAL HIGH (ref 65–99)
Glucose-Capillary: 151 mg/dL — ABNORMAL HIGH (ref 65–99)
Glucose-Capillary: 146 mg/dL — ABNORMAL HIGH (ref 65–99)
Glucose-Capillary: 158 mg/dL — ABNORMAL HIGH (ref 65–99)
Glucose-Capillary: 181 mg/dL — ABNORMAL HIGH (ref 65–99)
Glucose-Capillary: 267 mg/dL — ABNORMAL HIGH (ref 65–99)
Glucose-Capillary: 327 mg/dL — ABNORMAL HIGH (ref 65–99)

## 2017-06-19 LAB — MRSA PCR SCREENING: MRSA by PCR: NEGATIVE

## 2017-06-19 MED ORDER — LIVING WELL WITH DIABETES BOOK
Freq: Once | Status: AC
  Administered 2017-06-19: 1
  Filled 2017-06-19: qty 1

## 2017-06-19 MED ORDER — INSULIN GLARGINE 100 UNIT/ML ~~LOC~~ SOLN
10.0000 [IU] | Freq: Every day | SUBCUTANEOUS | Status: DC
  Administered 2017-06-19 – 2017-06-20 (×3): 10 [IU] via SUBCUTANEOUS
  Filled 2017-06-19 (×6): qty 0.1

## 2017-06-19 MED ORDER — SODIUM CHLORIDE 0.9 % IV SOLN
INTRAVENOUS | Status: DC
  Administered 2017-06-19 – 2017-06-20 (×2): via INTRAVENOUS

## 2017-06-19 MED ORDER — INSULIN ASPART 100 UNIT/ML ~~LOC~~ SOLN
0.0000 [IU] | Freq: Three times a day (TID) | SUBCUTANEOUS | Status: DC
  Administered 2017-06-19: 5 [IU] via SUBCUTANEOUS
  Administered 2017-06-19: 2 [IU] via SUBCUTANEOUS
  Administered 2017-06-20 (×3): 5 [IU] via SUBCUTANEOUS
  Administered 2017-06-21 (×2): 3 [IU] via SUBCUTANEOUS

## 2017-06-19 MED ORDER — GLIPIZIDE 5 MG PO TABS
2.5000 mg | ORAL_TABLET | Freq: Every day | ORAL | Status: DC
  Administered 2017-06-20: 2.5 mg via ORAL
  Filled 2017-06-19: qty 1

## 2017-06-19 NOTE — ED Notes (Signed)
Pt ambulated to bathroom 

## 2017-06-19 NOTE — Progress Notes (Signed)
  RD consulted for nutrition education regarding diabetes. The patient has a hx of PVK, CAD, CKD-3, CVA and CABG. He presents with blood glucose of 1173 and was  A1C of 6.1% back on October of 2017.   Lab Results  Component Value Date   HGBA1C 6.1 (H) 02/20/2016    RD provided "Carbohydrate Counting for People with Diabetes" handout from the Academy of Nutrition and Dietetics.   Discussed different food groups and their effects on blood sugar, emphasizing carbohydrate-containing foods.   Provided list of carbohydrates and recommended serving sizes of common foods.  Discussed importance of controlled and consistent carbohydrate intake throughout the day. Provided examples of ways to balance meals/snacks and encouraged intake of high-fiber, whole grain complex carbohydrates. Teach back method used.  Expect good compliance. Patient was engaged and had several questions which were addressed. He walks three times weekly and plans to increase current activity level with MD approval.  Body mass index is 27.49 kg/m. Pt meets criteria for overweight based on current BMI.  Current diet order is CHO modifed, patient is consuming approximately 50% of meals at this time. Labs and medications reviewed. No further nutrition interventions warranted at this time. RD contact information provided. If additional nutrition issues arise, please re-consult RD.   Colman Cater MS,RD,CSG,LDN Office: [phone] Pager: [phone]

## 2017-06-19 NOTE — ED Notes (Signed)
Date and time results received: 06/19/17  0004 (use smartphrase ".now" to insert current time)  Test: Glucose  Critical Value: 832  Name of Provider Notified: Manuella Ghazi  Orders Received? Or Actions Taken?:

## 2017-06-19 NOTE — Progress Notes (Signed)
PROGRESS NOTE    Patient: [name]     PCP: Mikey Kirschner, MD                    DOB: 21-Aug-1940            DOA: 06/18/2017 WEX:[id number]             DOS: 06/19/2017, 11:02 AM   Date of Service: the patient was seen and examined on 06/19/2017 Subjective:   Patient was seen and examined this morning, stable in no acute distress, still n.p.o. Overnight, Blood sugar has been steadily improving overnight this morning 205 ----------------------------------------------------------------------------------------------------------------------  Brief Narrative:   [name] is a 77 y.o. male with multiple past medical history of peripheral vascular disease, coronary artery disease, prior history of CABG, CKD stage III, CVA/TIA prior right renal stent in 2017, hypertension, hyperlipidemia, insomnia, neuropathy presented in hyperglycemic ketoacidotic state.  History reported hemoglobin A1c was 6.1 on 2017. Patient blood sugar was 1173 with a potassium of 6.7 and anion gap of 32 elevated creatinine of 1.17, was admitted for diabetic ketoacidosis.  Principal Problem:   DKA (diabetic ketoacidoses) (HCC) Active Problems:   Abdominal aortic aneurysm (HCC)   PERIPHERAL VASCULAR DISEASE   Arteriosclerotic cardiovascular disease (ASCVD)   Cerebrovascular disease   Hypertension   Hyperlipidemia   Hyperkalemia   Assessment & Plan:   Diabetic ketoacidosis -, hemoglobin A1c 1.6 on 03/02/2016, repeated hemoglobin A1c pending Blood sugars has been steadily improving, has been on insulin drip, aggressive IV fluid hydration, this morning appears to 05, anion gap is closed DC insulin drip, initiate Lantus 10 units subcu twice daily, will initiate diet, sliding scale insulin Nutrition/diabetic education team consulted regarding new regimen Dissipating patient to be discharged on Lantus and Glucotrol (started at 2.5 mg anticipating to increase in dose once p.o. intake improves) Pending repeat  hemoglobin A1c  Hyper kalemia -secondary to above, much improved with IV fluid hydration, negative for EKG changes, not complaining of any chest pain.  Acute on chronic CKD stage III, spotted well to IV fluid hydration, BUN/creatinine at baseline We will try to avoid nephrotoxins therefore not prescribing metformin   Monitor on repeat lab work.  Infrarenal abdominal aortic aneurysm-, CTA 3-6 months, currently stable, history of right renal stent in 2017, currently asymptomatic continue to monitor Infrarenal abdominal aortic aneurysm -   Monitor on repeat CTA in 3-6 months.  Patient has history of right renal stent in 2007.  This is currently asymptomatic.   Hypertension -   Hold current home medications and will place on hydralazine as needed.  Dyslipidemia -  Hold current home medications until diet restarted.  Insomnia.  Continue home Restoril.   DVT prophylaxis: Lovenox Code Status: Full code Family Communication:  No family at bedside at this time Disposition Plan:  Home when stable Consults called:None Admission status: Inpatient,      Consultants:   None   Procedures:  No admission procedures for hospital encounter.   Antimicrobials:  Anti-infectives (From admission, onward)   None     *  Objective: Vitals:   06/19/17 0500 06/19/17 0600 06/19/17 0819 06/19/17 1100  BP: (!) 126/47 126/81    Pulse: 85 89 85 88  Resp: 17 (!) 27 13 (!) 21  Temp:   (!) 97.4 F (36.3 C) 97.9 F (36.6 C)  TempSrc:   Oral Oral  SpO2: 97% 97% 97% 96%  Weight:      Height:  Intake/Output Summary (Last 24 hours) at 06/19/2017 1102 Last data filed at 06/19/2017 0519 Gross per 24 hour  Intake 2048.42 ml  Output -  Net 2048.42 ml   Filed Weights   06/18/17 1935 06/19/17 0458  Weight: 98 kg (216 lb) 86.9 kg (191 lb 9.3 oz)    Examination:  General exam: Appears calm and comfortable  Respiratory system: Clear to auscultation. Respiratory effort  normal. Cardiovascular system: S1 & S2 heard, RRR. No JVD, murmurs, rubs, gallops or clicks. No pedal edema. Gastrointestinal system: Abdomen is nondistended, soft and nontender. No organomegaly or masses felt. Normal bowel sounds heard. Central nervous system: Alert and oriented. No focal neurological deficits. Extremities: Symmetric 5 x 5 power. Skin: No rashes, lesions or ulcers Psychiatry: Judgement and insight appear normal. Mood & affect appropriate.     Data Reviewed: I have personally reviewed following labs and imaging studies  CBC: Recent Labs  Lab 06/18/17 1938 06/19/17 0301  WBC 10.8* 14.0*  HGB 15.9 15.3  HCT 48.1 44.1  MCV 96.0 91.3  PLT 168 063   Basic Metabolic Panel: Recent Labs  Lab 06/18/17 1938 06/18/17 2309 06/19/17 0301 06/19/17 0723  NA 124* 132* 139 141  K 6.7* 5.3* 5.1 4.6  CL 89* 97* 105 106  CO2 20* 22 23 27   GLUCOSE 1,173* 832* 401* 202*  BUN 42* 40* 37* 33*  CREATININE 1.65* 1.43* 1.39* 1.22  CALCIUM 9.7 9.0 9.6 9.6   GFR: Estimated Creatinine Clearance: 51.5 mL/min (by C-G formula based on SCr of 1.22 mg/dL). Liver Function Tests: Recent Labs  Lab 06/18/17 2010  AST 21  ALT 25  ALKPHOS 141*  BILITOT 1.1  PROT 7.3  ALBUMIN 4.1   No results for input(s): LIPASE, AMYLASE in the last 168 hours. No results for input(s): AMMONIA in the last 168 hours. Coagulation Profile: No results for input(s): INR, PROTIME in the last 168 hours. Cardiac Enzymes: Recent Labs  Lab 06/18/17 2010  TROPONINI <0.03   BNP (last 3 results) No results for input(s): PROBNP in the last 8760 hours. HbA1C: No results for input(s): HGBA1C in the last 72 hours. CBG: Recent Labs  Lab 06/19/17 0517 06/19/17 0612 06/19/17 0720 06/19/17 0818 06/19/17 0929  GLUCAP 272* 236* 203* 204* 151*   Lipid Profile: No results for input(s): CHOL, HDL, LDLCALC, TRIG, CHOLHDL, LDLDIRECT in the last 72 hours. Thyroid Function Tests: No results for input(s): TSH,  T4TOTAL, FREET4, T3FREE, THYROIDAB in the last 72 hours. Anemia Panel: No results for input(s): VITAMINB12, FOLATE, FERRITIN, TIBC, IRON, RETICCTPCT in the last 72 hours. Sepsis Labs: No results for input(s): PROCALCITON, LATICACIDVEN in the last 168 hours.  Recent Results (from the past 240 hour(s))  MRSA PCR Screening     Status: None   Collection Time: 06/19/17  4:55 AM  Result Value Ref Range Status   MRSA by PCR NEGATIVE NEGATIVE Final    Comment:        The GeneXpert MRSA Assay (FDA approved for NASAL specimens only), is one component of a comprehensive MRSA colonization surveillance program. It is not intended to diagnose MRSA infection nor to guide or monitor treatment for MRSA infections. Performed at [facility], [address] [place], [place] [address]        Radiology Studies: Ct Head Wo Contrast  Result Date: 06/18/2017 CLINICAL DATA:  Syncope. EXAM: CT HEAD WITHOUT CONTRAST TECHNIQUE: Contiguous axial images were obtained from the base of the skull through the vertex without intravenous contrast. COMPARISON:  January 20, 2007 FINDINGS: Brain: No subdural, epidural, or subarachnoid hemorrhage. A prominent vein over the right convexity is stable since 2008. Cerebellum, brainstem, and basal cisterns are normal. Ventricles and sulci are stable. No mass effect or midline shift. No acute cortical ischemia or infarct. Vascular: Calcified atherosclerotic changes in the intracranial carotids. Skull: Normal. Negative for fracture or focal lesion. Sinuses/Orbits: No acute finding. Other: None. IMPRESSION: No acute intracranial abnormalities identified. Electronically Signed   By: Dorise Bullion III M.D   On: 06/18/2017 22:17   Ct Chest W Contrast  Result Date: 06/18/2017 CLINICAL DATA:  Syncopal episode, hit left flank left flank pain EXAM: CT CHEST, ABDOMEN, AND PELVIS WITH CONTRAST TECHNIQUE: Multidetector CT imaging of the chest, abdomen and pelvis was performed following  the standard protocol during bolus administration of intravenous contrast. CONTRAST:  67mL ISOVUE-300 IOPAMIDOL (ISOVUE-300) INJECTION 61% COMPARISON:  12/22/2014 CT abdomen pelvis FINDINGS: CT CHEST FINDINGS Cardiovascular: Nonaneurysmal aorta. Moderate aortic atherosclerosis. Post CABG changes. Coronary artery calcification. Normal heart size. No pericardial effusion. Mediastinum/Nodes: No evidence for mediastinal hematoma. Midline trachea. No thyroid mass. No significantly enlarged lymph nodes. The erative changes. No acute or suspicious lesion IMPRESSION: 1. No CT evidence for acute intra-abdominal, intrathoracic or intrapelvic abnormality. 2. Bilobed infrarenal abdominal aortic aneurysm measuring up to 5 cm in diameter. Recommend followup by abdomen and pelvis CTA in 3-6 months, and vascular surgery referral/consultation if not already obtained. This recommendation follows ACR consensus guidelines: White Paper of the ACR Incidental Findings Committee II on Vascular Findings. J Am Coll Radiol 2013; 10:789-794. 3. Sigmoid colon diverticular disease without acute inflammation. Electronically Signed   By: Donavan Foil M.D.   On: 06/18/2017 22:14   Ct Abdomen Pelvis W Contrast  Result Date: 06/18/2017 CLINICAL DATA:  Syncopal episode, hit left flank left flank pain EXAM: CT CHEST, ABDOMEN, AND PELVIS WITH CONTRAST TECHNIQUE: Multidetector CT imaging of the chest, abdomen and pelvis was per the left greater than right inguinal canal. No free air or free fluid. Musculoskeletal: Degenerative changes. No acute or suspicious lesion IMPRESSION: 1. No CT evidence for acute intra-abdominal, intrathoracic or intrapelvic abnormality. 2. Bilobed infrarenal abdominal aortic aneurysm measuring up to 5 cm in diameter. Recommend followup by abdomen and pelvis CTA in 3-6 months, and vascular surgery referral/consultation if not already obtained. This recommendation follows ACR consensus guidelines: White Paper of the ACR  Incidental Findings Committee II on Vascular Findings. J Am Coll Radiol 2013; 10:789-794. 3. Sigmoid colon diverticular disease without acute inflammation. Electronically Signed   By: Donavan Foil M.D.   On: 06/18/2017 22:14    Scheduled Meds: . ALPRAZolam  1 mg Oral QHS  . clopidogrel  75 mg Oral Daily  . enoxaparin (LOVENOX) injection  40 mg Subcutaneous Q24H  . glipiZIDE  2.5 mg Oral QAC breakfast  . insulin aspart  0-9 Units Subcutaneous TID WC  . insulin glargine  10 Units Subcutaneous QHS  . multivitamin with minerals  1 tablet Oral Daily  . nortriptyline  25 mg Oral QHS  . temazepam  30 mg Oral QHS   Continuous Infusions: . sodium chloride 125 mL/hr at 06/19/17 1000     LOS: 1 day    Time spent: >25 minutes   Deatra James, MD [facility] Pager [phone]  If 7PM-7AM, please contact night-coverage [email or web address] Password [email or web address] 06/19/2017, 11:02 AM

## 2017-06-19 NOTE — Progress Notes (Signed)
Inpatient Diabetes Program Recommendations  AACE/ADA: New Consensus Statement on Inpatient Glycemic Control (2015)  Target Ranges:  Prepandial:   less than 140 mg/dL      Peak postprandial:   less than 180 mg/dL (1-2 hours)      Critically ill patients:  140 - 180 mg/dL  Results for MALACKI, MCPHEARSON (MRN [id number]) as of 06/19/2017 07:30  Ref. Range 06/19/2017 02:03 06/19/2017 03:08 06/19/2017 04:05 06/19/2017 05:17 06/19/2017 06:12  Glucose-Capillary Latest Ref Range: 65 - 99 mg/dL 525 (HH) 409 (H) 345 (H) 272 (H) 236 (H)   Results for DEAVION, DOBBS (MRN [id number]) as of 06/19/2017 07:30  Ref. Range 06/18/2017 19:38 06/18/2017 23:09 06/19/2017 03:01  Glucose Latest Ref Range: 65 - 99 mg/dL 1,173 (HH) 832 (HH) 401 (H)   Results for DEREK, LAUGHTER (MRN [id number]) as of 06/19/2017 07:30  Ref. Range 06/18/2017 19:38 06/18/2017 20:10 06/18/2017 23:09 06/19/2017 03:01  CO2 Latest Ref Range: 22 - 32 mmol/L 20 (L)  22 23    Review of Glycemic Control  Current orders for Inpatient glycemic control: IV insulin drip  Inpatient Diabetes Program Recommendations: Insulin - Basal: At time of transition from IV to SQ insulin, please consider ordering Lantus 17 units Q24H (based on 86 kg x 0.2 units). Correction (SSI): At time of transition from IV to SQ insulin, please consider ordering CBGs with Novolog 0-15 units TID with meals and Novolog 0-5 units QHS. HgbA1C: A1C in process.  NOTE: Per H&P, patient has not prior dx of DM. Initial glucose 1173 mg/dl on 06/18/17. Patient is currently ordered IV insulin drip. Ordered: RD consult for diet education, patient education by bedside RNs, Living Well with Diabetes book. Diabetes Coordinator is not on campus today but will plan to follow up with patient on 06/20/17.   BEDSIDE RN:, Please use each patient interaction to provide diabetes education. Please review Living Well with Diabetes booklet with the patient, have patient watch patient education videos on diabetes, and instruct on  insulin administration. Please allow patient to be actively engaged with diabetes management by allowing patient to check own glucose and self-administer insulin injections. Diabetes Coordinator will follow up with patient and reinforce diabetes education.   Thanks, Barnie Alderman, RN, MSN, CDE Diabetes Coordinator Inpatient Diabetes Program [phone] (Team Pager from 8am to 5pm)

## 2017-06-19 NOTE — ED Notes (Signed)
CRITICAL VALUE ALERT  Critical Value:  Glucose 832  Date & Time Notied:  06/19/17 at [place]  Provider Notified: Manuella Ghazi MD  Orders Received/Actions taken: No orders at this time

## 2017-06-20 LAB — HEMOGLOBIN A1C
Hgb A1c MFr Bld: 11.2 % — ABNORMAL HIGH (ref 4.8–5.6)
Mean Plasma Glucose: 275 mg/dL

## 2017-06-20 LAB — BASIC METABOLIC PANEL
Anion gap: 8 (ref 5–15)
BUN: 28 mg/dL — ABNORMAL HIGH (ref 6–20)
CO2: 21 mmol/L — ABNORMAL LOW (ref 22–32)
Calcium: 8.4 mg/dL — ABNORMAL LOW (ref 8.9–10.3)
Chloride: 105 mmol/L (ref 101–111)
Creatinine, Ser: 1.04 mg/dL (ref 0.61–1.24)
GFR calc Af Amer: >60 mL/min (ref >60)
GFR calc non Af Amer: >60 mL/min (ref >60)
Glucose, Bld: 317 mg/dL — ABNORMAL HIGH (ref 65–99)
Potassium: 4.4 mmol/L (ref 3.5–5.1)
Sodium: 134 mmol/L — ABNORMAL LOW (ref 135–145)

## 2017-06-20 LAB — GLUCOSE, CAPILLARY
Glucose-Capillary: 281 mg/dL — ABNORMAL HIGH (ref 65–99)
Glucose-Capillary: 287 mg/dL — ABNORMAL HIGH (ref 65–99)
Glucose-Capillary: 254 mg/dL — ABNORMAL HIGH (ref 65–99)
Glucose-Capillary: 265 mg/dL — ABNORMAL HIGH (ref 65–99)

## 2017-06-20 MED ORDER — GLIPIZIDE 5 MG PO TABS: 5.0000 mg | ORAL_TABLET | Freq: Every day | ORAL | Status: DC

## 2017-06-20 MED ORDER — INSULIN STARTER KIT- PEN NEEDLES (ENGLISH)
1.0000 | Freq: Once | Status: AC
  Administered 2017-06-20: 1
  Filled 2017-06-20: qty 1

## 2017-06-20 MED ORDER — GLIPIZIDE 5 MG PO TABS
2.5000 mg | ORAL_TABLET | Freq: Every day | ORAL | Status: DC
  Administered 2017-06-21: 2.5 mg via ORAL
  Filled 2017-06-20: qty 1

## 2017-06-20 NOTE — Care Management Note (Addendum)
Case Management Note  Patient Details  Name: [name] MRN: [id number] Date of Birth: [birth year]  Subjective/Objective:  DKA, new DM diagnosis. From home with wife. Patient has met with diabetes coordinator. Has talked with [facility] liason, will be enrolled for  [facility] case management calls and home visits. Patient has insurance. On going teaching provided by bedside nurses.          Action/Plan: DC home with [facility] services. CM will follow for future needs.  Discussed HHRN for additional DM teaching at home. Patient agreeable. Offered choice. Vaughan Basta of [facility] notified and will obtain orders from [place].   Expected Discharge Date:       06/20/2017           Expected Discharge Plan:  Home/Self Care  In-House Referral:     Discharge planning Services  CM Consult  Post Acute Care Choice:  NA Choice offered to:  NA  DME Arranged:    DME Agency:     HH Arranged:   RN for disease management HH Agency:   [facility]  Status of Service:  Completed If discussed at [place] of Stay Meetings, dates discussed:    Additional Comments:  Ligia Duguay, Chauncey [place], RN 06/20/2017, 3:06 PM

## 2017-06-20 NOTE — Progress Notes (Addendum)
Inpatient Diabetes Program Recommendations  AACE/ADA: New Consensus Statement on Inpatient Glycemic Control (2015)  Target Ranges:  Prepandial:   less than 140 mg/dL      Peak postprandial:   less than 180 mg/dL (1-2 hours)      Critically ill patients:  140 - 180 mg/dL   Results for UMER, HARIG (MRN [id number]) as of 06/20/2017 07:50  Ref. Range 06/19/2017 23:06 06/20/2017 03:25  Glucose Latest Ref Range: 65 - 99 mg/dL 351 (H) 317 (H)   Results for DAISUKE, BAILEY (MRN [id number]) as of 06/20/2017 07:50  Ref. Range 06/19/2017 07:20 06/19/2017 08:18 06/19/2017 09:29 06/19/2017 10:52 06/19/2017 11:40 06/19/2017 12:35 06/19/2017 16:27 06/19/2017 21:19  Glucose-Capillary Latest Ref Range: 65 - 99 mg/dL 203 (H) 204 (H) 151 (H) 146 (H) 158 (H) 181 (H) 267 (H) 327 (H)   Results for OTHEL, HOOGENDOORN (MRN [id number]) as of 06/20/2017 07:50  Ref. Range 02/20/2016 10:14 06/18/2017 19:38  Hemoglobin A1C Latest Ref Range: 4.8 - 5.6 % 6.1 (H) 11.2 (H)   Review of Glycemic Control  Current orders for Inpatient glycemic control: Lantus 10 units QHS, Novolog 0-9 units TID with meals, Glipizide 2.5 mg QAM  Inpatient Diabetes Program Recommendations: Insulin - Basal: Noted patient received Lantus 10 units at 12:21 and Lantus 10 units at 22:03 on 06/19/17. Please consider increasing Lantus to 26 units QHS (based on 82 kg x 0.3 units). Correction (SSI): Please consider adding Novolog 0-5 units QHS for bedtime correction scale. Oral Agents: Noted Glipizide 2.5 mg QAM was ordered on 06/19/17 to start 06/19/17 but per [facility], it was NOT GIVEN yesterday. HgbA1C: A1C 11.2% on 06/18/17 indicating an average glucose of 275 mg/dl over the past 2-3 months.  NOTE: Will plan to talk with patient today.  06/21/17-Spoke with patient about new diabetes diagnosis.  Patient states that he has never been told he had DM or pre-DM in the past. Discussed A1C results (11.2% on 06/18/17) and explained what an A1C is and informed patient that his current A1C  indicates an average glucose of 275 mg/dl over the past 2-3 months. Discussed basic pathophysiology of DM Type 2, basic home care, importance of checking CBGs and maintaining good CBG control to prevent long-term and short-term complications. Reviewed glucose and A1C goals and explained that patient will need to continue to  Reviewed signs and symptoms of hyperglycemia and hypoglycemia along with treatment for both. Patient reports that he has been having symptoms (excessive thirst and urination, weakness, blurry vision) of hyperglycemia for about 1 week.  Discussed impact of nutrition, exercise, stress, sickness, and medications on diabetes control. Reviewed Living Well with diabetes booklet and encouraged patient to read through entire book. Informed patient that he will be prescribed insulin to take as an outpatient. Discussed various treatments for DM2 and explained that MD plans to discharge patient on insulin and oral DM medication. Discussed how Glipizide works for DM control. Discussed Lantus insulin in detail. Discussed insulin administration with vial/syringe versus insulin pens. Asked patient to check his glucose 4 times per day (before meals and at bedtime) and to keep a log book of glucose readings and insulin taken. Explained how the doctor he follows up with can use the log book to continue to make insulin adjustments if needed. Reviewed and demonstrated how to draw up and administer insulin with vial and syringe and how to use an insulin pen and patient reports that he would prefer insulin pens.  Patient was able to successfully demonstrate how  to use vial/sringe and insulin pens for insulin injections. Informed patient that RN will be asking him to self-administer insulin to ensure proper technique and ability to administer self insulin shots.   Patient verbalized understanding of information discussed and he states that he has no further questions at this time related to diabetes.   RNs to provide  ongoing basic DM education at bedside with this patient and engage patient to actively check blood glucose and administer insulin injections.   Thanks, Barnie Alderman, RN, MSN, CDE Diabetes Coordinator Inpatient Diabetes Program [phone] (Team Pager from 8am to 5pm)

## 2017-06-20 NOTE — Progress Notes (Signed)
PROGRESS NOTE    Patient: [name]     PCP: Mikey Kirschner, MD                    DOB: 1940/12/20            DOA: 06/18/2017 IEP:[id number]             DOS: 06/20/2017, 9:38 AM   Date of Service: the patient was seen and examined on 06/20/2017 Subjective:    Patient was seen and examined this morning, stable no acute distress no issues overnight. Blood sugars still running high last 3 sugars 267, 327, 281  ----------------------------------------------------------------------------------------------------------------------  Brief Narrative:   [name] is a 77 y.o. male with multiple past medical history of peripheral vascular disease, coronary artery disease, prior history of CABG, CKD stage III, CVA/TIA prior right renal stent in 2017, hypertension, hyperlipidemia, insomnia, neuropathy presented in hyperglycemic ketoacidotic state.  History reported hemoglobin A1c was 6.1 on 2017. Patient blood sugar was 1173 with a potassium of 6.7 and anion gap of 32 elevated creatinine of 1.17, was admitted for diabetic ketoacidosis.  Principal Problem:   DKA (diabetic ketoacidoses) (HCC) Active Problems:   Abdominal aortic aneurysm (HCC)   PERIPHERAL VASCULAR DISEASE   Arteriosclerotic cardiovascular disease (ASCVD)   Cerebrovascular disease   Hypertension   Hyperlipidemia   Hyperkalemia   Assessment & Plan:   Diabetic ketoacidosis -, hemoglobin A1c 1.6 on 03/02/2016, repeated hemoglobin A1c pending Blood sugars has been steadily improving, has been on insulin drip, aggressive IV fluid hydration, this morning appears to 05, anion gap is closed DC insulin drip, initiate Lantus 10 units subcu twice daily, will initiate diet, sliding scale insulin Nutrition/diabetic education team following,  Based on the last sugars of the past 12 hours will increase Glucotrol from 2.5-5 mg this morning.  Continue Lantus at 10 units nightly  Hyperkalemia -secondary to above,  resolved  Acute on chronic CKD stage III, responded well to IV fluid hydration, BUN/creatinine at baseline We will try to avoid nephrotoxins therefore not prescribing metformin  Infrarenal abdominal aortic aneurysm-, CTA 3-6 months, currently stable, history of right renal stent in 2017, currently asymptomatic continue to monitor Infrarenal abdominal aortic aneurysm -   Monitor on repeat CTA in 3-6 months.  Patient has history of right renal stent in 2007.  This is currently asymptomatic.   Hypertension -   BP stable this Am.  Hold current home medications and will place on hydralazine as needed.  Dyslipidemia -resume Statin   Insomnia.  Continue home Restoril.   DVT prophylaxis: Lovenox Code Status: Full code Family Communication:  No family at bedside at this time Disposition Plan:  Home when in AM.  Consults called:None Admission status: Inpatient,      Consultants:   None   Procedures:  No admission procedures for hospital encounter.   Antimicrobials:  Anti-infectives (From admission, onward)   None     *  Objective: Vitals:   06/20/17 0400 06/20/17 0500 06/20/17 0600 06/20/17 0759  BP:  125/79 137/77   Pulse:   79   Resp:      Temp: 97.9 F (36.6 C)   97.9 F (36.6 C)  TempSrc: Oral     SpO2:   95%   Weight:  90.4 kg (199 lb 4.7 oz)    Height:        Intake/Output Summary (Last 24 hours) at 06/20/2017 6606 Last data filed at 06/20/2017 0000 Gross per 24  hour  Intake 1870 ml  Output -  Net 1870 ml   Filed Weights   06/18/17 1935 06/19/17 0458 06/20/17 0500  Weight: 98 kg (216 lb) 86.9 kg (191 lb 9.3 oz) 90.4 kg (199 lb 4.7 oz)    Examination: BP 137/77   Pulse 79   Temp 97.9 F (36.6 C)   Resp 19   Ht 5' 10"  (1.778 m)   Wt 90.4 kg (199 lb 4.7 oz)   SpO2 95%   BMI 28.60 kg/m    Physical Exam  Constitution:  Alert, cooperative, no distress, appears stated age HEENT: Normocephalic, PERRL, otherwise with in Normal limits  Vascular:   S1/S2, RRR, No murmure, No Rubs or Gallops  Chest/pulmonary: Clear to auscultation bilaterally, respirations unlabored  Chest symmetric Abdomen: Soft, non-tender, non-distended, bowel sounds,no masses, no organomegaly Muscular skeletal: Limited exam - in bed, able to move all 4 extremities, Normal strength,  Extremities: No pitting edema lower extremities, +2 pulses  Neuro: CNII-XII intact. , normal motor and sensation, reflexes intact  Skin: Dry, warm to touch, negative for any Rashes, No open wounds Psychiatric: Normal and stable mood and affect, cognition intact,       Data Reviewed: I have personally reviewed following labs and imaging studies  CBC: Recent Labs  Lab 06/18/17 1938 06/19/17 0301  WBC 10.8* 14.0*  HGB 15.9 15.3  HCT 48.1 44.1  MCV 96.0 91.3  PLT 168 865   Basic Metabolic Panel: Recent Labs  Lab 06/19/17 1113 06/19/17 1520 06/19/17 1946 06/19/17 2306 06/20/17 0325  NA 140 137 134* 133* 134*  K 4.3 4.1 4.7 4.6 4.4  CL 104 102 102 105 105  CO2 26 23 21* 18* 21*  GLUCOSE 160* 231* 411* 351* 317*  BUN 31* 31* 33* 31* 28*  CREATININE 1.12 1.15 1.14 1.07 1.04  CALCIUM 9.6 9.4 9.0 8.7* 8.4*   GFR: Estimated Creatinine Clearance: 66.2 mL/min (by C-G formula based on SCr of 1.04 mg/dL). Liver Function Tests: Recent Labs  Lab 06/18/17 2010  AST 21  ALT 25  ALKPHOS 141*  BILITOT 1.1  PROT 7.3  ALBUMIN 4.1   No results for input(s): LIPASE, AMYLASE in the last 168 hours. No results for input(s): AMMONIA in the last 168 hours. Coagulation Profile: No results for input(s): INR, PROTIME in the last 168 hours. Cardiac Enzymes: Recent Labs  Lab 06/18/17 2010  TROPONINI <0.03   BNP (last 3 results) No results for input(s): PROBNP in the last 8760 hours. HbA1C: Recent Labs    06/18/17 1938  HGBA1C 11.2*   CBG: Recent Labs  Lab 06/19/17 1140 06/19/17 1235 06/19/17 1627 06/19/17 2119 06/20/17 0751  GLUCAP 158* 181* 267* 327* 281*   Lipid  Profile: No results for input(s): CHOL, HDL, LDLCALC, TRIG, CHOLHDL, LDLDIRECT in the last 72 hours. Thyroid Function Tests: No results for input(s): TSH, T4TOTAL, FREET4, T3FREE, THYROIDAB in the last 72 hours. Anemia Panel: No results for input(s): VITAMINB12, FOLATE, FERRITIN, TIBC, IRON, RETICCTPCT in the last 72 hours. Sepsis Labs: No results for input(s): PROCALCITON, LATICACIDVEN in the last 168 hours.  Recent Results (from the past 240 hour(s))  MRSA PCR Screening     Status: None   Collection Time: 06/19/17  4:55 AM  Result Value Ref Range Status   MRSA by PCR NEGATIVE NEGATIVE Final    Comment:        The GeneXpert MRSA Assay (FDA approved for NASAL specimens only), is one component of a comprehensive MRSA colonization  surveillance program. It is not intended to diagnose MRSA infection nor to guide or monitor treatment for MRSA infections. Performed at [facility], [address] [place], [place] [address]        Radiology Studies: Ct Head Wo Contrast  Result Date: 06/18/2017 CLINICAL DATA:  Syncope. EXAM: CT HEAD WITHOUT CONTRAST TECHNIQUE: Contiguous axial images were obtained from the base of the skull through the vertex without intravenous contrast. COMPARISON:  January 20, 2007 FINDINGS: Brain: No subdural, epidural, or subarachnoid hemorrhage. A prominent vein over the right convexity is stable since 2008. Cerebellum, brainstem, and basal cisterns are normal. Ventricles and sulci are stable. No mass effect or midline shift. No acute cortical ischemia or infarct. Vascular: Calcified atherosclerotic changes in the intracranial carotids. Skull: Normal. Negative for fracture or focal lesion. Sinuses/Orbits: No acute finding. Other: None. IMPRESSION: No acute intracranial abnormalities identified. Electronically Signed   By: Dorise Bullion III M.D   On: 06/18/2017 22:17   Ct Chest W Contrast  Result Date: 06/18/2017 CLINICAL DATA:  Syncopal episode, hit left flank  left flank pain EXAM: CT CHEST, ABDOMEN, AND PELVIS WITH CONTRAST TECHNIQUE: Multidetector CT imaging of the chest, abdomen and pelvis was performed following the standard protocol during bolus administration of intravenous contrast. CONTRAST:  69m ISOVUE-300 IOPAMIDOL (ISOVUE-300) INJECTION 61% COMPARISON:  12/22/2014 CT abdomen pelvis FINDINGS: CT CHEST FINDINGS Cardiovascular: Nonaneurysmal aorta. Moderate aortic atherosclerosis. Post CABG changes. Coronary artery calcification. Normal heart size. No pericardial effusion. Mediastinum/Nodes: No evidence for mediastinal hematoma. Midline trachea. No thyroid mass. No significantly enlarged lymph nodes. The erative changes. No acute or suspicious lesion IMPRESSION: 1. No CT evidence for acute intra-abdominal, intrathoracic or intrapelvic abnormality. 2. Bilobed infrarenal abdominal aortic aneurysm measuring up to 5 cm in diameter. Recommend followup by abdomen and pelvis CTA in 3-6 months, and vascular surgery referral/consultation if not already obtained. This recommendation follows ACR consensus guidelines: White Paper of the ACR Incidental Findings Committee II on Vascular Findings. J Am Coll Radiol 2013; 10:789-794. 3. Sigmoid colon diverticular disease without acute inflammation. Electronically Signed   By: KDonavan FoilM.D.   On: 06/18/2017 22:14   Ct Abdomen Pelvis W Contrast  Result Date: 06/18/2017 CLINICAL DATA:  Syncopal episode, hit left flank left flank pain EXAM: CT CHEST, ABDOMEN, AND PELVIS WITH CONTRAST TECHNIQUE: Multidetector CT imaging of the chest, abdomen and pelvis was per the left greater than right inguinal canal. No free air or free fluid. Musculoskeletal: Degenerative changes. No acute or suspicious lesion IMPRESSION: 1. No CT evidence for acute intra-abdominal, intrathoracic or intrapelvic abnormality. 2. Bilobed infrarenal abdominal aortic aneurysm measuring up to 5 cm in diameter. Recommend followup by abdomen and pelvis CTA in 3-6  months, and vascular surgery referral/consultation if not already obtained. This recommendation follows ACR consensus guidelines: White Paper of the ACR Incidental Findings Committee II on Vascular Findings. J Am Coll Radiol 2013; 10:789-794. 3. Sigmoid colon diverticular disease without acute inflammation. Electronically Signed   By: KDonavan FoilM.D.   On: 06/18/2017 22:14    Scheduled Meds: . ALPRAZolam  1 mg Oral QHS  . clopidogrel  75 mg Oral Daily  . enoxaparin (LOVENOX) injection  40 mg Subcutaneous Q24H  . glipiZIDE  5 mg Oral QAC breakfast  . insulin aspart  0-9 Units Subcutaneous TID WC  . insulin glargine  10 Units Subcutaneous QHS  . insulin starter kit- pen needles  1 kit Other Once  . multivitamin with minerals  1 tablet Oral Daily  .  nortriptyline  25 mg Oral QHS  . temazepam  30 mg Oral QHS   Continuous Infusions: . sodium chloride 125 mL/hr at 06/20/17 0254     LOS: 2 days    Time spent: >25 minutes   Deatra James, MD [facility] Pager [phone]  If 7PM-7AM, please contact night-coverage [email or web address] Password [facility] 06/20/2017, 9:38 AM

## 2017-06-20 NOTE — Consult Note (Signed)
   [facility] CM Inpatient Consult   06/20/2017  DIANA ARMIJO [birth year] [id number]   Referral received by inpatient diabetic coordinator for [place] Management services and post hospital discharge follow up related to a new diagnosis of Diabetes. Spoke with in patient diabetic coordinator who felt patient would benefit from Community case management vs telephonic related to the patient being a new diabetic and recently learning to give himself insulin.  Patient was evaluated for community based chronic disease management services with [facility] care Management Program as a benefit of patient's Next Gen Medicare. Called into patient's room and spoke with both patient and spouse to explain [place] Management services.  Verbal consent recieved. Patient gave [phone] as the best number to reach him. He also gave verbal  permission to speak with his spouse Lelan Pons. Patient will receive post hospital discharge calls and be evaluated for monthly home visits. [place] Management services does not interfere with or replace any services arranged by the inpatient care management team.  Made inpatient RNCM aware that [facility] will be following for care management. For additional questions please contact:   Ethan Kasperski RN, [place] [facility] Liaison  [phone]) Business Mobile [phone]) Toll free office

## 2017-06-21 ENCOUNTER — Telehealth: Payer: Self-pay | Admitting: *Deleted

## 2017-06-21 ENCOUNTER — Telehealth: Payer: Self-pay | Admitting: Family Medicine

## 2017-06-21 DIAGNOSIS — I10 Essential (primary) hypertension: Secondary | ICD-10-CM

## 2017-06-21 DIAGNOSIS — N183 Chronic kidney disease, stage 3 (moderate): Secondary | ICD-10-CM

## 2017-06-21 DIAGNOSIS — I714 Abdominal aortic aneurysm, without rupture: Secondary | ICD-10-CM

## 2017-06-21 DIAGNOSIS — N179 Acute kidney failure, unspecified: Secondary | ICD-10-CM

## 2017-06-21 DIAGNOSIS — E1110 Type 2 diabetes mellitus with ketoacidosis without coma: Principal | ICD-10-CM

## 2017-06-21 DIAGNOSIS — E785 Hyperlipidemia, unspecified: Secondary | ICD-10-CM

## 2017-06-21 LAB — GLUCOSE, CAPILLARY
Glucose-Capillary: 234 mg/dL — ABNORMAL HIGH (ref 65–99)
Glucose-Capillary: 237 mg/dL — ABNORMAL HIGH (ref 65–99)

## 2017-06-21 LAB — BASIC METABOLIC PANEL
Anion gap: 8 (ref 5–15)
BUN: 25 mg/dL — ABNORMAL HIGH (ref 6–20)
CO2: 23 mmol/L (ref 22–32)
Calcium: 8.6 mg/dL — ABNORMAL LOW (ref 8.9–10.3)
Chloride: 99 mmol/L — ABNORMAL LOW (ref 101–111)
Creatinine, Ser: 1.07 mg/dL (ref 0.61–1.24)
GFR calc Af Amer: >60 mL/min (ref >60)
GFR calc non Af Amer: >60 mL/min (ref >60)
Glucose, Bld: 311 mg/dL — ABNORMAL HIGH (ref 65–99)
Potassium: 3.9 mmol/L (ref 3.5–5.1)
Sodium: 130 mmol/L — ABNORMAL LOW (ref 135–145)

## 2017-06-21 MED ORDER — BLOOD GLUCOSE MONITOR KIT: PACK | 0 refills | Status: DC

## 2017-06-21 MED ORDER — PEN NEEDLES 31G X 8 MM MISC: 3 refills | Status: DC

## 2017-06-21 MED ORDER — INSULIN GLARGINE 100 UNIT/ML ~~LOC~~ SOLN
22.0000 [IU] | Freq: Every day | SUBCUTANEOUS | Status: DC
  Filled 2017-06-21 (×3): qty 0.22

## 2017-06-21 MED ORDER — INSULIN GLARGINE 100 UNITS/ML SOLOSTAR PEN: 22.0000 [IU] | PEN_INJECTOR | Freq: Every day | SUBCUTANEOUS | 3 refills | Status: DC

## 2017-06-21 MED ORDER — INSULIN ASPART 100 UNIT/ML FLEXPEN: 6.0000 [IU] | PEN_INJECTOR | Freq: Three times a day (TID) | SUBCUTANEOUS | 3 refills | Status: DC

## 2017-06-21 NOTE — Patient Outreach (Signed)
[facility]) Care Management  06/21/2017  WILLAIM MODE 11-08-1940 [id number]   Transition of care/care coordination  [facility] ([place]) [facility] ([place]) was notified by [facility] CMA that a referral was received from [place] hospital liaison on 07/01/17 -  Please call Mr. Lieurance,  According to Epic he is still in the hospital [facility] / [phone] / [id number]  Please see Janci's message below. Thank you   Please assign patient for community nurse to engage for transition of care calls and evaluate for monthly home visits. New diabetic with insulin ordered. For questions please contact:   With review of Epic notes CM found  Admission on 06/18/17 discharge summary for 07/01/17 for dx DKA (diabetic ketoacidosis), acute on chronic stage III renal failure in the setting of dehydration from DKA (keep well hydrated) abdominal aortic aneurysm, PVD, ASCVD, HTN, HDL, Hyperkalemia, Cerebrovascular disease Left with Lantus 22 units qd and novolog rapid insulin 6  Units tid with meals F/u with Primary care provider  in 10 days, diet- heart healthy and modified carbohydrates diet- d/c wt 201 lbs HgA1c on 06/18/17 was 11.2 (02/20/16 was 6.1) ht 5'10"  Plans CM will make another call attempt in 1-3 business days  [facility] CM left a HIPPA compliant voice message for  including [facility] CM mobile number for a return call.   Route note to care team MDs listed in [place]. Lavina Hamman, RN, BSN, [place] Coordinator [phone] week day mobile

## 2017-06-21 NOTE — Care Management Important Message (Signed)
Important Message  Patient Details  Name: [name] MRN: [id number] Date of Birth: 03-11-1941   Medicare Important Message Given:  Yes Given at [place], Chauncey Reading, RN 06/21/2017, 1:35 PM

## 2017-06-21 NOTE — Progress Notes (Signed)
Patient discharged to home take to care via wheelchair.  Discharge instruction given to wife and questions answered.

## 2017-06-21 NOTE — Discharge Summary (Signed)
Physician Discharge Summary  [name] JGG:[id number] DOB: August 25, 1940 DOA: 06/18/2017  PCP: Mikey Kirschner, MD  Admit date: 06/18/2017 Discharge date: 06/21/2017  Time spent: 35 minutes  Recommendations for Outpatient Follow-up:  1. Repeat BMET to follow electrolytes and renal function  2. Close follow up wot CBG and diabetes; further adjust hypoglycemic regimen as needed.  Discharge Diagnoses:  Principal Problem:   DKA (diabetic ketoacidoses) ([place]) Active Problems:   Abdominal aortic aneurysm (HCC)   PERIPHERAL VASCULAR DISEASE   Arteriosclerotic cardiovascular disease (ASCVD)   Cerebrovascular disease   Hypertension   Hyperlipidemia   Hyperkalemia   Discharge Condition: stable and improved. Discharge home with instructions to follow up with PCP in 10 days.  Diet recommendation: heart healthy and modified carbohydrates diet   Filed Weights   06/19/17 0458 06/20/17 0500 06/21/17 0500  Weight: 86.9 kg (191 lb 9.3 oz) 90.4 kg (199 lb 4.7 oz) 91.3 kg (201 lb 4.5 oz)    History of present illness:  As per H&P written by Dr. Manuella Ghazi on 06/18/17 77 y.o. male with medical history significant for peripheral vascular disease, CAD with prior CABG, CKD III, cerebrovascular disease with prior TIA, prior right renal stent in 2007, hypertension, dyslipidemia, insomnia, neuropathy, and what appears to be prediabetes with prior A1c of 6.1% on 02/2016.  He is not currently on any medications for blood glucose control.  He presented to the emergency department today with worsening thirst as well as some associated weakness and nausea that began yesterday.  He states that he has been drinking plenty of juices and eating ice cream with worsening symptomatology noted.  He denies any abdominal pain, vomiting, diarrhea, fever, chills, or upper respiratory symptomatology.  He states that he did fall earlier this morning to his left side on account of the weakness and landed on his left chest which is  bothering him.  He denies any shortness of breath or respiratory distress.  No confusion or loss of consciousness noted.  Hospital Course:  1-type 2 diabetes ketoacidosis:  -DKA resolved at discharge -A1c 11.2 -Patient will be discharged home with initiation of Lantus 22 units daily and NovoLog rapid insulin 6 units 3 times a day with meals. -Patient instructed to follow modified carbohydrate diets and to keep himself well-hydrated -Will need close follow-up with PCP for further adjustment on his hypoglycemic regimen.  2-acute on chronic stage III renal failure -In the setting of dehydration from DKA and continue use of ACE inhibitors. -Condition resolved him back to his baseline after fluid resuscitation. -Okay to resume ACE inhibitors at discharge -Patient instructed to keep himself well-hydrated -Will recommend repeat basic metabolic panel at his follow-up visit to reassess renal function trend and electrolytes.  3-Infrarenal abdominal aortic aneurysm: CTA 3-6 months, currently stable, history of right renal stent in 2017, currently asymptomatic continue to monitor  4-hypertension -Blood pressure stable -Resume home antihypertensive regimen -Patient advised to follow heart healthy diet.  5-HLD: -continue statins   6-insomnia -continue restoril   7-depression/anxiety -continue pamelor and PRN alprazolam   8-CAD -no CP or SOB -will continue plavix -continue ACE inhibitors and statins   Procedures:  See below for x-ray reports  Consultations:  None  Discharge Exam: Vitals:   06/21/17 0800 06/21/17 0900  BP: (!) 122/93 122/71  Pulse:    Resp:  15  Temp: 98.2 F (36.8 C)   SpO2: 98%     General: Afebrile, no chest pain, no shortness of breath, no nausea, no vomiting. Cardiovascular:  S1 and S2, no rubs, no gallops, no JVD. Respiratory: Good air movement bilaterally, no wheezing, no crackles. Abdomen: Soft, nontender, nondistended, positive bowel  sounds. Extremities: No edema, no cyanosis.  Discharge Instructions   Discharge Instructions    AMB Referral to [place] [facility]   Complete by:  As directed    Please assign patient for community nurse to engage for transition of care calls and evaluate for monthly home visits. New diabetic with insulin ordered.  For questions please contact:   Janci Minor RN, [facility] Liaison [phone])   Reason for consult:  Post hospital discharge follow up with [facility] Manager   Diagnoses of:  Diabetes   Expected date of contact:  1-3 days (reserved for hospital discharges)   Diet - low sodium heart healthy   Complete by:  As directed    Diet Carb Modified   Complete by:  As directed    Discharge instructions   Complete by:  As directed    Take medications as prescribed  Please keep yourself well hydrated  Follow up with PCP in 10 days Check you blood sugar at the very minimum twice a day  Do not skip meals     Allergies as of 06/21/2017      Reactions   Lipitor [atorvastatin] Other (See Comments)   Side effects-muscle aches      Medication List    TAKE these medications   ALPRAZolam 1 MG tablet Commonly known as:  XANAX take 1 tablet by mouth at bedtime if needed What changed:    how much to take  how to take this  when to take this  additional instructions   blood glucose meter kit and supplies Kit Dispense based on patient and insurance preference. Use up to four times daily to check your blood sugar (ICD-9 250.00, 250.01).   clopidogrel 75 MG tablet Commonly known as:  PLAVIX Take 1 tablet (75 mg total) by mouth daily.   Fish Oil 1000 MG Caps Take 1 capsule by mouth daily.   hydrochlorothiazide 25 MG tablet Commonly known as:  HYDRODIURIL take 1 tablet by mouth once daily   insulin aspart 100 UNIT/ML FlexPen Commonly known as:  NOVOLOG Inject 6 Units into the skin 3 (three) times daily with meals.   insulin glargine 100 unit/mL  Sopn Commonly known as:  LANTUS Inject 0.22 mLs (22 Units total) into the skin daily.   lisinopril 20 MG tablet Commonly known as:  PRINIVIL,ZESTRIL Take 1 tablet (20 mg total) by mouth 2 (two) times daily.   multivitamin tablet Take 1 tablet by mouth daily.   nortriptyline 25 MG capsule Commonly known as:  PAMELOR Take 1 capsule (25 mg total) by mouth at bedtime.   Pen Needles 31G X 8 MM Misc Use un pen needle each time you are going to inject insulin as instructed   simvastatin 80 MG tablet Commonly known as:  ZOCOR Take 1 tablet (80 mg total) by mouth daily.   temazepam 30 MG capsule Commonly known as:  RESTORIL Take 1 capsule (30 mg total) by mouth at bedtime.      Allergies  Allergen Reactions  . Lipitor [Atorvastatin] Other (See Comments)    Side effects-muscle aches   Follow-up Information    Mikey Kirschner, MD. Schedule an appointment as soon as possible for a visit in 10 day(s).   Specialty:  Family Medicine Contact information: [address] [place] [address] [phone]  The results of significant diagnostics from this hospitalization (including imaging, microbiology, ancillary and laboratory) are listed below for reference.    Significant Diagnostic Studies: Ct Head Wo Contrast  Result Date: 06/18/2017 CLINICAL DATA:  Syncope. EXAM: CT HEAD WITHOUT CONTRAST TECHNIQUE: Contiguous axial images were obtained from the base of the skull through the vertex without intravenous contrast. COMPARISON:  January 20, 2007 FINDINGS: Brain: No subdural, epidural, or subarachnoid hemorrhage. A prominent vein over the right convexity is stable since 2008. Cerebellum, brainstem, and basal cisterns are normal. Ventricles and sulci are stable. No mass effect or midline shift. No acute cortical ischemia or infarct. Vascular: Calcified atherosclerotic changes in the intracranial carotids. Skull: Normal. Negative for fracture or focal lesion.  Sinuses/Orbits: No acute finding. Other: None. IMPRESSION: No acute intracranial abnormalities identified. Electronically Signed   By: Dorise Bullion III M.D   On: 06/18/2017 22:17   Ct Chest W Contrast  Result Date: 06/18/2017 CLINICAL DATA:  Syncopal episode, hit left flank left flank pain EXAM: CT CHEST, ABDOMEN, AND PELVIS WITH CONTRAST TECHNIQUE: Multidetector CT imaging of the chest, abdomen and pelvis was performed following the standard protocol during bolus administration of intravenous contrast. CONTRAST:  72m ISOVUE-300 IOPAMIDOL (ISOVUE-300) INJECTION 61% COMPARISON:  12/22/2014 CT abdomen pelvis FINDINGS: CT CHEST FINDINGS Cardiovascular: Nonaneurysmal aorta. Moderate aortic atherosclerosis. Post CABG changes. Coronary artery calcification. Normal heart size. No pericardial effusion. Mediastinum/Nodes: No evidence for mediastinal hematoma. Midline trachea. No thyroid mass. No significantly enlarged lymph nodes. The esophagus is within normal limits. Lungs/Pleura: Lungs are clear. No pleural effusion or pneumothorax. Musculoskeletal: Post sternotomy. No acute or suspicious lesion. Partially visualized hardware in the cervical spine. Degenerative changes. CT ABDOMEN PELVIS FINDINGS Hepatobiliary: Multiple cysts in the liver. No calcified gallstones or biliary dilatation. Small vascular calcification at the porta hepatis. Pancreas: Unremarkable. No pancreatic ductal dilatation or surrounding inflammatory changes. Spleen: Normal in size without focal abnormality. Adrenals/Urinary Tract: Stable nodularity of left adrenal gland. Right adrenal gland is normal. Intrarenal vascular calcifications. Punctate nonobstructing stone in the kidneys bilaterally. The bladder is normal Stomach/Bowel: Stomach is within normal limits. Appendix appears normal. No evidence of bowel wall thickening, distention, or inflammatory changes. Sigmoid colon diverticular disease without acute inflammation. Vascular/Lymphatic:  Extensive aortic atherosclerosis. Bilobed infrarenal abdominal aortic aneurysm, measuring 5 cm in maximum diameter. Aneurysm terminates at the bifurcation. 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 No significantly enlarged lymph nodes. Reproductive: Prostate is unremarkable. Other: Fat within the left greater than right inguinal canal. No free air or free fluid. Musculoskeletal: Degenerative changes. No acute or suspicious lesion IMPRESSION: 1. No CT evidence for acute intra-abdominal, intrathoracic or intrapelvic abnormality. 2. Bilobed infrarenal abdominal aortic aneurysm measuring up to 5 cm in diameter. Recommend followup by abdomen and pelvis CTA in 3-6 months, and vascular surgery referral/consultation if not already obtained. This recommendation follows ACR consensus guidelines: White Paper of the ACR Incidental Findings Committee II on Vascular Findings. J Am Coll Radiol 2013; 10:789-794. 3. Sigmoid colon diverticular disease without acute inflammation. Electronically Signed   By: KDonavan FoilM.D.   On: 06/18/2017 22:14   Ct Abdomen Pelvis W Contrast  Result Date: 06/18/2017 CLINICAL DATA:  Syncopal episode, hit left flank left flank pain EXAM: CT CHEST, ABDOMEN, AND PELVIS WITH CONTRAST TECHNIQUE: Multidetector CT imaging of the chest, abdomen and pelvis was performed following the standard protocol during bolus administration of intravenous contrast. CONTRAST:  754mISOVUE-300 IOPAMIDOL (ISOVUE-300) INJECTION 61% COMPARISON:  12/22/2014 CT abdomen pelvis FINDINGS: CT CHEST  FINDINGS Cardiovascular: Nonaneurysmal aorta. Moderate aortic atherosclerosis. Post CABG changes. Coronary artery calcification. Normal heart size. No pericardial effusion. Mediastinum/Nodes: No evidence for mediastinal hematoma. Midline trachea. No thyroid mass. No significantly enlarged lymph nodes. The esophagus is within normal limits. Lungs/Pleura: Lungs are clear. No pleural effusion or pneumothorax.  Musculoskeletal: Post sternotomy. No acute or suspicious lesion. Partially visualized hardware in the cervical spine. Degenerative changes. CT ABDOMEN PELVIS FINDINGS Hepatobiliary: Multiple cysts in the liver. No calcified gallstones or biliary dilatation. Small vascular calcification at the porta hepatis. Pancreas: Unremarkable. No pancreatic ductal dilatation or surrounding inflammatory changes. Spleen: Normal in size without focal abnormality. Adrenals/Urinary Tract: Stable nodularity of left adrenal gland. Right adrenal gland is normal. Intrarenal vascular calcifications. Punctate nonobstructing stone in the kidneys bilaterally. The bladder is normal Stomach/Bowel: Stomach is within normal limits. Appendix appears normal. No evidence of bowel wall thickening, distention, or inflammatory changes. Sigmoid colon diverticular disease without acute inflammation. Vascular/Lymphatic: Extensive aortic atherosclerosis. Bilobed infrarenal abdominal aortic aneurysm, measuring 5 cm in maximum diameter. Aneurysm terminates at the bifurcation. 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 No significantly enlarged lymph nodes. Reproductive: Prostate is unremarkable. Other: Fat within the left greater than right inguinal canal. No free air or free fluid. Musculoskeletal: Degenerative changes. No acute or suspicious lesion IMPRESSION: 1. No CT evidence for acute intra-abdominal, intrathoracic or intrapelvic abnormality. 2. Bilobed infrarenal abdominal aortic aneurysm measuring up to 5 cm in diameter. Recommend followup by abdomen and pelvis CTA in 3-6 months, and vascular surgery referral/consultation if not already obtained. This recommendation follows ACR consensus guidelines: White Paper of the ACR Incidental Findings Committee II on Vascular Findings. J Am Coll Radiol 2013; 10:789-794. 3. Sigmoid colon diverticular disease without acute inflammation. Electronically Signed   By: Donavan Foil M.D.   On: 06/18/2017  22:14   Microbiology: Recent Results (from the past 240 hour(s))  MRSA PCR Screening     Status: None   Collection Time: 06/19/17  4:55 AM  Result Value Ref Range Status   MRSA by PCR NEGATIVE NEGATIVE Final    Comment:        The GeneXpert MRSA Assay (FDA approved for NASAL specimens only), is one component of a comprehensive MRSA colonization surveillance program. It is not intended to diagnose MRSA infection nor to guide or monitor treatment for MRSA infections. Performed at [facility], [address], [place], [place] [address]      Labs: Basic Metabolic Panel: Recent Labs  Lab 06/19/17 1520 06/19/17 1946 06/19/17 2306 06/20/17 0325 06/21/17 0945  NA 137 134* 133* 134* 130*  K 4.1 4.7 4.6 4.4 3.9  CL 102 102 105 105 99*  CO2 23 21* 18* 21* 23  GLUCOSE 231* 411* 351* 317* 311*  BUN 31* 33* 31* 28* 25*  CREATININE 1.15 1.14 1.07 1.04 1.07  CALCIUM 9.4 9.0 8.7* 8.4* 8.6*   Liver Function Tests: Recent Labs  Lab 06/18/17 2010  AST 21  ALT 25  ALKPHOS 141*  BILITOT 1.1  PROT 7.3  ALBUMIN 4.1   CBC: Recent Labs  Lab 06/18/17 1938 06/19/17 0301  WBC 10.8* 14.0*  HGB 15.9 15.3  HCT 48.1 44.1  MCV 96.0 91.3  PLT 168 173   Cardiac Enzymes: Recent Labs  Lab 06/18/17 2010  TROPONINI <0.03   CBG: Recent Labs  Lab 06/20/17 0751 06/20/17 1151 06/20/17 1554 06/20/17 2129 06/21/17 0743  GLUCAP 281* 287* 254* 265* 234*    Signed:  Barton Dubois MD.  [facility]  06/21/2017, 10:46 AM

## 2017-06-21 NOTE — Telephone Encounter (Signed)
Written.Will you sign? Rx at Nurses station.Thanks

## 2017-06-21 NOTE — Telephone Encounter (Signed)
Patient was just discharged from the hospital after being treated for diabetes.  He said that the pharmacy is unable to fill the Rx for the blood glucose meter from the hospital and the pharmacist told him that Dr. Richardson Landry would have to send in a prescription for this.  Please advise.   [place]

## 2017-06-24 ENCOUNTER — Telehealth: Payer: Self-pay | Admitting: Family Medicine

## 2017-06-24 ENCOUNTER — Other Ambulatory Visit: Payer: Self-pay | Admitting: *Deleted

## 2017-06-24 DIAGNOSIS — I714 Abdominal aortic aneurysm, without rupture: Secondary | ICD-10-CM | POA: Diagnosis not present

## 2017-06-24 DIAGNOSIS — E1151 Type 2 diabetes mellitus with diabetic peripheral angiopathy without gangrene: Secondary | ICD-10-CM | POA: Diagnosis not present

## 2017-06-24 DIAGNOSIS — Z951 Presence of aortocoronary bypass graft: Secondary | ICD-10-CM | POA: Diagnosis not present

## 2017-06-24 DIAGNOSIS — I129 Hypertensive chronic kidney disease with stage 1 through stage 4 chronic kidney disease, or unspecified chronic kidney disease: Secondary | ICD-10-CM | POA: Diagnosis not present

## 2017-06-24 DIAGNOSIS — Z87891 Personal history of nicotine dependence: Secondary | ICD-10-CM | POA: Diagnosis not present

## 2017-06-24 DIAGNOSIS — E1140 Type 2 diabetes mellitus with diabetic neuropathy, unspecified: Secondary | ICD-10-CM | POA: Diagnosis not present

## 2017-06-24 DIAGNOSIS — I2510 Atherosclerotic heart disease of native coronary artery without angina pectoris: Secondary | ICD-10-CM | POA: Diagnosis not present

## 2017-06-24 DIAGNOSIS — K5790 Diverticulosis of intestine, part unspecified, without perforation or abscess without bleeding: Secondary | ICD-10-CM | POA: Diagnosis not present

## 2017-06-24 DIAGNOSIS — E1110 Type 2 diabetes mellitus with ketoacidosis without coma: Secondary | ICD-10-CM | POA: Diagnosis not present

## 2017-06-24 DIAGNOSIS — E1122 Type 2 diabetes mellitus with diabetic chronic kidney disease: Secondary | ICD-10-CM | POA: Diagnosis not present

## 2017-06-24 DIAGNOSIS — E785 Hyperlipidemia, unspecified: Secondary | ICD-10-CM | POA: Diagnosis not present

## 2017-06-24 DIAGNOSIS — Z8673 Personal history of transient ischemic attack (TIA), and cerebral infarction without residual deficits: Secondary | ICD-10-CM | POA: Diagnosis not present

## 2017-06-24 DIAGNOSIS — Z794 Long term (current) use of insulin: Secondary | ICD-10-CM | POA: Diagnosis not present

## 2017-06-24 DIAGNOSIS — N183 Chronic kidney disease, stage 3 (moderate): Secondary | ICD-10-CM | POA: Diagnosis not present

## 2017-06-24 NOTE — Patient Outreach (Signed)
[facility]) Care Management  06/24/2017  [name] [birth year] [id number]     Transition of care services   [facility] ([place]) [facility] ([place]) was notified by [facility] CMA that a referral was received from [place] hospital liaison on 07/01/17 -  Please call [name],  According to Epic [name] is still in the hospital AP-ICCUP NURSING / IC09 / IC09-01  Please see Janci's message below. Thank you   Please assign patient for community nurse to engage for transition of care calls and evaluate for monthly home visits. New diabetic with insulin ordered. For questions please contact:   With review of Epic notes CM found  Admission on 06/18/17 discharge summary for 07/01/17 for dx DKA (diabetic ketoacidosis), acute on chronic stage III renal failure in the setting of dehydration from DKA (keep well hydrated) abdominal aortic aneurysm, PVD, ASCVD, HTN, HDL, Hyperkalemia, Cerebrovascular disease Left with Lantus 22 units qd and novolog rapid insulin 6  Units tid with meals F/u with Primary care provider  in 10 days, diet- heart healthy and modified carbohydrates diet- d/c wt 201 lbs, HgA1c on 06/18/17 was 11.2 (02/20/16 was 6.1),  ht 5'10"  When Cm called [name] Cm was informed Dr Wolfgang Phoenix would see him tomorrow and "I will go to who [name] refers me to"    CM was informed "Someone came out today" " cheryl foster from  [facility]" "[name] informed CM [name] would calHe informed CM [name] would not prefer not to have two different agencies in his home and [name]  disconnected the phone from CM refusing services   Plans CM spoke with [name] about [facility] ([place] Curator (CM) program and referral  case closure for Consumer is eligible for program but refused to participate  Marion L. Lavina Hamman, RN, BSN, [place] Coordinator [phone] week day mobile        Plans CM will make another call  attempt in 1-3 business days  [facility] CM left a HIPPA compliant voice message for  including [facility] CM mobile number for a return call.   Route note to care team MDs listed in [place]. Lavina Hamman, RN, BSN, [place] Coordinator [phone] week day mobile

## 2017-06-24 NOTE — Telephone Encounter (Signed)
Patient has appointment with Dr Richardson Landry 06/25/17

## 2017-06-24 NOTE — Telephone Encounter (Signed)
Malachy Mood, nurse with [facility], wanted to let Dr. Richardson Landry know that she checked [name]'s BP today.  Sitting it was 140/70, standing it was 90/60.  He is asymptomatic with it, but wants to know what Dr. Richardson Landry thinks.

## 2017-06-24 NOTE — Telephone Encounter (Signed)
good

## 2017-06-24 NOTE — Telephone Encounter (Signed)
I should be seeing later this wk? Will disc then since asymto

## 2017-06-25 ENCOUNTER — Ambulatory Visit (INDEPENDENT_AMBULATORY_CARE_PROVIDER_SITE_OTHER): Payer: Medicare Other | Admitting: Family Medicine

## 2017-06-25 ENCOUNTER — Encounter: Payer: Self-pay | Admitting: Family Medicine

## 2017-06-25 DIAGNOSIS — Z794 Long term (current) use of insulin: Secondary | ICD-10-CM

## 2017-06-25 DIAGNOSIS — E118 Type 2 diabetes mellitus with unspecified complications: Secondary | ICD-10-CM

## 2017-06-25 MED ORDER — NORTRIPTYLINE HCL 25 MG PO CAPS: 25.0000 mg | ORAL_CAPSULE | Freq: Every day | ORAL | 5 refills | Status: DC

## 2017-06-25 MED ORDER — ALPRAZOLAM 1 MG PO TABS: ORAL_TABLET | ORAL | 4 refills | Status: DC

## 2017-06-25 MED ORDER — LISINOPRIL 20 MG PO TABS: 20.0000 mg | ORAL_TABLET | Freq: Two times a day (BID) | ORAL | 5 refills | Status: DC

## 2017-06-25 MED ORDER — INSULIN ASPART 100 UNIT/ML FLEXPEN: 6.0000 [IU] | PEN_INJECTOR | Freq: Three times a day (TID) | SUBCUTANEOUS | 3 refills | Status: DC

## 2017-06-25 MED ORDER — PEN NEEDLES 31G X 8 MM MISC: 3 refills | Status: DC

## 2017-06-25 MED ORDER — INSULIN GLARGINE 100 UNITS/ML SOLOSTAR PEN: 28.0000 [IU] | PEN_INJECTOR | Freq: Every day | SUBCUTANEOUS | 3 refills | Status: DC

## 2017-06-25 MED ORDER — TEMAZEPAM 30 MG PO CAPS: 30.0000 mg | ORAL_CAPSULE | Freq: Every day | ORAL | 5 refills | Status: DC

## 2017-06-25 MED ORDER — HYDROCHLOROTHIAZIDE 25 MG PO TABS: 25.0000 mg | ORAL_TABLET | Freq: Every day | ORAL | 5 refills | Status: DC

## 2017-06-25 MED ORDER — CLOPIDOGREL BISULFATE 75 MG PO TABS: 75.0000 mg | ORAL_TABLET | Freq: Every day | ORAL | 5 refills | Status: DC

## 2017-06-25 MED ORDER — SIMVASTATIN 80 MG PO TABS: 80.0000 mg | ORAL_TABLET | Freq: Every day | ORAL | 5 refills | Status: DC

## 2017-06-25 NOTE — Progress Notes (Signed)
   Subjective:    Patient ID: [name], male    DOB: 05-24-1940, 77 y.o.   MRN: [id number]  HPI  Pt comes in for hospital follow up. Pt was in hospital from 06/18/16-06/21/16. Pt would like to understand more on diabetes.  Pt fell about a day before going to hospital and has bruise on left side from a fall.  Full hosptial record reviewed  g mo had diabetes  Six units before each meal  [place]orn numbers 182   Entire hospital record with consult notes.  Progress notes.  Lab results.  Scan results all reviewed in presence of patient.  History of prediabetes.  In the weeks leading up to admission and noted frequent urination frequent thirst.  Started to develop substantial nocturia.  Check a fall immediately before hospitalization subsequent scan showed no fractures  Positive family history of diabetes.  Patient monitoring sugars frequently   Review of Systems No headache, no major weight loss or weight gain, no chest pain no back pain abdominal pain no change in bowel habits complete ROS otherwise negative     Objective:   Physical Exam   Alert and oriented, vitals reviewed and stable, NAD ENT-TM's and ext canals WNL bilat via otoscopic exam Soft palate, tonsils and post pharynx WNL via oropharyngeal exam Neck-symmetric, no masses; thyroid nonpalpable and nontender Pulmonary-no tachypnea or accessory muscle use; Clear without wheezes via auscultation Card--no abnrml murmurs, rhythm reg and rate WNL Carotid pulses symmetric, without bruits      Assessment & Plan:  Impression new onset diabetes.  Likely type II.  Insulin initiated at the start.  Will increase Lantus somewhat since fasting sugars 180.  Hypoglycemia discussed.  Pre-educational session discussed at hospital.  Symptom care for contusion.  Patient does have some home health and her accident  Follow-up in 1 month warning signs discussed numerous issues regarding diabetes discussed at length  Greater than 50% of  this 40 minute face to face visit was spent in counseling and discussion and coordination of care regarding the above diagnosis/diagnosies

## 2017-06-27 DIAGNOSIS — I2510 Atherosclerotic heart disease of native coronary artery without angina pectoris: Secondary | ICD-10-CM | POA: Diagnosis not present

## 2017-06-27 DIAGNOSIS — I129 Hypertensive chronic kidney disease with stage 1 through stage 4 chronic kidney disease, or unspecified chronic kidney disease: Secondary | ICD-10-CM | POA: Diagnosis not present

## 2017-06-27 DIAGNOSIS — E118 Type 2 diabetes mellitus with unspecified complications: Secondary | ICD-10-CM | POA: Insufficient documentation

## 2017-06-27 DIAGNOSIS — E1110 Type 2 diabetes mellitus with ketoacidosis without coma: Secondary | ICD-10-CM | POA: Diagnosis not present

## 2017-06-27 DIAGNOSIS — N183 Chronic kidney disease, stage 3 (moderate): Secondary | ICD-10-CM | POA: Diagnosis not present

## 2017-06-27 DIAGNOSIS — E1122 Type 2 diabetes mellitus with diabetic chronic kidney disease: Secondary | ICD-10-CM | POA: Diagnosis not present

## 2017-06-27 DIAGNOSIS — E1151 Type 2 diabetes mellitus with diabetic peripheral angiopathy without gangrene: Secondary | ICD-10-CM | POA: Diagnosis not present

## 2017-07-01 ENCOUNTER — Other Ambulatory Visit: Payer: Self-pay | Admitting: *Deleted

## 2017-07-01 ENCOUNTER — Telehealth: Payer: Self-pay | Admitting: Family Medicine

## 2017-07-01 MED ORDER — BLOOD GLUCOSE MONITOR KIT: PACK | 0 refills | Status: DC

## 2017-07-01 NOTE — Telephone Encounter (Signed)
Pt.notified

## 2017-07-01 NOTE — Telephone Encounter (Signed)
Patient needs a prescription for Accu-check brand meter to [employer].  Along with meters, lancets and strips.  Patient gave this information to Autumn when she was pulling back a patient.

## 2017-07-01 NOTE — Telephone Encounter (Signed)
Ok per dr Richardson Landry. Script printed and faxed to [employer] [facility]

## 2017-07-02 ENCOUNTER — Telehealth: Payer: Self-pay | Admitting: Family Medicine

## 2017-07-02 DIAGNOSIS — I2510 Atherosclerotic heart disease of native coronary artery without angina pectoris: Secondary | ICD-10-CM | POA: Diagnosis not present

## 2017-07-02 DIAGNOSIS — E1151 Type 2 diabetes mellitus with diabetic peripheral angiopathy without gangrene: Secondary | ICD-10-CM | POA: Diagnosis not present

## 2017-07-02 DIAGNOSIS — N183 Chronic kidney disease, stage 3 (moderate): Secondary | ICD-10-CM | POA: Diagnosis not present

## 2017-07-02 DIAGNOSIS — E1110 Type 2 diabetes mellitus with ketoacidosis without coma: Secondary | ICD-10-CM | POA: Diagnosis not present

## 2017-07-02 DIAGNOSIS — I129 Hypertensive chronic kidney disease with stage 1 through stage 4 chronic kidney disease, or unspecified chronic kidney disease: Secondary | ICD-10-CM | POA: Diagnosis not present

## 2017-07-02 DIAGNOSIS — E1122 Type 2 diabetes mellitus with diabetic chronic kidney disease: Secondary | ICD-10-CM | POA: Diagnosis not present

## 2017-07-02 NOTE — Telephone Encounter (Signed)
Error-Appt Made

## 2017-07-03 DIAGNOSIS — I2510 Atherosclerotic heart disease of native coronary artery without angina pectoris: Secondary | ICD-10-CM | POA: Diagnosis not present

## 2017-07-03 DIAGNOSIS — E1122 Type 2 diabetes mellitus with diabetic chronic kidney disease: Secondary | ICD-10-CM | POA: Diagnosis not present

## 2017-07-03 DIAGNOSIS — I129 Hypertensive chronic kidney disease with stage 1 through stage 4 chronic kidney disease, or unspecified chronic kidney disease: Secondary | ICD-10-CM | POA: Diagnosis not present

## 2017-07-03 DIAGNOSIS — E1110 Type 2 diabetes mellitus with ketoacidosis without coma: Secondary | ICD-10-CM | POA: Diagnosis not present

## 2017-07-04 ENCOUNTER — Telehealth: Payer: Self-pay | Admitting: Family Medicine

## 2017-07-04 ENCOUNTER — Other Ambulatory Visit: Payer: Self-pay | Admitting: Family Medicine

## 2017-07-04 ENCOUNTER — Ambulatory Visit (INDEPENDENT_AMBULATORY_CARE_PROVIDER_SITE_OTHER): Payer: Medicare Other | Admitting: Family Medicine

## 2017-07-04 ENCOUNTER — Encounter: Payer: Self-pay | Admitting: Family Medicine

## 2017-07-04 VITALS — Ht 70.0 in | Wt 195.1 lb

## 2017-07-04 DIAGNOSIS — E861 Hypovolemia: Secondary | ICD-10-CM | POA: Diagnosis not present

## 2017-07-04 DIAGNOSIS — I952 Hypotension due to drugs: Secondary | ICD-10-CM | POA: Diagnosis not present

## 2017-07-04 DIAGNOSIS — I9589 Other hypotension: Secondary | ICD-10-CM | POA: Diagnosis not present

## 2017-07-04 DIAGNOSIS — R55 Syncope and collapse: Secondary | ICD-10-CM | POA: Diagnosis not present

## 2017-07-04 MED ORDER — LISINOPRIL 10 MG PO TABS: ORAL_TABLET | ORAL | 1 refills | Status: DC

## 2017-07-04 NOTE — Telephone Encounter (Signed)
Patient states medicare needs to know how many test strips he needs before they can refill his prescription.

## 2017-07-04 NOTE — Telephone Encounter (Signed)
Spoke with patient. He stated that pharmacy told him this. Attempted to call pharmacy [facility] on [address] but was on hold for about 20 minutes.

## 2017-07-04 NOTE — Progress Notes (Signed)
   Subjective:    Patient ID: [name], male    DOB: [birth year], 77 y.o.   MRN: [id number]  HPI Patient is here today due to [facility] calling stating pt needed to be seen due to low BP.He is supposed to be on  Lisinopril 20 mg one BID.States home health nurse told him to hold the lisinopril,so he has not taken it as supposed to.He states he is not dizzy and does not have any symptoms of a low bp.Orthostatic were done today in the office and the pt bps drop significantly from lying to sitting and sitting to standing.States again he is asymptomatic,but has past passed out twice with in a week.  BP started climbing some yest, pt too, pt took  A lisinopril yest  Full hospital record reviewed at great length.  Patient reports 2 episodes of syncope.  Both occurring while urinating.  Patient also notes lightheadedness when standing quickly  Had not taken any hctz or lixinopril since last thur  Review of Systems No headache, no major weight loss or weight gain, no chest pain no back pain abdominal pain no change in bowel habits complete ROS otherwise negative     Objective:   Physical Exam  Alert and oriented, vitals reviewed and stable, NAD ENT-TM's and ext canals WNL bilat via otoscopic exam Soft palate, tonsils and post pharynx WNL via oropharyngeal exam Neck-symmetric, no masses; thyroid nonpalpable and nontender Pulmonary-no tachypnea or accessory muscle use; Clear without wheezes via auscultation Card--no abnrml murmurs, rhythm reg and rate WNL Carotid pulses symmetric, without bruits       Assessment & Plan:  Impression orthostatic hypotension.  Also micturition syncope occurring immediately after arising.  Exacerbated by combination diuretic and high-dose ACE inhibitors.  Also exacerbated by recent diabetes along with glucosuria and volume contraction.  Discussed at great length.  Blood pressure medications adjusted.  Rationale discussed.  Full hospital records of late reviewed with  patient along with results.  Greater than 50% of this 25 minute face to face visit was spent in counseling and discussion and coordination of care regarding the above diagnosis/diagnosies

## 2017-07-04 NOTE — Telephone Encounter (Signed)
[place] with five ref

## 2017-07-05 DIAGNOSIS — E1110 Type 2 diabetes mellitus with ketoacidosis without coma: Secondary | ICD-10-CM | POA: Diagnosis not present

## 2017-07-05 DIAGNOSIS — I129 Hypertensive chronic kidney disease with stage 1 through stage 4 chronic kidney disease, or unspecified chronic kidney disease: Secondary | ICD-10-CM | POA: Diagnosis not present

## 2017-07-05 DIAGNOSIS — I2510 Atherosclerotic heart disease of native coronary artery without angina pectoris: Secondary | ICD-10-CM | POA: Diagnosis not present

## 2017-07-05 DIAGNOSIS — E1122 Type 2 diabetes mellitus with diabetic chronic kidney disease: Secondary | ICD-10-CM | POA: Diagnosis not present

## 2017-07-05 DIAGNOSIS — E1151 Type 2 diabetes mellitus with diabetic peripheral angiopathy without gangrene: Secondary | ICD-10-CM | POA: Diagnosis not present

## 2017-07-05 DIAGNOSIS — N183 Chronic kidney disease, stage 3 (moderate): Secondary | ICD-10-CM | POA: Diagnosis not present

## 2017-07-05 NOTE — Telephone Encounter (Signed)
Called [facility] on [address] to see what was going on. Pharm states they sent [place] a CMN form to fill out on 2/20. Medicare requires this form because his testing was increased from three times a day to four times a day. Pharm states they will call medicare and have them resend the CMN form to our office to fill out for pt. Left message to return call to discuss with pt.

## 2017-07-08 DIAGNOSIS — I129 Hypertensive chronic kidney disease with stage 1 through stage 4 chronic kidney disease, or unspecified chronic kidney disease: Secondary | ICD-10-CM | POA: Diagnosis not present

## 2017-07-08 DIAGNOSIS — E1122 Type 2 diabetes mellitus with diabetic chronic kidney disease: Secondary | ICD-10-CM | POA: Diagnosis not present

## 2017-07-08 DIAGNOSIS — N183 Chronic kidney disease, stage 3 (moderate): Secondary | ICD-10-CM | POA: Diagnosis not present

## 2017-07-08 DIAGNOSIS — I2510 Atherosclerotic heart disease of native coronary artery without angina pectoris: Secondary | ICD-10-CM | POA: Diagnosis not present

## 2017-07-08 DIAGNOSIS — E1151 Type 2 diabetes mellitus with diabetic peripheral angiopathy without gangrene: Secondary | ICD-10-CM | POA: Diagnosis not present

## 2017-07-08 DIAGNOSIS — E1110 Type 2 diabetes mellitus with ketoacidosis without coma: Secondary | ICD-10-CM | POA: Diagnosis not present

## 2017-07-09 NOTE — Telephone Encounter (Signed)
I spoke with [name] at [facility] he will have another CMN sent over.

## 2017-07-15 MED ORDER — BLOOD GLUCOSE MONITOR KIT: PACK | 0 refills | Status: DC

## 2017-07-15 NOTE — Telephone Encounter (Signed)
Patient called back the strips still have not been filled at [facility] patient would like to switch them to [facility] in [place] he is asking they strips be sent there and his pharmacy be changed.

## 2017-07-15 NOTE — Addendum Note (Signed)
Addended by: Dairl Ponder on: 07/15/2017 11:57 AM   Modules accepted: Orders

## 2017-07-15 NOTE — Telephone Encounter (Signed)
Prescription faxed to [place].

## 2017-07-22 DIAGNOSIS — I2510 Atherosclerotic heart disease of native coronary artery without angina pectoris: Secondary | ICD-10-CM | POA: Diagnosis not present

## 2017-07-22 DIAGNOSIS — E1122 Type 2 diabetes mellitus with diabetic chronic kidney disease: Secondary | ICD-10-CM | POA: Diagnosis not present

## 2017-07-22 DIAGNOSIS — E1110 Type 2 diabetes mellitus with ketoacidosis without coma: Secondary | ICD-10-CM | POA: Diagnosis not present

## 2017-07-22 DIAGNOSIS — N183 Chronic kidney disease, stage 3 (moderate): Secondary | ICD-10-CM | POA: Diagnosis not present

## 2017-07-22 DIAGNOSIS — E1151 Type 2 diabetes mellitus with diabetic peripheral angiopathy without gangrene: Secondary | ICD-10-CM | POA: Diagnosis not present

## 2017-07-22 DIAGNOSIS — I129 Hypertensive chronic kidney disease with stage 1 through stage 4 chronic kidney disease, or unspecified chronic kidney disease: Secondary | ICD-10-CM | POA: Diagnosis not present

## 2017-07-23 ENCOUNTER — Ambulatory Visit (INDEPENDENT_AMBULATORY_CARE_PROVIDER_SITE_OTHER): Payer: Medicare Other | Admitting: Family Medicine

## 2017-07-23 ENCOUNTER — Encounter: Payer: Self-pay | Admitting: Family Medicine

## 2017-07-23 VITALS — Ht 70.0 in | Wt 193.0 lb

## 2017-07-23 DIAGNOSIS — I10 Essential (primary) hypertension: Secondary | ICD-10-CM | POA: Diagnosis not present

## 2017-07-23 DIAGNOSIS — E118 Type 2 diabetes mellitus with unspecified complications: Secondary | ICD-10-CM | POA: Diagnosis not present

## 2017-07-23 DIAGNOSIS — Z794 Long term (current) use of insulin: Secondary | ICD-10-CM | POA: Diagnosis not present

## 2017-07-23 DIAGNOSIS — R55 Syncope and collapse: Secondary | ICD-10-CM | POA: Diagnosis not present

## 2017-07-23 MED ORDER — LISINOPRIL 20 MG PO TABS: ORAL_TABLET | ORAL | 5 refills | Status: DC

## 2017-07-23 MED ORDER — NORTRIPTYLINE HCL 25 MG PO CAPS: 25.0000 mg | ORAL_CAPSULE | Freq: Every day | ORAL | 5 refills | Status: DC

## 2017-07-23 NOTE — Patient Instructions (Signed)
Decrease to 16 units lantus  Stop giving rapid acting insu Cerebrallin before breakfast  If number below 75 before a meal, do not give the rapid acting insulin

## 2017-07-23 NOTE — Progress Notes (Signed)
   Subjective:    Patient ID: [name], male    DOB: [birth year], 77 y.o.   MRN: [id number]  HPI Patient is back today for a recheck on micturition syncope.He state he change lisinopril to 20 mg BID from the 10 mg QD.He states the bp was going to high so he increased it on his own, but he did let the [facility] nurse know what he was going to do. He states he feels much better no more episodes of the syncope.  Patient increase lisinopril back up to 20 twice daily, under direction of his home health nurse.  Is because his blood pressure lying.  Has handled well.  No orthostatic symptoms.  Notes his sugars are running on the tight side.  No low sugar spells.  Claims compliance with insulin.  Using rapid insulin before all 3 meals and once daily.     Orthostatics done on pt today.  Review of Systems No headache, no major weight loss or weight gain, no chest pain no back pain abdominal pain no change in bowel habits complete ROS otherwise negative     Objective:   Physical Exam  Alert and oriented, vitals reviewed and stable, NAD ENT-TM's and ext canals WNL bilat via otoscopic exam Soft palate, tonsils and post pharynx WNL via oropharyngeal exam Neck-symmetric, no masses; thyroid nonpalpable and nontender Pulmonary-no tachypnea or accessory muscle use; Clear without wheezes via auscultation Card--no abnrml murmurs, rhythm reg and rate WNL Carotid pulses symmetric, without bruits       Assessment & Plan:  Impression 1 hypertension numbers seesawing.  Will need to resume lisinopril and maintain dose.  Will avoid diuretic component to reduce risk of orthostatic hypotension discussed at length  2.  Diabetes controlled to type.  Will decrease Lantus.  Also will stop 1 prenatal shot.  Rationale discussed.  Warning signs discussed  Greater than 50% of this 25 minute face to face visit was spent in counseling and discussion and coordination of care regarding the above  diagnosis/diagnosies  Follow-up as scheduled

## 2017-07-24 ENCOUNTER — Other Ambulatory Visit (HOSPITAL_COMMUNITY): Payer: Medicare Other

## 2017-07-24 ENCOUNTER — Ambulatory Visit: Payer: Medicare Other | Admitting: Vascular Surgery

## 2017-08-07 ENCOUNTER — Telehealth: Payer: Self-pay | Admitting: Family Medicine

## 2017-08-07 DIAGNOSIS — E1151 Type 2 diabetes mellitus with diabetic peripheral angiopathy without gangrene: Secondary | ICD-10-CM | POA: Diagnosis not present

## 2017-08-07 DIAGNOSIS — N183 Chronic kidney disease, stage 3 (moderate): Secondary | ICD-10-CM | POA: Diagnosis not present

## 2017-08-07 DIAGNOSIS — I2510 Atherosclerotic heart disease of native coronary artery without angina pectoris: Secondary | ICD-10-CM | POA: Diagnosis not present

## 2017-08-07 DIAGNOSIS — E1110 Type 2 diabetes mellitus with ketoacidosis without coma: Secondary | ICD-10-CM | POA: Diagnosis not present

## 2017-08-07 DIAGNOSIS — I129 Hypertensive chronic kidney disease with stage 1 through stage 4 chronic kidney disease, or unspecified chronic kidney disease: Secondary | ICD-10-CM | POA: Diagnosis not present

## 2017-08-07 DIAGNOSIS — E1122 Type 2 diabetes mellitus with diabetic chronic kidney disease: Secondary | ICD-10-CM | POA: Diagnosis not present

## 2017-08-07 NOTE — Telephone Encounter (Signed)
[name] with [facility] called to let Dr. Richardson Landry know that [name] stopped taking his insulin about 2 weeks ago.  His blood sugar has been running good, this morning it was 110.

## 2017-08-07 NOTE — Telephone Encounter (Signed)
Per [name] Patient stopped his insulin due to the readings being in the 60-70's. This am was 110 with out insulin.

## 2017-08-07 NOTE — Telephone Encounter (Signed)
Ok

## 2017-08-19 ENCOUNTER — Ambulatory Visit (INDEPENDENT_AMBULATORY_CARE_PROVIDER_SITE_OTHER): Payer: Medicare Other | Admitting: Family Medicine

## 2017-08-19 ENCOUNTER — Encounter: Payer: Self-pay | Admitting: Family Medicine

## 2017-08-19 VITALS — BP 142/74 | Ht 70.0 in | Wt 190.0 lb

## 2017-08-19 DIAGNOSIS — E7801 Familial hypercholesterolemia: Secondary | ICD-10-CM

## 2017-08-19 DIAGNOSIS — E118 Type 2 diabetes mellitus with unspecified complications: Secondary | ICD-10-CM | POA: Diagnosis not present

## 2017-08-19 DIAGNOSIS — E78019 Familial hypercholesterolemia, unspecified: Secondary | ICD-10-CM

## 2017-08-19 DIAGNOSIS — I10 Essential (primary) hypertension: Secondary | ICD-10-CM

## 2017-08-19 DIAGNOSIS — Z0001 Encounter for general adult medical examination with abnormal findings: Secondary | ICD-10-CM | POA: Diagnosis not present

## 2017-08-19 DIAGNOSIS — Z794 Long term (current) use of insulin: Secondary | ICD-10-CM

## 2017-08-19 DIAGNOSIS — E785 Hyperlipidemia, unspecified: Secondary | ICD-10-CM | POA: Diagnosis not present

## 2017-08-19 DIAGNOSIS — Z0000 Encounter for general adult medical examination without abnormal findings: Secondary | ICD-10-CM

## 2017-08-19 MED ORDER — LISINOPRIL 20 MG PO TABS: ORAL_TABLET | ORAL | 5 refills | Status: DC

## 2017-08-19 NOTE — Progress Notes (Signed)
Subjective:    Patient ID: [name], male    DOB: [birth year], 77 y.o.   MRN: [id number]  HPI AWV- Annual Wellness Visit  The patient was seen for their annual wellness visit. The patient's past medical history, surgical history, and family history were reviewed. Pertinent vaccines were reviewed ( tetanus, pneumonia, shingles, flu) The patient's medication list was reviewed and updated.  The height and weight were entered.  BMI recorded in electronic record elsewhere  Cognitive screening was completed. Outcome of Mini - Cog: pass   Falls /depression screening electronically recorded within record elsewhere  Current tobacco usage: none (All patients who use tobacco were given written and verbal information on quitting)  Recent listing of emergency department/hospitalizations over the past year were reviewed.  current specialist the patient sees on a regular basis: vein Dr. Gabriel Earing  Medicare annual wellness visit patient questionnaire was reviewed.  A written screening schedule for the patient for the next 5-10 years was given. Appropriate discussion of followup regarding next visit was discussed.  Pt stopped taking insulin because his sugar was dropping. Last A1C was 06/18/17 in the hospital 11.2.   Pt taking lisinopril different than prescribed. Prescribed 20mg  bid. Pt started taking 20mg  in the morning and 10mg  at night.   Blood pressure medicine and blood pressure levels reviewed today with patient. Compliant with blood pressure medicine. States does not miss a dose. No obvious side effects. Blood pressure generally good when checked elsewhere. Watching salt intake.  Results for orders placed or performed during the hospital encounter of 06/18/17  MRSA PCR Screening  Result Value Ref Range   MRSA by PCR NEGATIVE NEGATIVE  Basic metabolic panel  Result Value Ref Range   Sodium 124 (L) 135 - 145 mmol/L   Potassium 6.7 (HH) 3.5 - 5.1 mmol/L   Chloride 89 (L) 101 - 111  mmol/L   CO2 20 (L) 22 - 32 mmol/L   Glucose, Bld 1,173 (HH) 65 - 99 mg/dL   BUN 42 (H) 6 - 20 mg/dL   Creatinine, Ser 1.65 (H) 0.61 - 1.24 mg/dL   Calcium 9.7 8.9 - 10.3 mg/dL   GFR calc non Af Amer 38 (L) >60 mL/min   GFR calc Af Amer 44 (L) >60 mL/min   Anion gap 15 5 - 15  CBC  Result Value Ref Range   WBC 10.8 (H) 4.0 - 10.5 K/uL   RBC 5.01 4.22 - 5.81 MIL/uL   Hemoglobin 15.9 13.0 - 17.0 g/dL   HCT 48.1 39.0 - 52.0 %   MCV 96.0 78.0 - 100.0 fL   MCH 31.7 26.0 - 34.0 pg   MCHC 33.1 30.0 - 36.0 g/dL   RDW 12.5 11.5 - 15.5 %   Platelets 168 150 - 400 K/uL  Urinalysis, Routine w reflex microscopic  Result Value Ref Range   Color, Urine STRAW (A) YELLOW   APPearance CLEAR CLEAR   Specific Gravity, Urine 1.026 1.005 - 1.030   pH 5.0 5.0 - 8.0   Glucose, UA >=500 (A) NEGATIVE mg/dL   Hgb urine dipstick NEGATIVE NEGATIVE   Bilirubin Urine NEGATIVE NEGATIVE   Ketones, ur 5 (A) NEGATIVE mg/dL   Protein, ur NEGATIVE NEGATIVE mg/dL   Nitrite NEGATIVE NEGATIVE   Leukocytes, UA NEGATIVE NEGATIVE   RBC / HPF 0-5 0 - 5 RBC/hpf   WBC, UA 0-5 0 - 5 WBC/hpf   Bacteria, UA NONE SEEN NONE SEEN   Squamous Epithelial / LPF NONE SEEN NONE SEEN  Hepatic function panel  Result Value Ref Range   Total Protein 7.3 6.5 - 8.1 g/dL   Albumin 4.1 3.5 - 5.0 g/dL   AST 21 15 - 41 U/L   ALT 25 17 - 63 U/L   Alkaline Phosphatase 141 (H) 38 - 126 U/L   Total Bilirubin 1.1 0.3 - 1.2 mg/dL   Bilirubin, Direct 0.2 0.1 - 0.5 mg/dL   Indirect Bilirubin 0.9 0.3 - 0.9 mg/dL  Troponin I  Result Value Ref Range   Troponin I <0.03 <0.03 ng/mL  Blood gas, venous  Result Value Ref Range   pH, Ven 7.265 7.250 - 7.430   pCO2, Ven 51.0 44.0 - 60.0 mmHg   pO2, Ven 47.7 (H) 32.0 - 45.0 mmHg   Bicarbonate 20.0 20.0 - 28.0 mmol/L   Acid-base deficit 3.5 (H) 0.0 - 2.0 mmol/L   O2 Saturation 74.6 %   Patient temperature 37.0    Collection site RIGHT ANTECUBITAL    Sample type VENOUS   Basic metabolic panel    Result Value Ref Range   Sodium 132 (L) 135 - 145 mmol/L   Potassium 5.3 (H) 3.5 - 5.1 mmol/L   Chloride 97 (L) 101 - 111 mmol/L   CO2 22 22 - 32 mmol/L   Glucose, Bld 832 (HH) 65 - 99 mg/dL   BUN 40 (H) 6 - 20 mg/dL   Creatinine, Ser 1.43 (H) 0.61 - 1.24 mg/dL   Calcium 9.0 8.9 - 10.3 mg/dL   GFR calc non Af Amer 45 (L) >60 mL/min   GFR calc Af Amer 53 (L) >60 mL/min   Anion gap 13 5 - 15  Basic metabolic panel  Result Value Ref Range   Sodium 139 135 - 145 mmol/L   Potassium 5.1 3.5 - 5.1 mmol/L   Chloride 105 101 - 111 mmol/L   CO2 23 22 - 32 mmol/L   Glucose, Bld 401 (H) 65 - 99 mg/dL   BUN 37 (H) 6 - 20 mg/dL   Creatinine, Ser 1.39 (H) 0.61 - 1.24 mg/dL   Calcium 9.6 8.9 - 10.3 mg/dL   GFR calc non Af Amer 47 (L) >60 mL/min   GFR calc Af Amer 54 (L) >60 mL/min   Anion gap 11 5 - 15  Basic metabolic panel  Result Value Ref Range   Sodium 141 135 - 145 mmol/L   Potassium 4.6 3.5 - 5.1 mmol/L   Chloride 106 101 - 111 mmol/L   CO2 27 22 - 32 mmol/L   Glucose, Bld 202 (H) 65 - 99 mg/dL   BUN 33 (H) 6 - 20 mg/dL   Creatinine, Ser 1.22 0.61 - 1.24 mg/dL   Calcium 9.6 8.9 - 10.3 mg/dL   GFR calc non Af Amer 55 (L) >60 mL/min   GFR calc Af Amer >60 >60 mL/min   Anion gap 8 5 - 15  Basic metabolic panel  Result Value Ref Range   Sodium 140 135 - 145 mmol/L   Potassium 4.3 3.5 - 5.1 mmol/L   Chloride 104 101 - 111 mmol/L   CO2 26 22 - 32 mmol/L   Glucose, Bld 160 (H) 65 - 99 mg/dL   BUN 31 (H) 6 - 20 mg/dL   Creatinine, Ser 1.12 0.61 - 1.24 mg/dL   Calcium 9.6 8.9 - 10.3 mg/dL   GFR calc non Af Amer >60 >60 mL/min   GFR calc Af Amer >60 >60 mL/min   Anion gap 10 5 - 15  CBC  Result Value Ref Range   WBC 14.0 (H) 4.0 - 10.5 K/uL   RBC 4.83 4.22 - 5.81 MIL/uL   Hemoglobin 15.3 13.0 - 17.0 g/dL   HCT 44.1 39.0 - 52.0 %   MCV 91.3 78.0 - 100.0 fL   MCH 31.7 26.0 - 34.0 pg   MCHC 34.7 30.0 - 36.0 g/dL   RDW 12.0 11.5 - 15.5 %   Platelets 173 150 - 400 K/uL   Hemoglobin A1c  Result Value Ref Range   Hgb A1c MFr Bld 11.2 (H) 4.8 - 5.6 %   Mean Plasma Glucose 275 mg/dL  Basic metabolic panel  Result Value Ref Range   Sodium 137 135 - 145 mmol/L   Potassium 4.1 3.5 - 5.1 mmol/L   Chloride 102 101 - 111 mmol/L   CO2 23 22 - 32 mmol/L   Glucose, Bld 231 (H) 65 - 99 mg/dL   BUN 31 (H) 6 - 20 mg/dL   Creatinine, Ser 1.15 0.61 - 1.24 mg/dL   Calcium 9.4 8.9 - 10.3 mg/dL   GFR calc non Af Amer 59 (L) >60 mL/min   GFR calc Af Amer >60 >60 mL/min   Anion gap 12 5 - 15  Basic metabolic panel  Result Value Ref Range   Sodium 134 (L) 135 - 145 mmol/L   Potassium 4.7 3.5 - 5.1 mmol/L   Chloride 102 101 - 111 mmol/L   CO2 21 (L) 22 - 32 mmol/L   Glucose, Bld 411 (H) 65 - 99 mg/dL   BUN 33 (H) 6 - 20 mg/dL   Creatinine, Ser 1.14 0.61 - 1.24 mg/dL   Calcium 9.0 8.9 - 10.3 mg/dL   GFR calc non Af Amer 60 (L) >60 mL/min   GFR calc Af Amer >60 >60 mL/min   Anion gap 11 5 - 15  Basic metabolic panel  Result Value Ref Range   Sodium 133 (L) 135 - 145 mmol/L   Potassium 4.6 3.5 - 5.1 mmol/L   Chloride 105 101 - 111 mmol/L   CO2 18 (L) 22 - 32 mmol/L   Glucose, Bld 351 (H) 65 - 99 mg/dL   BUN 31 (H) 6 - 20 mg/dL   Creatinine, Ser 1.07 0.61 - 1.24 mg/dL   Calcium 8.7 (L) 8.9 - 10.3 mg/dL   GFR calc non Af Amer >60 >60 mL/min   GFR calc Af Amer >60 >60 mL/min   Anion gap 10 5 - 15  Glucose, capillary  Result Value Ref Range   Glucose-Capillary 272 (H) 65 - 99 mg/dL  Glucose, capillary  Result Value Ref Range   Glucose-Capillary 236 (H) 65 - 99 mg/dL  Glucose, capillary  Result Value Ref Range   Glucose-Capillary 203 (H) 65 - 99 mg/dL  Glucose, capillary  Result Value Ref Range   Glucose-Capillary 204 (H) 65 - 99 mg/dL  Glucose, capillary  Result Value Ref Range   Glucose-Capillary 151 (H) 65 - 99 mg/dL  Glucose, capillary  Result Value Ref Range   Glucose-Capillary 146 (H) 65 - 99 mg/dL  Glucose, capillary  Result Value Ref Range    Glucose-Capillary 158 (H) 65 - 99 mg/dL   Comment 1 Notify RN    Comment 2 Document in Chart   Glucose, capillary  Result Value Ref Range   Glucose-Capillary 181 (H) 65 - 99 mg/dL  Basic metabolic panel  Result Value Ref Range   Sodium 134 (L) 135 - 145 mmol/L   Potassium 4.4  3.5 - 5.1 mmol/L   Chloride 105 101 - 111 mmol/L   CO2 21 (L) 22 - 32 mmol/L   Glucose, Bld 317 (H) 65 - 99 mg/dL   BUN 28 (H) 6 - 20 mg/dL   Creatinine, Ser 1.04 0.61 - 1.24 mg/dL   Calcium 8.4 (L) 8.9 - 10.3 mg/dL   GFR calc non Af Amer >60 >60 mL/min   GFR calc Af Amer >60 >60 mL/min   Anion gap 8 5 - 15  Glucose, capillary  Result Value Ref Range   Glucose-Capillary 267 (H) 65 - 99 mg/dL  Glucose, capillary  Result Value Ref Range   Glucose-Capillary 327 (H) 65 - 99 mg/dL   Comment 1 Notify RN   Glucose, capillary  Result Value Ref Range   Glucose-Capillary 281 (H) 65 - 99 mg/dL  Glucose, capillary  Result Value Ref Range   Glucose-Capillary 287 (H) 65 - 99 mg/dL  Glucose, capillary  Result Value Ref Range   Glucose-Capillary 254 (H) 65 - 99 mg/dL   Comment 1 Notify RN    Comment 2 Document in Chart   Glucose, capillary  Result Value Ref Range   Glucose-Capillary 265 (H) 65 - 99 mg/dL   Comment 1 Notify RN    Comment 2 Document in Chart   Basic metabolic panel  Result Value Ref Range   Sodium 130 (L) 135 - 145 mmol/L   Potassium 3.9 3.5 - 5.1 mmol/L   Chloride 99 (L) 101 - 111 mmol/L   CO2 23 22 - 32 mmol/L   Glucose, Bld 311 (H) 65 - 99 mg/dL   BUN 25 (H) 6 - 20 mg/dL   Creatinine, Ser 1.07 0.61 - 1.24 mg/dL   Calcium 8.6 (L) 8.9 - 10.3 mg/dL   GFR calc non Af Amer >60 >60 mL/min   GFR calc Af Amer >60 >60 mL/min   Anion gap 8 5 - 15  Glucose, capillary  Result Value Ref Range   Glucose-Capillary 234 (H) 65 - 99 mg/dL  Glucose, capillary  Result Value Ref Range   Glucose-Capillary 237 (H) 65 - 99 mg/dL   Comment 1 Notify RN    Comment 2 Document in Chart   CBG monitoring, ED    Result Value Ref Range   Glucose-Capillary >600 (HH) 65 - 99 mg/dL  POC CBG, ED  Result Value Ref Range   Glucose-Capillary >600 (HH) 65 - 99 mg/dL  POC CBG, ED  Result Value Ref Range   Glucose-Capillary >600 (HH) 65 - 99 mg/dL  POC CBG, ED  Result Value Ref Range   Glucose-Capillary >600 (HH) 65 - 99 mg/dL  POC CBG, ED  Result Value Ref Range   Glucose-Capillary 525 (HH) 65 - 99 mg/dL  POC CBG, ED  Result Value Ref Range   Glucose-Capillary 409 (H) 65 - 99 mg/dL  POC CBG, ED  Result Value Ref Range   Glucose-Capillary 345 (H) 65 - 99 mg/dL    Patient claims compliance with diabetes medication. No obvious side effects. Reports no substantial low sugar spells. Most numbers are generally in good range when checked fasting. Generally does not miss a dose of medication. Watching diabetic diet closely In the evening , left foot the worse, gets to hurytign,, burns and hurts in the eve    No numbness or tingling   Patient continues to take lipid medication regularly. No obvious side effects from it. Generally does not miss a dose. Prior blood work results are reviewed  with patient. Patient continues to work on fat intake in diet   Review of Systems  Constitutional: Negative for activity change, appetite change and fever.  HENT: Negative for congestion and rhinorrhea.   Eyes: Negative for discharge.  Respiratory: Negative for cough and wheezing.   Cardiovascular: Negative for chest pain.  Gastrointestinal: Negative for abdominal pain, blood in stool and vomiting.  Genitourinary: Negative for difficulty urinating and frequency.  Musculoskeletal: Negative for neck pain.  Skin: Negative for rash.  Allergic/Immunologic: Negative for environmental allergies and food allergies.  Neurological: Negative for weakness and headaches.  Psychiatric/Behavioral: Negative for agitation.  All other systems reviewed and are negative.      Objective:   Physical Exam  Constitutional: He  appears well-developed and well-nourished.  HENT:  Head: Normocephalic and atraumatic.  Right Ear: External ear normal.  Left Ear: External ear normal.  Nose: Nose normal.  Mouth/Throat: Oropharynx is clear and moist.  Eyes: Right eye exhibits no discharge. Left eye exhibits no discharge. No scleral icterus.  Neck: Normal range of motion. Neck supple. No thyromegaly present.  Cardiovascular: Normal rate, regular rhythm and normal heart sounds.  No murmur heard. Pulmonary/Chest: Effort normal and breath sounds normal. No respiratory distress. He has no wheezes.  Abdominal: Soft. Bowel sounds are normal. He exhibits no distension and no mass. There is no tenderness.  Genitourinary: Penis normal.  Musculoskeletal: Normal range of motion. He exhibits no edema.  Lymphadenopathy:    He has no cervical adenopathy.  Neurological: He is alert. He exhibits normal muscle tone. Coordination normal.  Skin: Skin is warm and dry. No erythema.  Psychiatric: He has a normal mood and affect. His behavior is normal. Judgment normal.  Vitals reviewed.         Assessment & Plan:  Impression wellness exam.  Up-to-date on colonoscopy.  Diet discussed.  Exercise discussed.  2.  Insomnia ongoing with need for meds discussed to maintain  3.  Hypertension good control on current regimen discussed will maintain same dose patient is taking currently  4.  Type 2 diabetes control improving.  Now off insulin.  Handling well.  Sugars much improved  5.  Hyperlipidemia prior blood work reviewed to maintain same dose of meds rationale discussed  Follow-up in 6 months diet exercise discussed prescriptions written further recommendations based on blood work results

## 2017-08-21 ENCOUNTER — Telehealth: Payer: Self-pay | Admitting: Family Medicine

## 2017-08-21 DIAGNOSIS — N183 Chronic kidney disease, stage 3 (moderate): Secondary | ICD-10-CM | POA: Diagnosis not present

## 2017-08-21 DIAGNOSIS — Z794 Long term (current) use of insulin: Secondary | ICD-10-CM | POA: Diagnosis not present

## 2017-08-21 DIAGNOSIS — E785 Hyperlipidemia, unspecified: Secondary | ICD-10-CM | POA: Diagnosis not present

## 2017-08-21 DIAGNOSIS — Z0000 Encounter for general adult medical examination without abnormal findings: Secondary | ICD-10-CM | POA: Diagnosis not present

## 2017-08-21 DIAGNOSIS — I2510 Atherosclerotic heart disease of native coronary artery without angina pectoris: Secondary | ICD-10-CM | POA: Diagnosis not present

## 2017-08-21 DIAGNOSIS — I10 Essential (primary) hypertension: Secondary | ICD-10-CM | POA: Diagnosis not present

## 2017-08-21 DIAGNOSIS — I129 Hypertensive chronic kidney disease with stage 1 through stage 4 chronic kidney disease, or unspecified chronic kidney disease: Secondary | ICD-10-CM | POA: Diagnosis not present

## 2017-08-21 DIAGNOSIS — E7801 Familial hypercholesterolemia: Secondary | ICD-10-CM | POA: Diagnosis not present

## 2017-08-21 DIAGNOSIS — E1122 Type 2 diabetes mellitus with diabetic chronic kidney disease: Secondary | ICD-10-CM | POA: Diagnosis not present

## 2017-08-21 DIAGNOSIS — E1151 Type 2 diabetes mellitus with diabetic peripheral angiopathy without gangrene: Secondary | ICD-10-CM | POA: Diagnosis not present

## 2017-08-21 DIAGNOSIS — E118 Type 2 diabetes mellitus with unspecified complications: Secondary | ICD-10-CM | POA: Diagnosis not present

## 2017-08-21 DIAGNOSIS — E1110 Type 2 diabetes mellitus with ketoacidosis without coma: Secondary | ICD-10-CM | POA: Diagnosis not present

## 2017-08-21 NOTE — Telephone Encounter (Signed)
[name] with [facility] is calling to let Dr. Richardson Landry know that she discharged [name] from home health today.  He is doing great.

## 2017-08-22 ENCOUNTER — Encounter: Payer: Self-pay | Admitting: Family Medicine

## 2017-08-22 LAB — LIPID PANEL
Chol/HDL Ratio: 2.9 ratio (ref 0.0–5.0)
Cholesterol, Total: 95 mg/dL — ABNORMAL LOW (ref 100–199)
HDL: 33 mg/dL — ABNORMAL LOW (ref >39)
LDL Calculated: 45 mg/dL (ref 0–99)
Triglycerides: 84 mg/dL (ref 0–149)
VLDL Cholesterol Cal: 17 mg/dL (ref 5–40)

## 2017-08-22 LAB — BASIC METABOLIC PANEL
BUN/Creatinine Ratio: 14 (ref 10–24)
BUN: 15 mg/dL (ref 8–27)
CO2: 24 mmol/L (ref 20–29)
Calcium: 9.8 mg/dL (ref 8.6–10.2)
Chloride: 102 mmol/L (ref 96–106)
Creatinine, Ser: 1.04 mg/dL (ref 0.76–1.27)
GFR calc Af Amer: 79 mL/min/{1.73_m2} (ref >59)
GFR calc non Af Amer: 68 mL/min/{1.73_m2} (ref >59)
Glucose: 97 mg/dL (ref 65–99)
Potassium: 5 mmol/L (ref 3.5–5.2)
Sodium: 141 mmol/L (ref 134–144)

## 2017-08-22 LAB — HEPATIC FUNCTION PANEL
ALT: 19 IU/L (ref 0–44)
AST: 17 IU/L (ref 0–40)
Albumin: 4.2 g/dL (ref 3.5–4.8)
Alkaline Phosphatase: 83 IU/L (ref 39–117)
Bilirubin Total: 0.4 mg/dL (ref 0.0–1.2)
Bilirubin, Direct: 0.13 mg/dL (ref 0.00–0.40)
Total Protein: 6.5 g/dL (ref 6.0–8.5)

## 2017-08-22 LAB — HEMOGLOBIN A1C
Est. average glucose Bld gHb Est-mCnc: 143 mg/dL
Hgb A1c MFr Bld: 6.6 % — ABNORMAL HIGH (ref 4.8–5.6)

## 2017-09-16 DIAGNOSIS — I739 Peripheral vascular disease, unspecified: Secondary | ICD-10-CM | POA: Diagnosis not present

## 2017-09-16 DIAGNOSIS — B351 Tinea unguium: Secondary | ICD-10-CM | POA: Diagnosis not present

## 2017-09-16 DIAGNOSIS — M79674 Pain in right toe(s): Secondary | ICD-10-CM | POA: Diagnosis not present

## 2017-09-16 DIAGNOSIS — M79675 Pain in left toe(s): Secondary | ICD-10-CM | POA: Diagnosis not present

## 2017-10-02 ENCOUNTER — Ambulatory Visit (INDEPENDENT_AMBULATORY_CARE_PROVIDER_SITE_OTHER): Payer: Medicare Other | Admitting: Vascular Surgery

## 2017-10-02 ENCOUNTER — Other Ambulatory Visit: Payer: Self-pay

## 2017-10-02 ENCOUNTER — Encounter: Payer: Self-pay | Admitting: Vascular Surgery

## 2017-10-02 ENCOUNTER — Ambulatory Visit (HOSPITAL_COMMUNITY)
Admission: RE | Admit: 2017-10-02 | Discharge: 2017-10-02 | Disposition: A | Discharge status: Home / Self Care | Payer: Medicare Other | Source: Ambulatory Visit | Attending: Vascular Surgery | Admitting: Vascular Surgery

## 2017-10-02 VITALS — BP 129/83 | HR 60 | Temp 97.0°F | Resp 20 | Ht 70.0 in | Wt 188.0 lb

## 2017-10-02 DIAGNOSIS — I714 Abdominal aortic aneurysm, without rupture, unspecified: Secondary | ICD-10-CM

## 2017-10-02 DIAGNOSIS — I10 Essential (primary) hypertension: Secondary | ICD-10-CM | POA: Diagnosis not present

## 2017-10-02 DIAGNOSIS — E785 Hyperlipidemia, unspecified: Secondary | ICD-10-CM | POA: Insufficient documentation

## 2017-10-02 DIAGNOSIS — I739 Peripheral vascular disease, unspecified: Secondary | ICD-10-CM

## 2017-10-02 DIAGNOSIS — E119 Type 2 diabetes mellitus without complications: Secondary | ICD-10-CM | POA: Insufficient documentation

## 2017-10-02 NOTE — Progress Notes (Signed)
Patient name: [name] MRN: [id number] DOB: 1940-08-22 Sex: male  REASON FOR VISIT:   Follow-up of abdominal aortic aneurysm and peripheral vascular disease.  HPI:   [name] is a pleasant 77 y.o. male who I last saw on 01/16/2017.  At the time of his last visit the maximum diameter of his infrarenal aorta was 4.6 cm.  The right common iliac artery measured 1.5 cm in maximum diameter in the left common iliac artery 1.7 cm in diameter.  ABI at that time was 99% on the right.  On the left ABI was 56%.  Since I saw him last he apparently was diagnosed with diabetes and was on insulin initially but is now off of that.  He is really worked on his nutrition is made excellent progress with this.  He does continue to have left calf claudication.  His symptoms are brought on by ambulation and relieved with rest.  They occur at 50-100 yards.  There are no other aggravating or alleviating factors.  He denies any history of rest pain or nonhealing ulcers.  He denies any abdominal pain or back pain.  Past Medical History:  Diagnosis Date  . Abdominal aortic aneurysm (HCC)    3.5 cm in 2011  . Adenomatous polyps   . Arteriosclerotic cardiovascular disease (ASCVD) 2006   CABG in 2006; normal EF  . CAD (coronary artery disease)   . Carotid stenosis   . 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 Degenerative joint disease of spine 2011   Lumbosacral and cervical; ant. discectomy, fusion and plate in 5916  . DIABETES MELLITUS, TYPE II 01/31/2010  . Diverticulosis   . ED (erectile dysfunction)   . Hyperlipidemia   . Hypertension    LVH  . Insomnia   . Neuropathy   . Peripheral vascular disease ([place])    Left femoral art. stent; bilateral renal art. stents-2007; ABIs-nl right; 0.80 left in 5/06; angio in 2007-95% bilat. renal; diffuse aortoiliac ASCVD +ulceration;     . Sinus headache   . Stroke ([place])   .  Tobacco abuse, in remission    [id number]    Family History  Problem Relation Age of Onset  . Stroke Father   . Pulmonary embolism Mother        Following hip fracture  . Colon cancer Brother   . Hypertension Brother     SOCIAL HISTORY: Social History   Tobacco Use  . Smoking status: Former Smoker    Packs/day: 1.00    Years: 50.00    Pack years: 50.00    Types: Cigarettes    Start date: 05/10/1952    Last attempt to quit: 10/18/2003    Years since quitting: 13.9  . Smokeless tobacco: Never Used  . Tobacco comment: 50 pack years; quit in 2005   Substance Use Topics  . Alcohol use: No    Alcohol/week: 0.0 oz    Allergies  Allergen Reactions  . Lipitor [Atorvastatin] Other (See Comments)    Side effects-muscle aches    Current Outpatient Medications  Medication Sig Dispense Refill  . ALPRAZolam (XANAX) 1 MG tablet TAKE 1 TABLET BY MOUTH AT BEDTIME 30 tablet 5  . blood glucose meter kit and supplies KIT Dispense based on patient and insurance preference. Use up to four times daily to check your blood sugar (ICD-10 code E11.9) 1 each 0  . clopidogrel (PLAVIX)  75 MG tablet Take 1 tablet (75 mg total) by mouth daily. 30 tablet 5  . lisinopril (PRINIVIL,ZESTRIL) 20 MG tablet Take one tablet qam and one half tablet in the evening (Patient taking differently: 30 mg. Take one tablet qam and one half tablet in the evening) 45 tablet 5  . Multiple Vitamin (MULTIVITAMIN) tablet Take 1 tablet by mouth daily.      . nortriptyline (PAMELOR) 25 MG capsule Take 1 capsule (25 mg total) by mouth at bedtime. 30 capsule 5  . Omega-3 Fatty Acids (FISH OIL) 1000 MG CAPS Take 1 capsule by mouth daily.      . simvastatin (ZOCOR) 80 MG tablet Take 1 tablet (80 mg total) by mouth daily. 30 tablet 5  . temazepam (RESTORIL) 30 MG capsule Take 1 capsule (30 mg total) by mouth at bedtime. 30 capsule 5   No current facility-administered medications for this visit.     REVIEW OF SYSTEMS:  _0   denotes positive finding, _1  denotes negative finding Cardiac  Comments:  Chest pain or chest pressure:    Shortness of breath upon exertion:    Short of breath when lying flat:    Irregular heart rhythm:        Vascular    Pain in calf, thigh, or hip brought on by ambulation: x  left calf  Pain in feet at night that wakes you up from your sleep:     Blood clot in your veins:    Leg swelling:         Pulmonary    Oxygen at home:    Productive cough:     Wheezing:         Neurologic    Sudden weakness in arms or legs:     Sudden numbness in arms or legs:     Sudden onset of difficulty speaking or slurred speech:    Temporary loss of vision in one eye:     Problems with dizziness:         Gastrointestinal    Blood in stool:     Vomited blood:         Genitourinary    Burning when urinating:     Blood in urine:        Psychiatric    Major depression:         Hematologic    Bleeding problems:    Problems with blood clotting too easily:        Skin    Rashes or ulcers:        Constitutional    Fever or chills:     PHYSICAL EXAM:   Vitals:   10/02/17 0938  BP: 129/83  Pulse: 60  Resp: 20  Temp: (!) 97 F (36.1 C)  TempSrc: Oral  SpO2: 99%  Weight: 188 lb (85.3 kg)  Height: _2  (1.778 m)    GENERAL: The patient is a well-nourished male, in no acute distress. The vital signs are documented above. CARDIAC: There is a regular rate and rhythm.  VASCULAR: I do not detect carotid bruits. He has palpable femoral pulses.  I cannot palpate popliteal or pedal pulses. He has no significant lower extremity swelling. PULMONARY: There is good air exchange bilaterally without wheezing or rales. ABDOMEN: Soft and non-tender with normal pitched bowel sounds.  His aneurysm is palpable and nontender. MUSCULOSKELETAL: There are no major deformities or cyanosis. NEUROLOGIC: No focal weakness or paresthesias are detected. SKIN: There are no ulcers or rashes  noted. PSYCHIATRIC: The patient has a normal affect.  DATA:    DUPLEX ABDOMINAL AORTA: I have independently interpreted his duplex of the abdominal aorta.  The maximum diameter of his aneurysm is 5 cm.  The right common iliac artery measures 1.4 cm in maximum diameter in the left common iliac artery measures 1.4 cm in maximum diameter.  CT ABDOMEN: I did review his CT the abdomen that was done on 06/18/2017.  This also showed that the aneurysm at that time was 5 cm.    MEDICAL ISSUES:   ABDOMINAL AORTIC ANEURYSM: His aneurysm has enlarged slightly to 5 cm.  I explained that in a normal risk patient we would consider elective repair at 5.5 cm.  Based on the CT scan that was done in February, he may not be a candidate for endovascular repair if the aneurysm continues to enlarge.  He has bilateral renal stents and a very short neck.  He also has significant calcific disease in his iliac arteries.  Fortunately he is not a smoker.  I ordered a follow-up ultrasound in 6 months and I will see him back at that time.  He knows to call sooner if he has problems.  PERIPHERAL VASCULAR DISEASE: He does have evidence of infrainguinal arterial occlusive disease on the left.  His symptoms however are stable.  I would not recommend an aggressive approach to this unless he developed disabling claudication or rest pain.  Given his newly diagnosed diabetes we did discuss the importance of keeping close eye on his feet.  We also had a long discussion about the importance of nutrition which he seems very interested in.  I have ordered follow-up ABIs when I see him back in 6 months.  He knows to call sooner if he has problems.  Deitra Mayo [facility] [phone]

## 2017-10-23 ENCOUNTER — Ambulatory Visit (INDEPENDENT_AMBULATORY_CARE_PROVIDER_SITE_OTHER): Payer: Medicare Other | Admitting: Family Medicine

## 2017-10-23 ENCOUNTER — Encounter: Payer: Self-pay | Admitting: Family Medicine

## 2017-10-23 VITALS — BP 142/90 | HR 64 | Ht 70.0 in | Wt 186.8 lb

## 2017-10-23 DIAGNOSIS — E118 Type 2 diabetes mellitus with unspecified complications: Secondary | ICD-10-CM

## 2017-10-23 DIAGNOSIS — Z794 Long term (current) use of insulin: Secondary | ICD-10-CM | POA: Diagnosis not present

## 2017-10-23 DIAGNOSIS — I10 Essential (primary) hypertension: Secondary | ICD-10-CM | POA: Diagnosis not present

## 2017-10-23 DIAGNOSIS — F5101 Primary insomnia: Secondary | ICD-10-CM

## 2017-10-23 DIAGNOSIS — E785 Hyperlipidemia, unspecified: Secondary | ICD-10-CM | POA: Diagnosis not present

## 2017-10-23 MED ORDER — LISINOPRIL 20 MG PO TABS: 20.0000 mg | ORAL_TABLET | Freq: Two times a day (BID) | ORAL | 5 refills | Status: DC

## 2017-10-23 MED ORDER — CLOPIDOGREL BISULFATE 75 MG PO TABS: 75.0000 mg | ORAL_TABLET | Freq: Every day | ORAL | 5 refills | Status: DC

## 2017-10-23 MED ORDER — SIMVASTATIN 80 MG PO TABS: 80.0000 mg | ORAL_TABLET | Freq: Every day | ORAL | 5 refills | Status: DC

## 2017-10-23 MED ORDER — NORTRIPTYLINE HCL 25 MG PO CAPS: 25.0000 mg | ORAL_CAPSULE | Freq: Every day | ORAL | 5 refills | Status: DC

## 2017-10-23 NOTE — Progress Notes (Signed)
Subjective:    Patient ID: [name], male    DOB: [birth year], 77 y.o.   MRN: [id number]  Diabetes  He presents for his follow-up diabetic visit. He has type 2 diabetes mellitus. His disease course has been stable. There are no hypoglycemic associated symptoms. There are no diabetic associated symptoms. His weight is decreasing steadily. He never (does do work around his home, states he will start working out soon) participates in exercise.   Results for orders placed or performed in visit on 08/19/17  Lipid panel  Result Value Ref Range   Cholesterol, Total 95 (L) 100 - 199 mg/dL   Triglycerides 84 0 - 149 mg/dL   HDL 33 (L) >39 mg/dL   VLDL Cholesterol Cal 17 5 - 40 mg/dL   LDL Calculated 45 0 - 99 mg/dL   Chol/HDL Ratio 2.9 0.0 - 5.0 ratio  Hepatic function panel  Result Value Ref Range   Total Protein 6.5 6.0 - 8.5 g/dL   Albumin 4.2 3.5 - 4.8 g/dL   Bilirubin Total 0.4 0.0 - 1.2 mg/dL   Bilirubin, Direct 0.13 0.00 - 0.40 mg/dL   Alkaline Phosphatase 83 39 - 117 IU/L   AST 17 0 - 40 IU/L   ALT 19 0 - 44 IU/L  Basic metabolic panel  Result Value Ref Range   Glucose 97 65 - 99 mg/dL   BUN 15 8 - 27 mg/dL   Creatinine, Ser 1.04 0.76 - 1.27 mg/dL   GFR calc non Af Amer 68 >59 mL/min/1.73   GFR calc Af Amer 79 >59 mL/min/1.73   BUN/Creatinine Ratio 14 10 - 24   Sodium 141 134 - 144 mmol/L   Potassium 5.0 3.5 - 5.2 mmol/L   Chloride 102 96 - 106 mmol/L   CO2 24 20 - 29 mmol/L   Calcium 9.8 8.6 - 10.2 mg/dL  Hemoglobin A1c  Result Value Ref Range   Hgb A1c MFr Bld 6.6 (H) 4.8 - 5.6 %   Est. average glucose Bld gHb Est-mCnc 143 mg/dL   95 to 104  Doing far amnt of working and exercise    Patient claims compliance with diabetes medication. No obvious side effects. Reports no substantial low sugar spells. Most numbers are generally in good range when checked fasting. Generally does not miss a dose of medication. Watching diabetic diet closely  Blood pressure  medicine and blood pressure levels reviewed today with patient. Compliant with blood pressure medicine. States does not miss a dose. No obvious side effects. Blood pressure generally good when checked elsewhere. Watching salt intake.   Patient continues to take lipid medication regularly. No obvious side effects from it. Generally does not miss a dose. Prior blood work results are reviewed with patient. Patient continues to work on fat intake in diet   Review of Systems No headache, no major weight loss or weight gain, no chest pain no back pain abdominal pain no change in bowel habits complete ROS otherwise negative     Objective:   Physical Exam Alert and oriented, vitals reviewed and stable, NAD ENT-TM's and ext canals WNL bilat via otoscopic exam Soft palate, tonsils and post pharynx WNL via oropharyngeal exam Neck-symmetric, no masses; thyroid nonpalpable and nontender Pulmonary-no tachypnea or accessory muscle use; Clear without wheezes via auscultation Card--no abnrml murmurs, rhythm reg and rate WNL Carotid pulses symmetric, without bruits         Assessment & Plan:  1 type 2 diabetes.  Recent A1c good.  Patient remains on same approach.  Diet exercise discussed  2.  Hypertension.  Somewhat suboptimum.  Patient concerned because aortic aneurysm enlargement.  Would like to go back to higher dose of the lisinopril we shall do this  3.  Hyperlipidemia prior blood work reviewed numbers reviewed compliance discussed  4 insomnia.  Ongoing with ongoing need for meds meds to maintain  Greater than 50% of this 25 minute face to face visit was spent in counseling and discussion and coordination of care regarding the above diagnosis/diagnosies Follow-up in 4 months medication refill

## 2017-10-24 ENCOUNTER — Other Ambulatory Visit: Payer: Self-pay

## 2017-10-24 DIAGNOSIS — I739 Peripheral vascular disease, unspecified: Secondary | ICD-10-CM

## 2017-10-24 DIAGNOSIS — I714 Abdominal aortic aneurysm, without rupture, unspecified: Secondary | ICD-10-CM

## 2017-11-25 DIAGNOSIS — M79675 Pain in left toe(s): Secondary | ICD-10-CM | POA: Diagnosis not present

## 2017-11-25 DIAGNOSIS — B351 Tinea unguium: Secondary | ICD-10-CM | POA: Diagnosis not present

## 2017-11-25 DIAGNOSIS — I739 Peripheral vascular disease, unspecified: Secondary | ICD-10-CM | POA: Diagnosis not present

## 2017-11-25 DIAGNOSIS — M79674 Pain in right toe(s): Secondary | ICD-10-CM | POA: Diagnosis not present

## 2018-01-07 ENCOUNTER — Other Ambulatory Visit: Payer: Self-pay | Admitting: Family Medicine

## 2018-01-12 ENCOUNTER — Other Ambulatory Visit: Payer: Self-pay | Admitting: Family Medicine

## 2018-01-14 ENCOUNTER — Other Ambulatory Visit: Payer: Self-pay | Admitting: *Deleted

## 2018-01-14 NOTE — Telephone Encounter (Signed)
Six mo worth both 

## 2018-01-20 DIAGNOSIS — S61412A Laceration without foreign body of left hand, initial encounter: Secondary | ICD-10-CM | POA: Diagnosis not present

## 2018-01-20 DIAGNOSIS — Z23 Encounter for immunization: Secondary | ICD-10-CM | POA: Diagnosis not present

## 2018-02-03 DIAGNOSIS — M79675 Pain in left toe(s): Secondary | ICD-10-CM | POA: Diagnosis not present

## 2018-02-03 DIAGNOSIS — M79674 Pain in right toe(s): Secondary | ICD-10-CM | POA: Diagnosis not present

## 2018-02-03 DIAGNOSIS — I739 Peripheral vascular disease, unspecified: Secondary | ICD-10-CM | POA: Diagnosis not present

## 2018-02-03 DIAGNOSIS — B351 Tinea unguium: Secondary | ICD-10-CM | POA: Diagnosis not present

## 2018-02-07 ENCOUNTER — Other Ambulatory Visit: Payer: Self-pay | Admitting: Family Medicine

## 2018-02-18 ENCOUNTER — Ambulatory Visit (INDEPENDENT_AMBULATORY_CARE_PROVIDER_SITE_OTHER): Payer: Medicare Other | Admitting: Family Medicine

## 2018-02-18 ENCOUNTER — Encounter: Payer: Self-pay | Admitting: Family Medicine

## 2018-02-18 VITALS — BP 136/84 | Ht 70.0 in | Wt 185.0 lb

## 2018-02-18 DIAGNOSIS — Z794 Long term (current) use of insulin: Secondary | ICD-10-CM | POA: Diagnosis not present

## 2018-02-18 DIAGNOSIS — E118 Type 2 diabetes mellitus with unspecified complications: Secondary | ICD-10-CM | POA: Diagnosis not present

## 2018-02-18 DIAGNOSIS — Z23 Encounter for immunization: Secondary | ICD-10-CM | POA: Diagnosis not present

## 2018-02-18 DIAGNOSIS — E785 Hyperlipidemia, unspecified: Secondary | ICD-10-CM | POA: Diagnosis not present

## 2018-02-18 DIAGNOSIS — I10 Essential (primary) hypertension: Secondary | ICD-10-CM | POA: Diagnosis not present

## 2018-02-18 LAB — POCT GLYCOSYLATED HEMOGLOBIN (HGB A1C): Hemoglobin A1C: 5.2 % (ref 4.0–5.6)

## 2018-02-18 MED ORDER — SIMVASTATIN 80 MG PO TABS: 80.0000 mg | ORAL_TABLET | Freq: Every day | ORAL | 5 refills | Status: DC

## 2018-02-18 MED ORDER — CLOPIDOGREL BISULFATE 75 MG PO TABS: 75.0000 mg | ORAL_TABLET | Freq: Every day | ORAL | 5 refills | Status: DC

## 2018-02-18 MED ORDER — NORTRIPTYLINE HCL 25 MG PO CAPS: 25.0000 mg | ORAL_CAPSULE | Freq: Every day | ORAL | 5 refills | Status: DC

## 2018-02-18 MED ORDER — LISINOPRIL 20 MG PO TABS: 20.0000 mg | ORAL_TABLET | Freq: Two times a day (BID) | ORAL | 5 refills | Status: DC

## 2018-02-18 NOTE — Progress Notes (Signed)
   Subjective:    Patient ID: [name], male    DOB: Sep 15, 1940, 77 y.o.   MRN: [id number]  Diabetes  He presents for his follow-up diabetic visit. He has type 2 diabetes mellitus. Risk factors for coronary artery disease include dyslipidemia, diabetes mellitus and hypertension. Current diabetic treatment includes diet. He is compliant with treatment all of the time. His weight is stable. He is following a diabetic diet. He has not had a previous visit with a dietitian. He sees a podiatrist.Eye exam current: appt this month.   Results for orders placed or performed in visit on 02/18/18  POCT glycosylated hemoglobin (Hb A1C)  Result Value Ref Range   Hemoglobin A1C 5.2 4.0 - 5.6 %   HbA1c POC (<> result, manual entry)     HbA1c, POC (prediabetic range)     HbA1c, POC (controlled diabetic range)     Patient claims compliance with diabetes medication. No obvious side effects. Reports no substantial low sugar spells. Most numbers are generally in good range when checked fasting. Generally does not miss a dose of medication. Watching diabetic diet closely  mostly 80s and 90s mostly good   Blood pressure medicine and blood pressure levels reviewed today with patient. Compliant with blood pressure medicine. States does not miss a dose. No obvious side effects. Blood pressure generally good when checked elsewhere. Watching salt intake.   Patient continues to take lipid medication regularly. No obvious side effects from it. Generally does not miss a dose. Prior blood work results are reviewed with patient. Patient continues to work on fat intake in diet  Diet  ooverall good  Staying active    . Results for orders placed or performed in visit on 02/18/18  POCT glycosylated hemoglobin (Hb A1C)  Result Value Ref Range   Hemoglobin A1C 5.2 4.0 - 5.6 %   HbA1c POC (<> result, manual entry)     HbA1c, POC (prediabetic range)     HbA1c, POC (controlled diabetic range)       Review of  Systems No headache, no major weight loss or weight gain, no chest pain no back pain abdominal pain no change in bowel habits complete ROS otherwise negative     Objective:   Physical Exam  Alert and oriented, vitals reviewed and stable, NAD ENT-TM's and ext canals WNL bilat via otoscopic exam Soft palate, tonsils and post pharynx WNL via oropharyngeal exam Neck-symmetric, no masses; thyroid nonpalpable and nontender Pulmonary-no tachypnea or accessory muscle use; Clear without wheezes via auscultation Card--no abnrml murmurs, rhythm reg and rate WNL Carotid pulses symmetric, without bruits       Assessment & Plan:  Impression type 2 diabetes.  Excellent control.  No hypoglycemic events.  Compliant with diet.  2.  Hypertension blood pressure reviewed and is good to maintain same therapy at this time #3 hyperlipidemia.  Prior blood work reviewed.  Compliance discussed medications refilled  4.  Insomnia ongoing definite need for medication  Flu shot today appropriate blood work medications refilled diet exercise discussed follow-up in 6 months

## 2018-02-20 DIAGNOSIS — E785 Hyperlipidemia, unspecified: Secondary | ICD-10-CM | POA: Diagnosis not present

## 2018-02-20 DIAGNOSIS — E118 Type 2 diabetes mellitus with unspecified complications: Secondary | ICD-10-CM | POA: Diagnosis not present

## 2018-02-20 DIAGNOSIS — Z794 Long term (current) use of insulin: Secondary | ICD-10-CM | POA: Diagnosis not present

## 2018-02-20 DIAGNOSIS — I10 Essential (primary) hypertension: Secondary | ICD-10-CM | POA: Diagnosis not present

## 2018-02-21 LAB — HEPATIC FUNCTION PANEL
ALT: 13 IU/L (ref 0–44)
AST: 16 IU/L (ref 0–40)
Albumin: 4.3 g/dL (ref 3.5–4.8)
Alkaline Phosphatase: 77 IU/L (ref 39–117)
Bilirubin Total: 0.5 mg/dL (ref 0.0–1.2)
Bilirubin, Direct: 0.16 mg/dL (ref 0.00–0.40)
Total Protein: 6.7 g/dL (ref 6.0–8.5)

## 2018-02-21 LAB — BASIC METABOLIC PANEL
BUN/Creatinine Ratio: 17 (ref 10–24)
BUN: 21 mg/dL (ref 8–27)
CO2: 21 mmol/L (ref 20–29)
Calcium: 9.7 mg/dL (ref 8.6–10.2)
Chloride: 100 mmol/L (ref 96–106)
Creatinine, Ser: 1.23 mg/dL (ref 0.76–1.27)
GFR calc Af Amer: 65 mL/min/{1.73_m2} (ref >59)
GFR calc non Af Amer: 56 mL/min/{1.73_m2} — ABNORMAL LOW (ref >59)
Glucose: 105 mg/dL — ABNORMAL HIGH (ref 65–99)
Potassium: 4.5 mmol/L (ref 3.5–5.2)
Sodium: 140 mmol/L (ref 134–144)

## 2018-02-21 LAB — LIPID PANEL
Chol/HDL Ratio: 2.8 ratio (ref 0.0–5.0)
Cholesterol, Total: 99 mg/dL — ABNORMAL LOW (ref 100–199)
HDL: 36 mg/dL — ABNORMAL LOW (ref >39)
LDL Calculated: 46 mg/dL (ref 0–99)
Triglycerides: 86 mg/dL (ref 0–149)
VLDL Cholesterol Cal: 17 mg/dL (ref 5–40)

## 2018-02-24 ENCOUNTER — Encounter: Payer: Self-pay | Admitting: Family Medicine

## 2018-03-04 DIAGNOSIS — H26491 Other secondary cataract, right eye: Secondary | ICD-10-CM | POA: Diagnosis not present

## 2018-03-04 DIAGNOSIS — Z961 Presence of intraocular lens: Secondary | ICD-10-CM | POA: Diagnosis not present

## 2018-03-10 ENCOUNTER — Other Ambulatory Visit: Payer: Self-pay | Admitting: Family Medicine

## 2018-03-11 DIAGNOSIS — L309 Dermatitis, unspecified: Secondary | ICD-10-CM | POA: Diagnosis not present

## 2018-03-11 DIAGNOSIS — L570 Actinic keratosis: Secondary | ICD-10-CM | POA: Diagnosis not present

## 2018-03-11 DIAGNOSIS — Z85828 Personal history of other malignant neoplasm of skin: Secondary | ICD-10-CM | POA: Diagnosis not present

## 2018-03-12 ENCOUNTER — Other Ambulatory Visit: Payer: Self-pay

## 2018-03-12 ENCOUNTER — Ambulatory Visit (INDEPENDENT_AMBULATORY_CARE_PROVIDER_SITE_OTHER)
Admission: RE | Admit: 2018-03-12 | Discharge: 2018-03-12 | Disposition: A | Discharge status: Home / Self Care | Payer: Medicare Other | Source: Ambulatory Visit | Attending: Vascular Surgery | Admitting: Vascular Surgery

## 2018-03-12 ENCOUNTER — Ambulatory Visit (INDEPENDENT_AMBULATORY_CARE_PROVIDER_SITE_OTHER): Payer: Medicare Other | Admitting: Vascular Surgery

## 2018-03-12 ENCOUNTER — Encounter: Payer: Self-pay | Admitting: Vascular Surgery

## 2018-03-12 ENCOUNTER — Ambulatory Visit (HOSPITAL_COMMUNITY)
Admission: RE | Admit: 2018-03-12 | Discharge: 2018-03-12 | Disposition: A | Discharge status: Home / Self Care | Payer: Medicare Other | Source: Ambulatory Visit | Attending: Vascular Surgery | Admitting: Vascular Surgery

## 2018-03-12 VITALS — BP 138/78 | HR 70 | Temp 97.1°F | Resp 18 | Ht 70.0 in | Wt 181.0 lb

## 2018-03-12 DIAGNOSIS — I739 Peripheral vascular disease, unspecified: Secondary | ICD-10-CM | POA: Insufficient documentation

## 2018-03-12 DIAGNOSIS — I714 Abdominal aortic aneurysm, without rupture, unspecified: Secondary | ICD-10-CM

## 2018-03-12 NOTE — Progress Notes (Signed)
Patient name: [name] MRN: [id number] DOB: [birth year] Sex: male  REASON FOR VISIT:   Follow-up of abdominal aortic aneurysm and peripheral vascular disease.  HPI:   [name] is a pleasant 77 y.o. male who I last saw on 10/02/2017.  His aneurysm at that time by duplex was 5 cm.  He also had a previous CT scan in February of this year which showed that the maximum diameter was 5 cm.  In addition he had evidence of infrainguinal arterial occlusive disease on the left with stable claudication symptoms.  He comes in for a six-month follow-up visit.  Since I saw him last he denies any abdominal pain or back pain.  He is not a smoker and his blood pressure is been under good control.  He does have left lower extremity claudication.  He describes pain in his left calf which is brought on by ambulation at a fairly short distance and relieved with rest.  The symptoms have been stable over the last 6 months.  He has no hip or thigh claudication on the left.  He has no right lower extremity symptoms.  He denies any history of rest pain or nonhealing ulcers.  Past Medical History:  Diagnosis Date  . Abdominal aortic aneurysm (HCC)    3.5 cm in 2011  . Adenomatous polyps   . Arteriosclerotic cardiovascular disease (ASCVD) 2006   CABG in 2006; normal EF  . CAD (coronary artery disease)   . Carotid stenosis   . 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 Degenerative joint disease of spine 2011   Lumbosacral and cervical; ant. discectomy, fusion and plate in 1779  . DIABETES MELLITUS, TYPE II 01/31/2010  . Diverticulosis   . ED (erectile dysfunction)   . Hyperlipidemia   . Hypertension    LVH  . Insomnia   . Neuropathy   . Peripheral vascular disease ([place])    Left femoral art. stent; bilateral renal art. stents-2007; ABIs-nl right; 0.80 left in 5/06; angio in 2007-95% bilat. renal; diffuse aortoiliac ASCVD  +ulceration;     . Sinus headache   . Stroke ([place])   . Tobacco abuse, in remission    [id number]    Family History  Problem Relation Age of Onset  . Stroke Father   . Pulmonary embolism Mother        Following hip fracture  . Colon cancer Brother   . Hypertension Brother     SOCIAL HISTORY: Social History   Tobacco Use  . Smoking status: Former Smoker    Packs/day: 1.00    Years: 50.00    Pack years: 50.00    Types: Cigarettes    Start date: 05/10/1952    Last attempt to quit: 10/18/2003    Years since quitting: 14.4  . Smokeless tobacco: Never Used  . Tobacco comment: 50 pack years; quit in 2005   Substance Use Topics  . Alcohol use: No    Alcohol/week: 0.0 standard drinks    Allergies  Allergen Reactions  . Atorvastatin Other (See Comments)    Side effects-muscle aches Other reaction(s): Other (See Comments) Side effects-muscle aches Side effects-muscle aches  . Clopidogrel Other (See Comments)    Pt does not know Pt denies being allergic . Takes Plavix now     Current Outpatient Medications  Medication Sig Dispense Refill  . ALPRAZolam (XANAX) 1 MG tablet TAKE 1 TABLET  BY MOUTH AT BEDTIME. 30 tablet 5  . blood glucose meter kit and supplies KIT Dispense based on patient and insurance preference. Use up to four times daily to check your blood sugar (ICD-10 code E11.9) 1 each 0  . clopidogrel (PLAVIX) 75 MG tablet Take 1 tablet (75 mg total) by mouth daily. 30 tablet 5  . lisinopril (PRINIVIL,ZESTRIL) 20 MG tablet Take 1 tablet (20 mg total) by mouth 2 (two) times daily. 60 tablet 5  . Multiple Vitamin (MULTIVITAMIN) tablet Take 1 tablet by mouth daily.      . nortriptyline (PAMELOR) 25 MG capsule Take 1 capsule (25 mg total) by mouth at bedtime. 30 capsule 5  . Omega-3 Fatty Acids (FISH OIL) 1000 MG CAPS Take 1 capsule by mouth daily.      . simvastatin (ZOCOR) 80 MG tablet Take 1 tablet (80 mg total) by mouth daily. 30 tablet 5  . temazepam (RESTORIL) 30 MG  capsule TAKE 1 CAPSULE BY MOUTH AT BEDTIME. 30 capsule 5   No current facility-administered medications for this visit.     REVIEW OF SYSTEMS:  _0  denotes positive finding, _1  denotes negative finding Cardiac  Comments:  Chest pain or chest pressure:    Shortness of breath upon exertion:    Short of breath when lying flat:    Irregular heart rhythm:        Vascular    Pain in calf, thigh, or hip brought on by ambulation:    Pain in feet at night that wakes you up from your sleep:     Blood clot in your veins:    Leg swelling:         Pulmonary    Oxygen at home:    Productive cough:     Wheezing:         Neurologic    Sudden weakness in arms or legs:     Sudden numbness in arms or legs:     Sudden onset of difficulty speaking or slurred speech:    Temporary loss of vision in one eye:     Problems with dizziness:         Gastrointestinal    Blood in stool:     Vomited blood:         Genitourinary    Burning when urinating:     Blood in urine:        Psychiatric    Major depression:         Hematologic    Bleeding problems:    Problems with blood clotting too easily:        Skin    Rashes or ulcers:        Constitutional    Fever or chills:     PHYSICAL EXAM:   Vitals:   03/12/18 0849  BP: 138/78  Pulse: 70  Resp: 18  Temp: (!) 97.1 F (36.2 C)  TempSrc: Oral  SpO2: 97%  Weight: 181 lb (82.1 kg)  Height: _2  (1.778 m)    GENERAL: The patient is a well-nourished male, in no acute distress. The vital signs are documented above. CARDIAC: There is a regular rate and rhythm.  VASCULAR: I do not detect carotid bruits. On the left side, which is the symptomatic side, he has a palpable femoral pulse.  I cannot palpate popliteal or pedal pulses. On the right side he has a slightly diminished femoral pulse.  I cannot palpate popliteal or pedal pulses. PULMONARY: There is good  air exchange bilaterally without wheezing or rales. ABDOMEN: Soft and  non-tender with normal pitched bowel sounds.  MUSCULOSKELETAL: There are no major deformities or cyanosis. NEUROLOGIC: No focal weakness or paresthesias are detected. SKIN: There are no ulcers or rashes noted. PSYCHIATRIC: The patient has a normal affect.  DATA:    DUPLEX ABDOMINAL AORTA: I have independently interpreted his duplex of the abdominal aorta.  The maximum diameter of his aneurysm is 5 cm.  The right common iliac artery measures 1.7 cm in maximum diameter.  The left common iliac artery measures 1.3 cm in maximum diameter.  ARTERIAL DOPPLER STUDY: I have independently interpreted his arterial Doppler study.  On the left side, which is the symptomatic side, he does have a monophasic dorsalis pedis signal.  There is no posterior tibial signal.  ABI is 43%.  Toe pressure is 43 mmHg.  On the right side he has a triphasic dorsalis pedis and posterior tibial signal with an ABI of 94%.  Toe pressure is 136 mmHg.  MEDICAL ISSUES:   ABDOMINAL AORTIC ANEURYSM: This patient has an asymptomatic 5 cm infrarenal abdominal aortic aneurysm.  He understands we would not consider elective repair unless the aneurysm reached 5.5 cm in maximum diameter.  Fortunately he is not a smoker and his blood pressure is under good control.  If the aneurysm continues to enlarge and reaches this size that we would obviously want a CT scan to help determine if he is a candidate for endovascular graft.  However currently the aneurysm is stable in size.  I would a follow-up duplex scan in 6 months and I will see him back at that time.  PERIPHERAL VASCULAR DISEASE: This patient has infrainguinal arterial occlusive disease on the left.  His ABI on the left is 43% but he has stable claudication.  I have encouraged him to stay as active as possible.  Fortunately he is not a smoker.  I ordered follow-up ABIs in 1 year.  He knows to call sooner if he has problems.  Deitra Mayo [facility]  [facility] [phone]

## 2018-03-19 DIAGNOSIS — H26491 Other secondary cataract, right eye: Secondary | ICD-10-CM | POA: Diagnosis not present

## 2018-04-11 ENCOUNTER — Other Ambulatory Visit: Payer: Self-pay | Admitting: Family Medicine

## 2018-04-21 DIAGNOSIS — B351 Tinea unguium: Secondary | ICD-10-CM | POA: Diagnosis not present

## 2018-04-21 DIAGNOSIS — M79675 Pain in left toe(s): Secondary | ICD-10-CM | POA: Diagnosis not present

## 2018-04-21 DIAGNOSIS — M79674 Pain in right toe(s): Secondary | ICD-10-CM | POA: Diagnosis not present

## 2018-04-21 DIAGNOSIS — I739 Peripheral vascular disease, unspecified: Secondary | ICD-10-CM | POA: Diagnosis not present

## 2018-05-10 ENCOUNTER — Other Ambulatory Visit: Payer: Self-pay | Admitting: Family Medicine

## 2018-05-16 ENCOUNTER — Ambulatory Visit (INDEPENDENT_AMBULATORY_CARE_PROVIDER_SITE_OTHER): Payer: Medicare Other | Admitting: Family Medicine

## 2018-05-16 VITALS — BP 122/82 | Wt 184.8 lb

## 2018-05-16 DIAGNOSIS — L02213 Cutaneous abscess of chest wall: Secondary | ICD-10-CM

## 2018-05-16 MED ORDER — DOXYCYCLINE HYCLATE 100 MG PO TABS: 100.0000 mg | ORAL_TABLET | Freq: Two times a day (BID) | ORAL | 0 refills | Status: DC

## 2018-05-16 NOTE — Progress Notes (Signed)
   Subjective:    Patient ID: [name], male    DOB: 12/01/1940, 78 y.o.   MRN: [id number]  HPI Patient arrives with cyst on chest for 3 years. Patient states he bumped it at the [facility] and thinks it is infected. Blood pressure medicine and blood pressure levels reviewed today with patient. Compliant with blood pressure medicine. States does not miss a dose. No obvious side effects. Blood pressure generally good when checked elsewhere. Watching salt intake.    Review of Systems No headache, no major weight loss or weight gain, no chest pain no back pain abdominal pain no change in bowel habits complete ROS otherwise negative     Objective:   Physical Exam  Alert vitals stable, NAD. Blood pressure good on repeat. HEENT normal. Lungs clear. Heart regular rate and rhythm.  Inflamed sebaceous cyst.  Anterior chest.  Patient was prepped.  Face.  Anesthetized incised.  Potential pus withdrawn.  Drain inserted.  Antibiotics prescribed local measures discussed.      Assessment & Plan:

## 2018-06-30 DIAGNOSIS — M79674 Pain in right toe(s): Secondary | ICD-10-CM | POA: Diagnosis not present

## 2018-06-30 DIAGNOSIS — I739 Peripheral vascular disease, unspecified: Secondary | ICD-10-CM | POA: Diagnosis not present

## 2018-06-30 DIAGNOSIS — B351 Tinea unguium: Secondary | ICD-10-CM | POA: Diagnosis not present

## 2018-06-30 DIAGNOSIS — M79675 Pain in left toe(s): Secondary | ICD-10-CM | POA: Diagnosis not present

## 2018-07-10 ENCOUNTER — Ambulatory Visit (INDEPENDENT_AMBULATORY_CARE_PROVIDER_SITE_OTHER): Payer: Medicare Other | Admitting: Cardiovascular Disease

## 2018-07-10 ENCOUNTER — Other Ambulatory Visit: Payer: Self-pay | Admitting: Family Medicine

## 2018-07-10 ENCOUNTER — Encounter: Payer: Self-pay | Admitting: *Deleted

## 2018-07-10 ENCOUNTER — Encounter: Payer: Self-pay | Admitting: Cardiovascular Disease

## 2018-07-10 VITALS — BP 122/73 | HR 69 | Ht 70.0 in | Wt 183.4 lb

## 2018-07-10 DIAGNOSIS — I714 Abdominal aortic aneurysm, without rupture, unspecified: Secondary | ICD-10-CM

## 2018-07-10 DIAGNOSIS — I10 Essential (primary) hypertension: Secondary | ICD-10-CM | POA: Diagnosis not present

## 2018-07-10 DIAGNOSIS — E785 Hyperlipidemia, unspecified: Secondary | ICD-10-CM | POA: Diagnosis not present

## 2018-07-10 DIAGNOSIS — I6523 Occlusion and stenosis of bilateral carotid arteries: Secondary | ICD-10-CM

## 2018-07-10 DIAGNOSIS — I739 Peripheral vascular disease, unspecified: Secondary | ICD-10-CM | POA: Diagnosis not present

## 2018-07-10 DIAGNOSIS — I25708 Atherosclerosis of coronary artery bypass graft(s), unspecified, with other forms of angina pectoris: Secondary | ICD-10-CM | POA: Diagnosis not present

## 2018-07-10 DIAGNOSIS — Z951 Presence of aortocoronary bypass graft: Secondary | ICD-10-CM | POA: Diagnosis not present

## 2018-07-10 NOTE — Progress Notes (Signed)
CARDIOLOGY CONSULT NOTE  Patient ID: [name] MRN: [id number] DOB/AGE: September 04, 1940 78 y.o.  Admit date: (Not on file) Primary Physician: Mikey Kirschner, MD Referring Physician: Mikey Kirschner, MD  Reason for Consultation: Coronary artery disease.  HPI: [name] is a 78 y.o. male who is being seen today for the evaluation of coronary artery disease at the request of Luking, Grace Bushy, MD.   He underwent CABG in 2006.  He also has a history of peripheral vascular disease with renal artery stenting, abdominal aortic aneurysm, hypertension, TIA, diabetes, and hyperlipidemia.  The patient denies any symptoms of chest pain, palpitations, shortness of breath, lightheadedness, dizziness, leg swelling, orthopnea, PND, and syncope.  ECG performed in the office today which I ordered and personally interpreted demonstrates normal sinus rhythm with old inferoapical and anterior infarct, LAFB, and nonspecific T wave abnormalities.  He exercises at the [facility] 3 days/week.     Allergies  Allergen Reactions  . Atorvastatin Other (See Comments)    Side effects-muscle aches Other reaction(s): Other (See Comments) Side effects-muscle aches Side effects-muscle aches  . Clopidogrel Other (See Comments)    Pt does not know Pt denies being allergic . Takes Plavix now     Current Outpatient Medications  Medication Sig Dispense Refill  . ALPRAZolam (XANAX) 1 MG tablet TAKE 1 TABLET BY MOUTH AT BEDTIME. 30 tablet 5  . blood glucose meter kit and supplies KIT Dispense based on patient and insurance preference. Use up to four times daily to check your blood sugar (ICD-10 code E11.9) 1 each 0  . clopidogrel (PLAVIX) 75 MG tablet Take 1 tablet (75 mg total) by mouth daily. 30 tablet 5  . lisinopril (PRINIVIL,ZESTRIL) 20 MG tablet Take 1 tablet (20 mg total) by mouth 2 (two) times daily. 60 tablet 5  . Multiple Vitamin (MULTIVITAMIN) tablet Take 1 tablet by mouth daily.        . nortriptyline (PAMELOR) 25 MG capsule Take 1 capsule (25 mg total) by mouth at bedtime. 30 capsule 5  . Omega-3 Fatty Acids (FISH OIL) 1000 MG CAPS Take 1 capsule by mouth daily.      . simvastatin (ZOCOR) 80 MG tablet TAKE 1 TABLET BY MOUTH ONCE DAILY. 30 tablet 0  . temazepam (RESTORIL) 30 MG capsule TAKE 1 CAPSULE BY MOUTH AT BEDTIME. 30 capsule 5   No current facility-administered medications for this visit.     Past Medical History:  Diagnosis Date  . Abdominal aortic aneurysm (HCC)    3.5 cm in 2011  . Adenomatous polyps   . Arteriosclerotic cardiovascular disease (ASCVD) 2006   CABG in 2006; normal EF  . CAD (coronary artery disease)   . Carotid stenosis   . 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 Degenerative joint disease of spine 2011   Lumbosacral and cervical; ant. discectomy, fusion and plate in 3094  . DIABETES MELLITUS, TYPE II 01/31/2010  . Diverticulosis   . ED (erectile dysfunction)   . Hyperlipidemia   . Hypertension    LVH  . Insomnia   . Neuropathy   . Peripheral vascular disease ([place])    Left femoral art. stent; bilateral renal art. stents-2007; ABIs-nl right; 0.80 left in 5/06; angio in 2007-95% bilat. renal; diffuse aortoiliac ASCVD +ulceration;     . Sinus headache   . Stroke ([place])   . Tobacco abuse,  in remission    [id number]    Past Surgical History:  Procedure Laterality Date  . ANTERIOR FUSION CERVICAL SPINE  2005   . COLONOSCOPY W/ POLYPECTOMY  2010  . CORONARY ARTERY BYPASS GRAFT  2006   LIMA to LAD; SVG to marginal; SVG to L posterolateral; nondoninant RCA   . LUMBAR SPINE SURGERY  1985    Discectomy and fusion  . MASS EXCISION Left 01/22/2014   Procedure: EXCISION LEFT WRIST MASS;  Surgeon: Mcarthur Rossetti, MD;  Location: [facility];  Service: Orthopedics;  Laterality: Left;    Social History   Socioeconomic History  . Marital status: Married     Spouse name: Not on file  . Number of children: Not on file  . Years of education: Not on file  . Highest education level: Not on file  Occupational History  . Occupation: Retired  Scientific laboratory technician  . Financial resource strain: Not on file  . Food insecurity:    Worry: Not on file    Inability: Not on file  . Transportation needs:    Medical: Not on file    Non-medical: Not on file  Tobacco Use  . Smoking status: Former Smoker    Packs/day: 1.00    Years: 50.00    Pack years: 50.00    Types: Cigarettes    Start date: 05/10/1952    Last attempt to quit: 10/18/2003    Years since quitting: 14.7  . Smokeless tobacco: Never Used  . Tobacco comment: 50 pack years; quit in 2005   Substance and Sexual Activity  . Alcohol use: No    Alcohol/week: 0.0 standard drinks  . Drug use: No  . Sexual activity: Not on file  Lifestyle  . Physical activity:    Days per week: Not on file    Minutes per session: Not on file  . Stress: Not on file  Relationships  . Social connections:    Talks on phone: Not on file    Gets together: Not on file    Attends religious service: Not on file    Active member of club or organization: Not on file    Attends meetings of clubs or organizations: Not on file    Relationship status: Not on file  . Intimate partner violence:    Fear of current or ex partner: Not on file    Emotionally abused: Not on file    Physically abused: Not on file    Forced sexual activity: Not on file  Other Topics Concern  . Not on file  Social History Narrative   Retired; married; does not get regular exercise.      No family history of premature CAD in 1st degree relatives.  Current Meds  Medication Sig  . ALPRAZolam (XANAX) 1 MG tablet TAKE 1 TABLET BY MOUTH AT BEDTIME.  . blood glucose meter kit and supplies KIT Dispense based on patient and insurance preference. Use up to four times daily to check your blood sugar (ICD-10 code E11.9)  . clopidogrel (PLAVIX) 75 MG  tablet Take 1 tablet (75 mg total) by mouth daily.  Marland Kitchen lisinopril (PRINIVIL,ZESTRIL) 20 MG tablet Take 1 tablet (20 mg total) by mouth 2 (two) times daily.  . Multiple Vitamin (MULTIVITAMIN) tablet Take 1 tablet by mouth daily.    . nortriptyline (PAMELOR) 25 MG capsule Take 1 capsule (25 mg total) by mouth at bedtime.  . Omega-3 Fatty Acids (FISH OIL) 1000 MG CAPS Take 1 capsule by mouth  daily.    . simvastatin (ZOCOR) 80 MG tablet TAKE 1 TABLET BY MOUTH ONCE DAILY.  Marland Kitchen temazepam (RESTORIL) 30 MG capsule TAKE 1 CAPSULE BY MOUTH AT BEDTIME.      Review of systems complete and found to be negative unless listed above in HPI    Physical exam Blood pressure 122/73, pulse 69, height 5' 10"  (1.778 m), weight 183 lb 6.4 oz (83.2 kg), SpO2 98 %. General: NAD Neck: No JVD, no thyromegaly or thyroid nodule.  Lungs: Clear to auscultation bilaterally with normal respiratory effort. CV: Nondisplaced PMI. Regular rate and rhythm, normal S1/S2, no S3/S4, no murmur.  No peripheral edema.  No carotid bruit.    Abdomen: Soft, nontender, no distention.  Skin: Intact without lesions or rashes.  Neurologic: Alert and oriented x 3.  Psych: Normal affect. Extremities: No clubbing or cyanosis.  HEENT: Normal.   ECG: Most recent ECG reviewed.   Labs: Lab Results  Component Value Date/Time   K 4.5 02/20/2018 09:08 AM   BUN 21 02/20/2018 09:08 AM   CREATININE 1.23 02/20/2018 09:08 AM   CREATININE 0.97 12/15/2014 01:42 PM   ALT 13 02/20/2018 09:08 AM   HGB 15.3 06/19/2017 03:01 AM     Lipids: Lab Results  Component Value Date/Time   LDLCALC 46 02/20/2018 09:08 AM   CHOL 99 (L) 02/20/2018 09:08 AM   TRIG 86 02/20/2018 09:08 AM   HDL 36 (L) 02/20/2018 09:08 AM        ASSESSMENT AND PLAN:  1.  Coronary artery disease: History of CABG in 2006.  He is on Plavix (presumably for history of TIA), lisinopril, and simvastatin.  He denies anginal symptoms.  As it has been 14 years since bypass surgery,  I will obtain a Kidron for ischemic surveillance.  2.  Abdominal aortic aneurysm: Followed by vascular surgery.  It measures 5 cm.  3.  PAD: Followed by vascular surgery.  He has infrainguinal arterial occlusive disease on the left.  Claudication is stable.  He is on Plavix and simvastatin.  4.  Hypertension: Blood pressure is normal.  No changes to therapy.  5.  Hyperlipidemia: Full lipid panel reviewed above.  LDL at goal, 46 on 02/20/2018.  Continue simvastatin.  6. Carotid artery stenosis: Had 60-79% RICA stenosis and 27-63% LICA stenosis on 9/43/20.  I will repeat Dopplers.  On Plavix and statin.   Disposition: Follow up in 1 year  Signed: Kate Sable, M.D., F.A.C.C.  07/10/2018, 2:45 PM

## 2018-07-10 NOTE — Patient Instructions (Signed)
Medication Instructions:  Continue all current medications.  Labwork:   Testing/Procedures:  Your physician has requested that you have a carotid duplex. This test is an ultrasound of the carotid arteries in your neck. It looks at blood flow through these arteries that supply the brain with blood. Allow one hour for this exam. There are no restrictions or special instructions.  Your physician has requested that you have a lexiscan myoview. For further information please visit [email or web address]. Please follow instruction sheet, as given.  Office will contact with results via phone or letter.    Follow-Up: Your physician wants you to follow up in:  1 year.  You will receive a reminder letter in the mail one-two months in advance.  If you don't receive a letter, please call our office to schedule the follow up appointment   Any Other Special Instructions Will Be Listed Below (If Applicable).  If you need a refill on your cardiac medications before your next appointment, please call your pharmacy.

## 2018-07-10 NOTE — Telephone Encounter (Signed)
Six mo ok 

## 2018-07-10 NOTE — Addendum Note (Signed)
Addended by: Laurine Blazer on: 07/10/2018 03:10 PM   Modules accepted: Orders

## 2018-07-22 ENCOUNTER — Encounter (HOSPITAL_COMMUNITY): Payer: Medicare Other

## 2018-08-19 ENCOUNTER — Other Ambulatory Visit: Payer: Self-pay | Admitting: Family Medicine

## 2018-08-21 ENCOUNTER — Encounter: Payer: Medicare Other | Admitting: Family Medicine

## 2018-08-22 ENCOUNTER — Encounter: Payer: Medicare Other | Admitting: Family Medicine

## 2018-08-26 ENCOUNTER — Telehealth: Payer: Self-pay | Admitting: Cardiovascular Disease

## 2018-08-26 NOTE — Telephone Encounter (Signed)
Called patient to try to reschedule Lexiscan and Carotid.  Patient stated that he does not want to reschedule the [place].  Wants to know if Carotid should be cancelled also.

## 2018-08-26 NOTE — Telephone Encounter (Signed)
Pt would like to [place] and carotid US until late July/August with current pandemic. Pt denies any cardiac symptoms at this time and reminded to call our office if cardiac problems arise. Will forward to schedulers and provider FYI

## 2018-08-27 ENCOUNTER — Telehealth: Payer: Self-pay | Admitting: Cardiovascular Disease

## 2018-08-27 NOTE — Telephone Encounter (Signed)
°  Precert needed for: lexiscan   Location: [facility]    Date:  December 03, 2018

## 2018-08-27 NOTE — Telephone Encounter (Signed)
°  Precert needed for: Carotid   Location: [facility]    Date: November 26, 2018

## 2018-09-29 DIAGNOSIS — I739 Peripheral vascular disease, unspecified: Secondary | ICD-10-CM | POA: Diagnosis not present

## 2018-09-29 DIAGNOSIS — M79674 Pain in right toe(s): Secondary | ICD-10-CM | POA: Diagnosis not present

## 2018-09-29 DIAGNOSIS — M79675 Pain in left toe(s): Secondary | ICD-10-CM | POA: Diagnosis not present

## 2018-09-29 DIAGNOSIS — B351 Tinea unguium: Secondary | ICD-10-CM | POA: Diagnosis not present

## 2018-10-08 ENCOUNTER — Other Ambulatory Visit: Payer: Self-pay | Admitting: Family Medicine

## 2018-10-14 IMAGING — CT CT CHEST W/ CM
2 of 4 series · 13 of 36 positions shown, 16 images · IV contrast (Isovue)
Comparison: 12/22/2014 CT abdomen pelvis

CLINICAL DATA: Syncopal episode, hit left flank left flank pain

EXAM:
CT CHEST, ABDOMEN, AND PELVIS WITH CONTRAST
TECHNIQUE: Multidetector CT imaging of the chest, abdomen and pelvis was
performed following the standard protocol during bolus
administration of intravenous contrast.
CONTRAST:  75mL [id number] IOPAMIDOL ([id number]) INJECTION 61%

[Series 2: cap with · axial · 0.93mm/px · z∈[-497,+103]mm · 10 of 134 slices shown, 13 images]
[im 7/134  mediastinal]
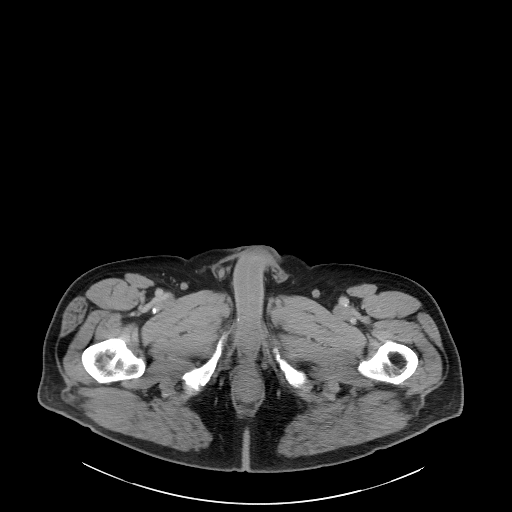
[im 7/134  lung]
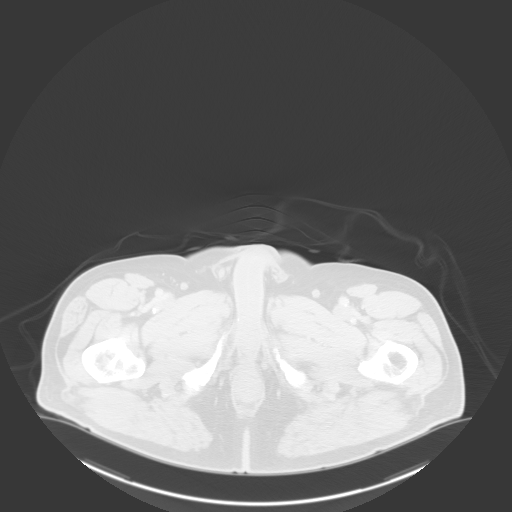
[im 20/134  lung]
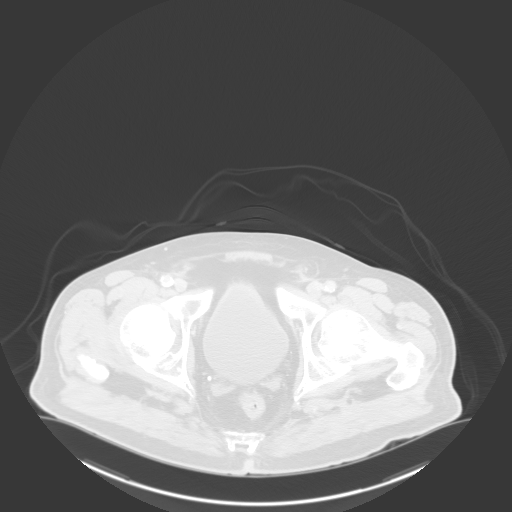
[im 34/134  lung]
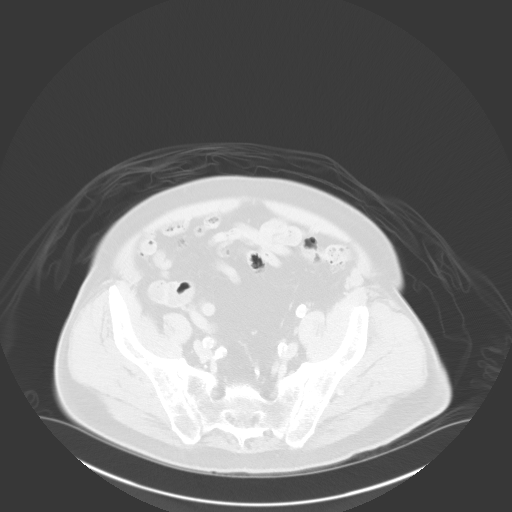
[im 47/134  lung]
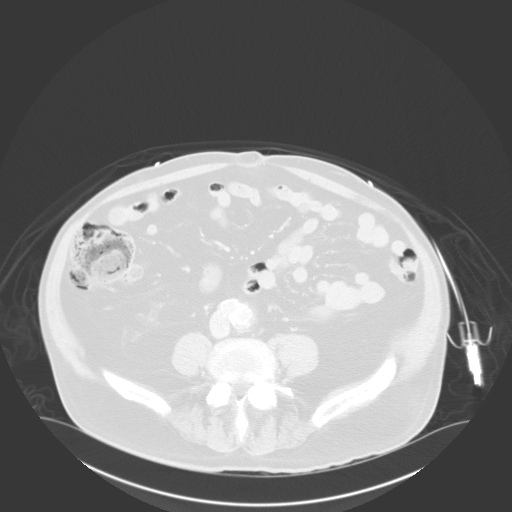
[im 60/134  mediastinal]
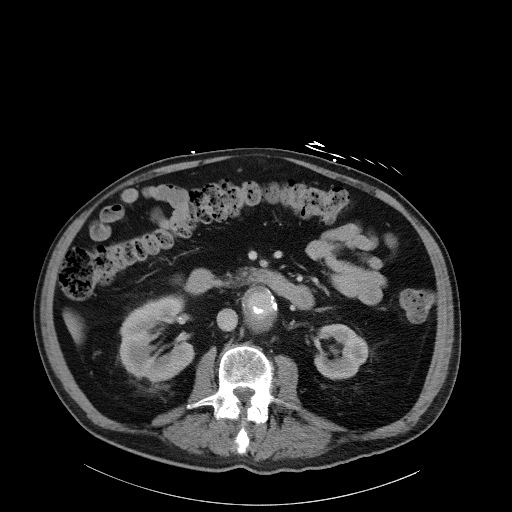
[im 60/134  lung]
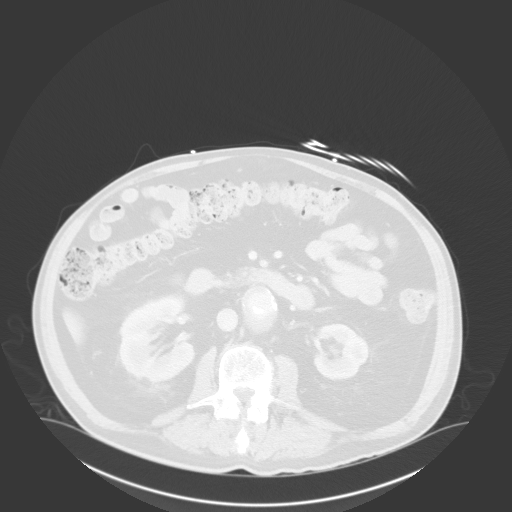
[im 74/134  lung]
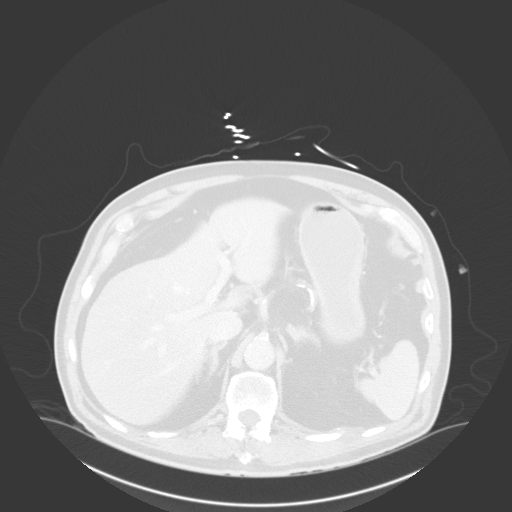
[im 87/134  lung]
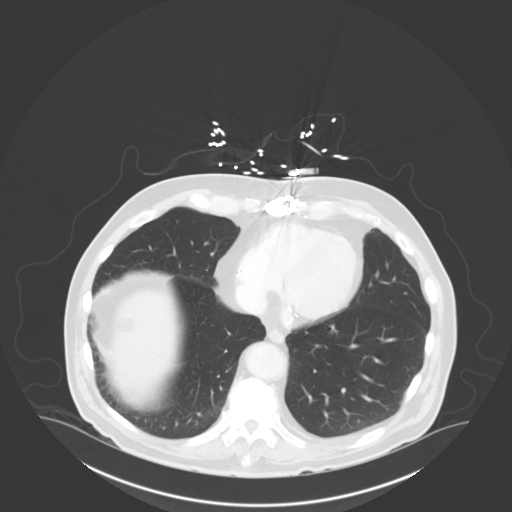
[im 100/134  lung]
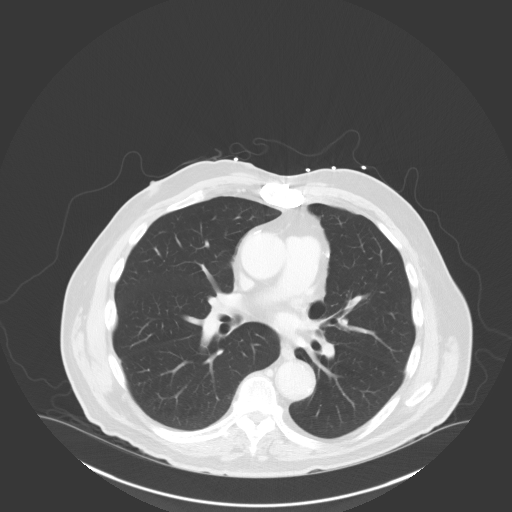
[im 114/134  mediastinal]
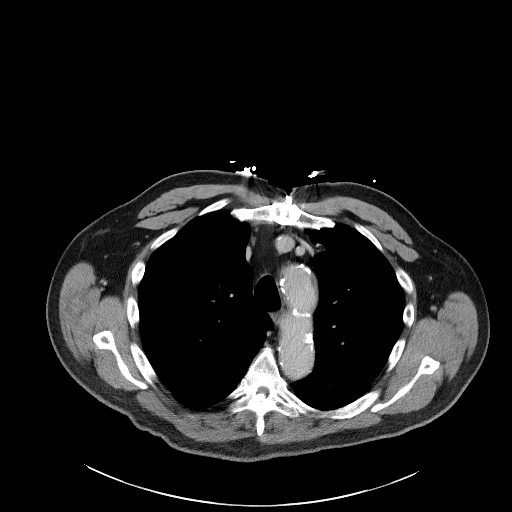
[im 114/134  lung]
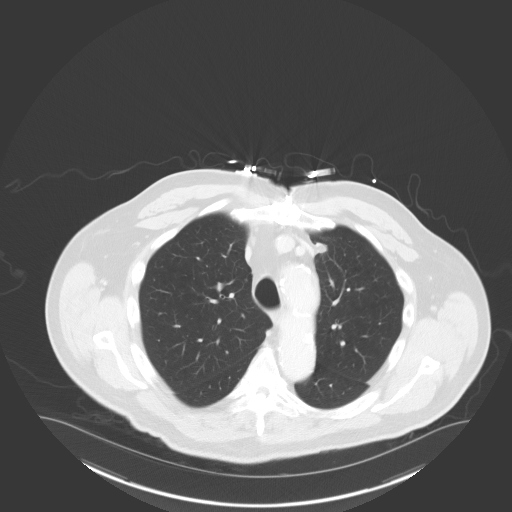
[im 127/134  lung]
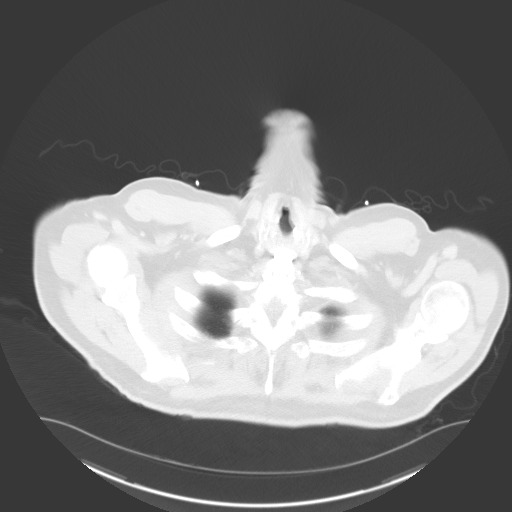

[Series 4: coronals · coronal · 0.82mm/px · 3 of 160 slices shown]
[im 32/160  lung]
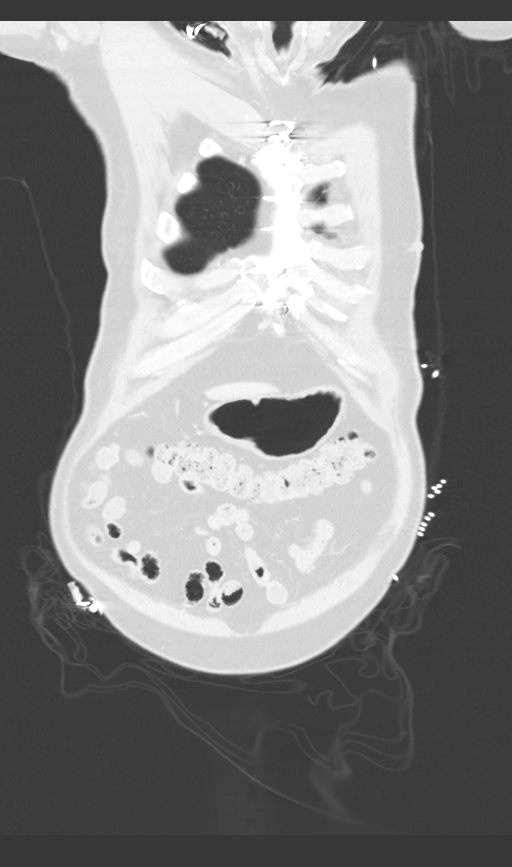
[im 64/160  lung]
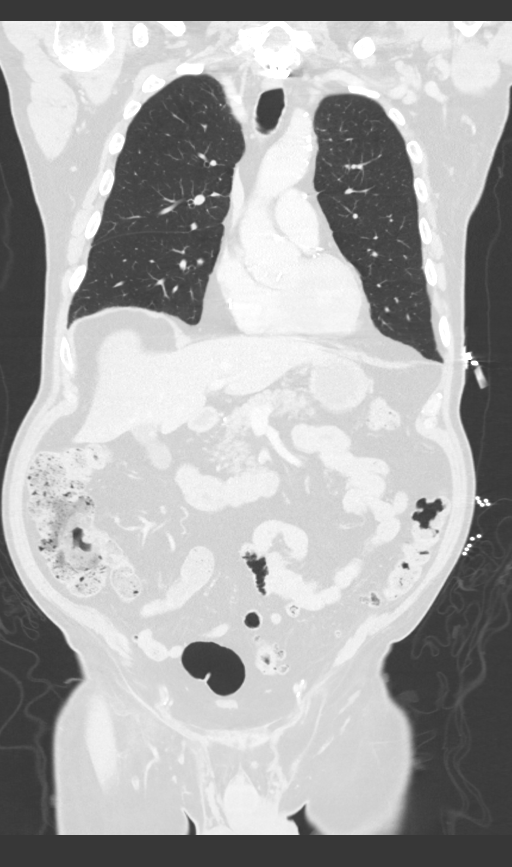
[im 96/160  lung]
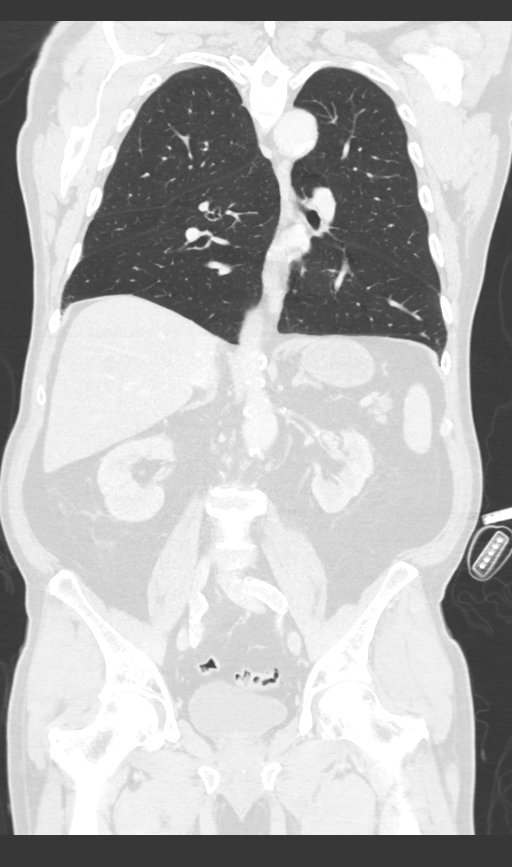

[13 of 36 positions shown; findings below may reference images not displayed]

FINDINGS: CT CHEST FINDINGS

Cardiovascular: Nonaneurysmal aorta. Moderate aortic
atherosclerosis. Post CABG changes. Coronary artery calcification.
Normal heart size. No pericardial effusion.

Mediastinum/Nodes: No evidence for mediastinal hematoma. Midline
trachea. No thyroid mass. No significantly enlarged lymph nodes. The
esophagus is within normal limits.

Lungs/Pleura: Lungs are clear. No pleural effusion or pneumothorax.

Musculoskeletal: Post sternotomy. No acute or suspicious lesion.
Partially visualized hardware in the cervical spine. Degenerative
changes.

CT ABDOMEN PELVIS FINDINGS

Hepatobiliary: Multiple cysts in the liver. No calcified gallstones
or biliary dilatation. Small vascular calcification at the porta
hepatis.

Pancreas: Unremarkable. No pancreatic ductal dilatation or
surrounding inflammatory changes.

Spleen: Normal in size without focal abnormality.

Adrenals/Urinary Tract: Stable nodularity of left adrenal gland.
Right adrenal gland is normal. Intrarenal vascular calcifications.
Punctate nonobstructing stone in the kidneys bilaterally. The
bladder is normal

Stomach/Bowel: Stomach is within normal limits. Appendix appears
normal. No evidence of bowel wall thickening, distention, or
inflammatory changes. Sigmoid colon diverticular disease without
acute inflammation.

Vascular/Lymphatic: Extensive aortic atherosclerosis. Bilobed
infrarenal abdominal aortic aneurysm, measuring 5 cm in maximum
diameter. Aneurysm terminates at the bifurcation. Chronic mural
thrombus present within the aneurysm sac. No significantly enlarged
lymph nodes.

Reproductive: Prostate is unremarkable.

Other: Fat within the left greater than right inguinal canal. No
free air or free fluid.

Musculoskeletal: Degenerative changes. No acute or suspicious lesion
IMPRESSION: 1. No CT evidence for acute intra-abdominal, intrathoracic or
intrapelvic abnormality.
2. Bilobed infrarenal abdominal aortic aneurysm measuring up to 5 cm
in diameter. Recommend followup by abdomen and pelvis CTA in 3-6
months, and vascular surgery referral/consultation if not already
obtained. This recommendation follows ACR consensus guidelines:
White Paper of the ACR Incidental Findings Committee II on Vascular
Findings. [HOSPITAL] 8079; [DATE].
3. Sigmoid colon diverticular disease without acute inflammation.

## 2018-11-06 ENCOUNTER — Other Ambulatory Visit: Payer: Self-pay | Admitting: Family Medicine

## 2018-11-11 ENCOUNTER — Other Ambulatory Visit: Payer: Self-pay | Admitting: Family Medicine

## 2018-11-25 ENCOUNTER — Telehealth: Payer: Self-pay | Admitting: Cardiovascular Disease

## 2018-11-25 NOTE — Telephone Encounter (Signed)

## 2018-11-26 ENCOUNTER — Ambulatory Visit (INDEPENDENT_AMBULATORY_CARE_PROVIDER_SITE_OTHER): Payer: Medicare Other

## 2018-11-26 ENCOUNTER — Other Ambulatory Visit: Payer: Self-pay | Admitting: Cardiovascular Disease

## 2018-11-26 ENCOUNTER — Other Ambulatory Visit: Payer: Self-pay

## 2018-11-26 DIAGNOSIS — I6523 Occlusion and stenosis of bilateral carotid arteries: Secondary | ICD-10-CM

## 2018-11-27 ENCOUNTER — Telehealth: Payer: Self-pay | Admitting: *Deleted

## 2018-11-27 NOTE — Telephone Encounter (Signed)
Notes recorded by Laurine Blazer, LPN on 1/94/1740 at 8:14 PM EDT  Left message to return call.   ------   Notes recorded by Herminio Commons, MD on 11/26/2018 at 11:29 AM EDT  Moderate right sided blockage. Repeat in 1 yr.

## 2018-12-02 NOTE — Telephone Encounter (Signed)
Notes recorded by Laurine Blazer, LPN on 1/84/0375 at 4:36 PM EDT  Patient notified. Copy to pmd.  ------

## 2018-12-03 ENCOUNTER — Encounter (HOSPITAL_COMMUNITY): Payer: Medicare Other

## 2018-12-08 DIAGNOSIS — M79674 Pain in right toe(s): Secondary | ICD-10-CM | POA: Diagnosis not present

## 2018-12-08 DIAGNOSIS — M79675 Pain in left toe(s): Secondary | ICD-10-CM | POA: Diagnosis not present

## 2018-12-08 DIAGNOSIS — I739 Peripheral vascular disease, unspecified: Secondary | ICD-10-CM | POA: Diagnosis not present

## 2018-12-08 DIAGNOSIS — B351 Tinea unguium: Secondary | ICD-10-CM | POA: Diagnosis not present

## 2018-12-09 ENCOUNTER — Other Ambulatory Visit: Payer: Self-pay | Admitting: Family Medicine

## 2018-12-09 NOTE — Telephone Encounter (Signed)
See prior note six mo worth

## 2018-12-09 NOTE — Telephone Encounter (Signed)
Six mo ok 

## 2018-12-09 NOTE — Telephone Encounter (Signed)
Call and sched appt, then may refill times six mo

## 2018-12-10 NOTE — Telephone Encounter (Signed)
Nurses This got routed to me Apparently Dr. Richardson Landry wants an office visit may have refills of each of these for 1 month

## 2018-12-11 ENCOUNTER — Telehealth: Payer: Self-pay | Admitting: Family Medicine

## 2018-12-11 DIAGNOSIS — I10 Essential (primary) hypertension: Secondary | ICD-10-CM

## 2018-12-11 DIAGNOSIS — Z794 Long term (current) use of insulin: Secondary | ICD-10-CM

## 2018-12-11 DIAGNOSIS — E785 Hyperlipidemia, unspecified: Secondary | ICD-10-CM

## 2018-12-11 DIAGNOSIS — E118 Type 2 diabetes mellitus with unspecified complications: Secondary | ICD-10-CM

## 2018-12-11 NOTE — Telephone Encounter (Signed)
Last labs 02/2018- Lipid, Liver, Met & and HgbA1c

## 2018-12-11 NOTE — Telephone Encounter (Signed)
As per dr Nicki Reaper ref times one unless alreDY DONE

## 2018-12-11 NOTE — Telephone Encounter (Signed)
Patient has schedules 6 month follow up on 8/13 and needing labs done and also refills on medication that the pharmacy will be faxing over.

## 2018-12-11 NOTE — Telephone Encounter (Signed)
Blood work ordered ordered in [employer]. Patient notified.

## 2018-12-11 NOTE — Telephone Encounter (Signed)
Same

## 2018-12-12 DIAGNOSIS — Z794 Long term (current) use of insulin: Secondary | ICD-10-CM | POA: Diagnosis not present

## 2018-12-12 DIAGNOSIS — E118 Type 2 diabetes mellitus with unspecified complications: Secondary | ICD-10-CM | POA: Diagnosis not present

## 2018-12-12 DIAGNOSIS — I10 Essential (primary) hypertension: Secondary | ICD-10-CM | POA: Diagnosis not present

## 2018-12-12 DIAGNOSIS — E785 Hyperlipidemia, unspecified: Secondary | ICD-10-CM | POA: Diagnosis not present

## 2018-12-13 LAB — BASIC METABOLIC PANEL
BUN/Creatinine Ratio: 22 (ref 10–24)
BUN: 24 mg/dL (ref 8–27)
CO2: 21 mmol/L (ref 20–29)
Calcium: 9.7 mg/dL (ref 8.6–10.2)
Chloride: 100 mmol/L (ref 96–106)
Creatinine, Ser: 1.09 mg/dL (ref 0.76–1.27)
GFR calc Af Amer: 74 mL/min/{1.73_m2} (ref >59)
GFR calc non Af Amer: 64 mL/min/{1.73_m2} (ref >59)
Glucose: 103 mg/dL — ABNORMAL HIGH (ref 65–99)
Potassium: 4.6 mmol/L (ref 3.5–5.2)
Sodium: 137 mmol/L (ref 134–144)

## 2018-12-13 LAB — HEPATIC FUNCTION PANEL
ALT: 13 IU/L (ref 0–44)
AST: 16 IU/L (ref 0–40)
Albumin: 4.3 g/dL (ref 3.7–4.7)
Alkaline Phosphatase: 78 IU/L (ref 39–117)
Bilirubin Total: 0.6 mg/dL (ref 0.0–1.2)
Bilirubin, Direct: 0.18 mg/dL (ref 0.00–0.40)
Total Protein: 6.4 g/dL (ref 6.0–8.5)

## 2018-12-13 LAB — LIPID PANEL
Chol/HDL Ratio: 2.5 ratio (ref 0.0–5.0)
Cholesterol, Total: 89 mg/dL — ABNORMAL LOW (ref 100–199)
HDL: 36 mg/dL — ABNORMAL LOW (ref >39)
LDL Calculated: 37 mg/dL (ref 0–99)
Triglycerides: 79 mg/dL (ref 0–149)
VLDL Cholesterol Cal: 16 mg/dL (ref 5–40)

## 2018-12-13 LAB — HEMOGLOBIN A1C
Est. average glucose Bld gHb Est-mCnc: 117 mg/dL
Hgb A1c MFr Bld: 5.7 % — ABNORMAL HIGH (ref 4.8–5.6)

## 2018-12-25 ENCOUNTER — Other Ambulatory Visit: Payer: Self-pay

## 2018-12-25 ENCOUNTER — Ambulatory Visit (INDEPENDENT_AMBULATORY_CARE_PROVIDER_SITE_OTHER): Payer: Medicare Other | Admitting: Family Medicine

## 2018-12-25 DIAGNOSIS — Z794 Long term (current) use of insulin: Secondary | ICD-10-CM | POA: Diagnosis not present

## 2018-12-25 DIAGNOSIS — I10 Essential (primary) hypertension: Secondary | ICD-10-CM

## 2018-12-25 DIAGNOSIS — E785 Hyperlipidemia, unspecified: Secondary | ICD-10-CM

## 2018-12-25 DIAGNOSIS — I6523 Occlusion and stenosis of bilateral carotid arteries: Secondary | ICD-10-CM

## 2018-12-25 DIAGNOSIS — E118 Type 2 diabetes mellitus with unspecified complications: Secondary | ICD-10-CM

## 2018-12-25 DIAGNOSIS — F5101 Primary insomnia: Secondary | ICD-10-CM

## 2018-12-25 MED ORDER — SIMVASTATIN 80 MG PO TABS: 80.0000 mg | ORAL_TABLET | Freq: Every day | ORAL | 5 refills | Status: DC

## 2018-12-25 MED ORDER — CLOPIDOGREL BISULFATE 75 MG PO TABS: 75.0000 mg | ORAL_TABLET | Freq: Every day | ORAL | 5 refills | Status: DC

## 2018-12-25 MED ORDER — LISINOPRIL 20 MG PO TABS: ORAL_TABLET | ORAL | 5 refills | Status: DC

## 2018-12-25 MED ORDER — NEOMYCIN-POLYMYXIN-HC 3.5-10000-1 OT SOLN: OTIC | 0 refills | Status: DC

## 2018-12-25 MED ORDER — NORTRIPTYLINE HCL 25 MG PO CAPS: 25.0000 mg | ORAL_CAPSULE | Freq: Every day | ORAL | 5 refills | Status: DC

## 2018-12-25 MED ORDER — TEMAZEPAM 30 MG PO CAPS: 30.0000 mg | ORAL_CAPSULE | Freq: Every day | ORAL | 5 refills | Status: DC

## 2018-12-25 NOTE — Progress Notes (Signed)
Subjective:  Audio only  Patient ID: [name], male    DOB: 1940-09-04, 78 y.o.   MRN: [id number]  Diabetes He presents for his follow-up diabetic visit. He has type 2 diabetes mellitus. Current diabetic treatment includes diet. Home blood sugar record trend: pt states he does not really check sugar.  A1C on bloodwork was 5.7 13 days ago.  Pt states he checks bp from time to time and it is always good. Has not checked today.   Virtual Visit via Telephone Note  I connected with [name] on 12/25/18 at  9:00 AM EDT by telephone and verified that I am speaking with the correct person using two identifiers.  Location: Patient: home Provider: office   I discussed the limitations, risks, security and privacy concerns of performing an evaluation and management service by telephone and the availability of in person appointments. I also discussed with the patient that there may be a patient responsible charge related to this service. The patient expressed understanding and agreed to proceed.   History of Present Illness:    Observations/Objective:   Assessment and Plan:   Follow Up Instructions:    I discussed the assessment and treatment plan with the patient. The patient was provided an opportunity to ask questions and all were answered. The patient agreed with the plan and demonstrated an understanding of the instructions.   The patient was advised to call back or seek an in-person evaluation if the symptoms worsen or if the condition fails to improve as anticipated.  I provided 28 minutes of non-face-to-face time during this encounter.  Results for orders placed or performed in visit on 12/11/18  Lipid panel  Result Value Ref Range   Cholesterol, Total 89 (L) 100 - 199 mg/dL   Triglycerides 79 0 - 149 mg/dL   HDL 36 (L) >39 mg/dL   VLDL Cholesterol Cal 16 5 - 40 mg/dL   LDL Calculated 37 0 - 99 mg/dL   Chol/HDL Ratio 2.5 0.0 - 5.0 ratio  Hepatic function  panel  Result Value Ref Range   Total Protein 6.4 6.0 - 8.5 g/dL   Albumin 4.3 3.7 - 4.7 g/dL   Bilirubin Total 0.6 0.0 - 1.2 mg/dL   Bilirubin, Direct 0.18 0.00 - 0.40 mg/dL   Alkaline Phosphatase 78 39 - 117 IU/L   AST 16 0 - 40 IU/L   ALT 13 0 - 44 IU/L  Hemoglobin A1c  Result Value Ref Range   Hgb A1c MFr Bld 5.7 (H) 4.8 - 5.6 %   Est. average glucose Bld gHb Est-mCnc 117 mg/dL  Basic metabolic panel  Result Value Ref Range   Glucose 103 (H) 65 - 99 mg/dL   BUN 24 8 - 27 mg/dL   Creatinine, Ser 1.09 0.76 - 1.27 mg/dL   GFR calc non Af Amer 64 >59 mL/min/1.73   GFR calc Af Amer 74 >59 mL/min/1.73   BUN/Creatinine Ratio 22 10 - 24   Sodium 137 134 - 144 mmol/L   Potassium 4.6 3.5 - 5.2 mmol/L   Chloride 100 96 - 106 mmol/L   CO2 21 20 - 29 mmol/L   Calcium 9.7 8.6 - 10.2 mg/dL   Blood pressure medicine and blood pressure levels reviewed today with patient. Compliant with blood pressure medicine. States does not miss a dose. No obvious side effects. Blood pressure generally good when checked elsewhere. Watching salt intake.   Patient claims compliance with diabetes medication. No obvious side effects.  Reports no substantial low sugar spells. Most numbers are generally in good range when checked fasting. Generally does not miss a dose of medication. Watching diabetic diet closely  Patient continues to take lipid medication regularly. No obvious side effects from it. Generally does not miss a dose. Prior blood work results are reviewed with patient. Patient continues to work on fat intake in diet  Patient compliant with insomnia medication. Generally takes most nights. No obvious morning drowsiness. Definitely helps patient sleep. Without it patient states would not get a good nights rest.   bp 12 over 70   Review of Systems No headache, no major weight loss or weight gain, no chest pain no back pain abdominal pain no change in bowel habits complete ROS otherwise negative      Objective:   Physical Exam   4 T virtual     Assessment & Plan:  Impression 1 type 2 diabetes good control though not perfect discussed maintain same meds Impression  2.  Hypertension good control discussed to maintain same meds  3.  Hyperlipidemia blood work reviewed very discussed compliance discussed  4.  Insomnia.  Ongoing substantial maintain same meds  Follow-up in 6 months for wellness and chronic flu shot this fall  Greater than 50% of this 25 minute face to face visit was spent in counseling and discussion and coordination of care regarding the above diagnosis/diagnosies

## 2018-12-27 ENCOUNTER — Encounter: Payer: Self-pay | Admitting: Family Medicine

## 2019-01-09 ENCOUNTER — Other Ambulatory Visit: Payer: Self-pay | Admitting: Family Medicine

## 2019-01-29 ENCOUNTER — Other Ambulatory Visit: Payer: Self-pay

## 2019-01-29 ENCOUNTER — Other Ambulatory Visit (INDEPENDENT_AMBULATORY_CARE_PROVIDER_SITE_OTHER): Payer: Medicare Other | Admitting: *Deleted

## 2019-01-29 DIAGNOSIS — Z23 Encounter for immunization: Secondary | ICD-10-CM | POA: Diagnosis not present

## 2019-02-16 DIAGNOSIS — B351 Tinea unguium: Secondary | ICD-10-CM | POA: Diagnosis not present

## 2019-02-16 DIAGNOSIS — M79675 Pain in left toe(s): Secondary | ICD-10-CM | POA: Diagnosis not present

## 2019-02-16 DIAGNOSIS — M79674 Pain in right toe(s): Secondary | ICD-10-CM | POA: Diagnosis not present

## 2019-02-16 DIAGNOSIS — I739 Peripheral vascular disease, unspecified: Secondary | ICD-10-CM | POA: Diagnosis not present

## 2019-03-09 ENCOUNTER — Other Ambulatory Visit: Payer: Self-pay

## 2019-03-09 DIAGNOSIS — I714 Abdominal aortic aneurysm, without rupture, unspecified: Secondary | ICD-10-CM

## 2019-03-09 DIAGNOSIS — I739 Peripheral vascular disease, unspecified: Secondary | ICD-10-CM

## 2019-03-10 ENCOUNTER — Telehealth (HOSPITAL_COMMUNITY): Payer: Self-pay | Admitting: *Deleted

## 2019-03-10 NOTE — Telephone Encounter (Signed)
The above patient or their representative was contacted and gave the following answers to these questions:         Do you have any of the following symptoms?    NO  Fever                    Cough                   Shortness of breath  Do  you have any of the following other symptoms? n   muscle pain         vomiting,        diarrhea        rash         weakness        red eye        abdominal pain         bruising          bruising or bleeding              joint pain           severe headache    Have you been in contact with someone who was or has been sick in the past 2 weeks?  NO  Yes                 Unsure                         Unable to assess   Does the person that you were in contact with have any of the following symptoms?   Cough         shortness of breath           muscle pain         vomiting,            diarrhea            rash            weakness           fever            red eye           abdominal pain           bruising  or  bleeding                joint pain                severe headache                 COMMENTS OR ACTION PLAN FOR THIS PATIENT:         [facility]  

## 2019-03-11 ENCOUNTER — Ambulatory Visit (INDEPENDENT_AMBULATORY_CARE_PROVIDER_SITE_OTHER)
Admission: RE | Admit: 2019-03-11 | Discharge: 2019-03-11 | Disposition: A | Discharge status: Home / Self Care | Payer: Medicare Other | Source: Ambulatory Visit | Attending: Vascular Surgery | Admitting: Vascular Surgery

## 2019-03-11 ENCOUNTER — Other Ambulatory Visit: Payer: Self-pay

## 2019-03-11 ENCOUNTER — Ambulatory Visit (INDEPENDENT_AMBULATORY_CARE_PROVIDER_SITE_OTHER): Payer: Medicare Other | Admitting: Vascular Surgery

## 2019-03-11 ENCOUNTER — Encounter: Payer: Self-pay | Admitting: Vascular Surgery

## 2019-03-11 ENCOUNTER — Ambulatory Visit (HOSPITAL_COMMUNITY)
Admission: RE | Admit: 2019-03-11 | Discharge: 2019-03-11 | Disposition: A | Discharge status: Home / Self Care | Payer: Medicare Other | Source: Ambulatory Visit | Attending: Vascular Surgery | Admitting: Vascular Surgery

## 2019-03-11 VITALS — BP 139/80 | HR 68 | Temp 97.7°F | Resp 20 | Ht 70.0 in | Wt 182.0 lb

## 2019-03-11 DIAGNOSIS — I714 Abdominal aortic aneurysm, without rupture, unspecified: Secondary | ICD-10-CM

## 2019-03-11 DIAGNOSIS — I739 Peripheral vascular disease, unspecified: Secondary | ICD-10-CM

## 2019-03-11 DIAGNOSIS — I6523 Occlusion and stenosis of bilateral carotid arteries: Secondary | ICD-10-CM | POA: Diagnosis not present

## 2019-03-11 NOTE — Progress Notes (Addendum)
Patient name: [name] MRN: [id number] DOB: [birth year] Sex: male  REASON FOR VISIT:   Follow-up of abdominal aortic aneurysm and also peripheral vascular disease.  HPI:   [name] is a pleasant 78 y.o. male who I last saw on 03/12/2018.  At that time he had a 5 cm infrarenal abdominal aortic aneurysm.  He was not a smoker and his blood pressure was under good control.  Comes in for 28-monthfollow-up visit.  Of note he also has a history of infrainguinal arterial occlusive disease on the left with an ABI of 43% at that time.  He had stable claudication.  I ordered follow-up ABIs in 1 year.  Since I saw him last, he continues to have stable claudication in the left calf.  He has no significant symptoms on the right side.  He denies rest pain or nonhealing ulcers.  He denies any abdominal pain or back pain.  He quit smoking 16 years ago.  His blood pressures been under good control.  Past Medical History:  Diagnosis Date  . Abdominal aortic aneurysm (HCC)    3.5 cm in 2011  . Adenomatous polyps   . Arteriosclerotic cardiovascular disease (ASCVD) 2006   CABG in 2006; normal EF  . CAD (coronary artery disease)   . Carotid stenosis   . 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 Degenerative joint disease of spine 2011   Lumbosacral and cervical; ant. discectomy, fusion and plate in [id number] . DIABETES MELLITUS, TYPE II 01/31/2010  . Diverticulosis   . ED (erectile dysfunction)   . Hyperlipidemia   . Hypertension    LVH  . Insomnia   . Neuropathy   . Peripheral vascular disease ([place]    Left femoral art. stent; bilateral renal art. stents-2007; ABIs-nl right; 0.80 left in 5/06; angio in 2007-95% bilat. renal; diffuse aortoiliac ASCVD +ulceration;     . Sinus headache   . [place]roke ([place]   . Tobacco abuse, in remission    [id number]   Family History  Problem Relation Age of Onset  . Stroke  Father   . Pulmonary embolism Mother        Following hip fracture  . Colon cancer Brother   . Hypertension Brother     SOCIAL HISTORY: Social History   Tobacco Use  . Smoking status: Former Smoker    Packs/day: 1.00    Years: 50.00    Pack years: 50.00    Types: Cigarettes    Start date: 05/10/1952    Quit date: 10/18/2003    Years since quitting: 15.4  . Smokeless tobacco: Never Used  . Tobacco comment: 50 pack years; quit in 2005   Substance Use Topics  . Alcohol use: No    Alcohol/week: 0.0 standard drinks    Allergies  Allergen Reactions  . Atorvastatin Other (See Comments)    Side effects-muscle aches Other reaction(s): Other (See Comments) Side effects-muscle aches Side effects-muscle aches  . Clopidogrel Other (See Comments)    Pt does not know Pt denies being allergic . Takes Plavix now     Current Outpatient Medications  Medication Sig Dispense Refill  . ALPRAZolam (XANAX) 1 MG tablet TAKE 1 TABLET BY MOUTH AT BEDTIME. 30 tablet 5  . blood glucose meter kit and supplies KIT Dispense based on patient and insurance preference. Use up to four times daily to check your blood  sugar (ICD-10 code E11.9) 1 each 0  . clopidogrel (PLAVIX) 75 MG tablet Take 1 tablet (75 mg total) by mouth daily. 30 tablet 5  . lisinopril (ZESTRIL) 20 MG tablet TAKE (1) TABLET TWICE DAILY. 60 tablet 5  . Multiple Vitamin (MULTIVITAMIN) tablet Take 1 tablet by mouth daily.      Marland Kitchen neomycin-polymyxin-hydrocortisone (CORTISPORIN) OTIC solution Use 3 -4 drops qid in affected ear 10 mL 0  . nortriptyline (PAMELOR) 25 MG capsule Take 1 capsule (25 mg total) by mouth at bedtime. 30 capsule 5  . Omega-3 Fatty Acids (FISH OIL) 1000 MG CAPS Take 1 capsule by mouth daily.      . simvastatin (ZOCOR) 80 MG tablet Take 1 tablet (80 mg total) by mouth daily. 30 tablet 5  . temazepam (RESTORIL) 30 MG capsule Take 1 capsule (30 mg total) by mouth at bedtime. 30 capsule 5   No current  facility-administered medications for this visit.     REVIEW OF SYSTEMS:  [X]  denotes positive finding, [ ]  denotes negative finding Cardiac  Comments:  Chest pain or chest pressure:    Shortness of breath upon exertion:    Short of breath when lying flat:    Irregular heart rhythm:        Vascular    Pain in calf, thigh, or hip brought on by ambulation: x  left calf  Pain in feet at night that wakes you up from your sleep:     Blood clot in your veins:    Leg swelling:         Pulmonary    Oxygen at home:    Productive cough:     Wheezing:         Neurologic    Sudden weakness in arms or legs:     Sudden numbness in arms or legs:     Sudden onset of difficulty speaking or slurred speech:    Temporary loss of vision in one eye:     Problems with dizziness:         Gastrointestinal    Blood in stool:     Vomited blood:         Genitourinary    Burning when urinating:     Blood in urine:        Psychiatric    Major depression:         Hematologic    Bleeding problems:    Problems with blood clotting too easily:        Skin    Rashes or ulcers:        Constitutional    Fever or chills:     PHYSICAL EXAM:   Vitals:   03/11/19 0913  BP: 139/80  Pulse: 68  Resp: 20  Temp: 97.7 F (36.5 C)  SpO2: 100%  Weight: 182 lb (82.6 kg)  Height: 5' 10"  (1.778 m)    GENERAL: The patient is a well-nourished male, in no acute distress. The vital signs are documented above. CARDIAC: There is a regular rate and rhythm.  VASCULAR: I do not detect carotid bruits. On the right side he has a palpable femoral and popliteal pulse.  I cannot palpate pedal pulses. On the left side he has a palpable femoral pulse.  I cannot palpate popliteal or pedal pulses. He has no significant lower extremity swelling. PULMONARY: There is good air exchange bilaterally without wheezing or rales. ABDOMEN: Soft and non-tender with normal pitched bowel sounds.  MUSCULOSKELETAL: There are no  major deformities or cyanosis. NEUROLOGIC: No focal weakness or paresthesias are detected. SKIN: There are no ulcers or rashes noted. PSYCHIATRIC: The patient has a normal affect.  DATA:    DUPLEX ABDOMINAL AORTA: I have independently interpreted his duplex of his abdominal aortic aneurysm.  The maximum diameter is 5.1 cm which is not changed significantly in 6 months.  It was previously 5.0 cm.  The right common iliac artery measures 1.7 cm in maximum diameter.  The left common iliac artery measures 1.6 cm in maximum diameter.  ARTERIAL DOPPLER STUDY: I have independently interpreted his arterial Doppler study today.  On the left side, which is the symptomatic side, he has a monophasic dorsalis pedis and posterior tibial signal.  ABI is 53%.  Toe pressures 43 mmHg.  On the right side, he has a monophasic posterior tibial signal with a biphasic dorsalis pedis signal.  ABI is 94%.  Toe pressures 108 mmHg.   MEDICAL ISSUES:   ABDOMINAL AORTIC ANEURYSM: This patient's infrarenal abdominal aortic aneurysm is stable in size at 5.1 cm.  He understands we would not consider elective repair unless it reached 5.5 cm in a normal risk patient.  I ordered a follow-up ultrasound in 6 months and I will see him back at that time.  If the aneurysm does enlarge to 5.5 cm then we would need to obtain a CT angiogram to evaluate our options for repair.  I explained to the patient that in approximately 70% of patients we can address the aneurysm with an endovascular approach.  Given his age certainly we would favor an endovascular approach if the aneurysm enlarges and try to avoid open repair.  Fortunately he is not a smoker.  His blood pressures been under good control.  PERIPHERAL VASCULAR DISEASE: He has stable claudication of the left lower extremity.  He is not a smoker.  I encouraged him to stay as active as possible.  He scheduled for follow-up ABIs in 6 months and I will see him back at that time.  He knows  to call sooner if he has problems.  Deitra Mayo [facility] [phone]

## 2019-04-27 DIAGNOSIS — B351 Tinea unguium: Secondary | ICD-10-CM | POA: Diagnosis not present

## 2019-04-27 DIAGNOSIS — I739 Peripheral vascular disease, unspecified: Secondary | ICD-10-CM | POA: Diagnosis not present

## 2019-04-27 DIAGNOSIS — M79674 Pain in right toe(s): Secondary | ICD-10-CM | POA: Diagnosis not present

## 2019-04-27 DIAGNOSIS — M79675 Pain in left toe(s): Secondary | ICD-10-CM | POA: Diagnosis not present

## 2019-05-04 ENCOUNTER — Encounter: Payer: Self-pay | Admitting: Family Medicine

## 2019-05-04 DIAGNOSIS — E118 Type 2 diabetes mellitus with unspecified complications: Secondary | ICD-10-CM

## 2019-05-04 DIAGNOSIS — Z79899 Other long term (current) drug therapy: Secondary | ICD-10-CM

## 2019-05-04 DIAGNOSIS — I10 Essential (primary) hypertension: Secondary | ICD-10-CM

## 2019-05-04 DIAGNOSIS — E785 Hyperlipidemia, unspecified: Secondary | ICD-10-CM

## 2019-05-04 DIAGNOSIS — Z794 Long term (current) use of insulin: Secondary | ICD-10-CM

## 2019-05-26 DIAGNOSIS — Z23 Encounter for immunization: Secondary | ICD-10-CM | POA: Diagnosis not present

## 2019-06-03 ENCOUNTER — Telehealth: Payer: Self-pay | Admitting: Family Medicine

## 2019-06-03 NOTE — Telephone Encounter (Signed)
Pt has scheduled phone visit for 2/3 and would like to know if he needs lab work.

## 2019-06-03 NOTE — Telephone Encounter (Signed)
Patient has active lab orders in Epic to be done before next appt. Patient notified and verbalized understanding.

## 2019-06-05 ENCOUNTER — Other Ambulatory Visit: Payer: Self-pay | Admitting: Family Medicine

## 2019-06-07 NOTE — Telephone Encounter (Signed)
6 mo all

## 2019-06-08 DIAGNOSIS — Z79899 Other long term (current) drug therapy: Secondary | ICD-10-CM | POA: Diagnosis not present

## 2019-06-08 DIAGNOSIS — I10 Essential (primary) hypertension: Secondary | ICD-10-CM | POA: Diagnosis not present

## 2019-06-08 DIAGNOSIS — E118 Type 2 diabetes mellitus with unspecified complications: Secondary | ICD-10-CM | POA: Diagnosis not present

## 2019-06-08 DIAGNOSIS — E785 Hyperlipidemia, unspecified: Secondary | ICD-10-CM | POA: Diagnosis not present

## 2019-06-08 DIAGNOSIS — Z794 Long term (current) use of insulin: Secondary | ICD-10-CM | POA: Diagnosis not present

## 2019-06-09 LAB — LIPID PANEL
Chol/HDL Ratio: 2.6 ratio (ref 0.0–5.0)
Cholesterol, Total: 98 mg/dL — ABNORMAL LOW (ref 100–199)
HDL: 38 mg/dL — ABNORMAL LOW (ref >39)
LDL Chol Calc (NIH): 42 mg/dL (ref 0–99)
Triglycerides: 89 mg/dL (ref 0–149)
VLDL Cholesterol Cal: 18 mg/dL (ref 5–40)

## 2019-06-09 LAB — HEMOGLOBIN A1C
Est. average glucose Bld gHb Est-mCnc: 114 mg/dL
Hgb A1c MFr Bld: 5.6 % (ref 4.8–5.6)

## 2019-06-09 LAB — BASIC METABOLIC PANEL
BUN/Creatinine Ratio: 15 (ref 10–24)
BUN: 18 mg/dL (ref 8–27)
CO2: 22 mmol/L (ref 20–29)
Calcium: 9.8 mg/dL (ref 8.6–10.2)
Chloride: 102 mmol/L (ref 96–106)
Creatinine, Ser: 1.23 mg/dL (ref 0.76–1.27)
GFR calc Af Amer: 64 mL/min/{1.73_m2} (ref >59)
GFR calc non Af Amer: 55 mL/min/{1.73_m2} — ABNORMAL LOW (ref >59)
Glucose: 110 mg/dL — ABNORMAL HIGH (ref 65–99)
Potassium: 5.3 mmol/L — ABNORMAL HIGH (ref 3.5–5.2)
Sodium: 138 mmol/L (ref 134–144)

## 2019-06-09 LAB — HEPATIC FUNCTION PANEL
ALT: 15 IU/L (ref 0–44)
AST: 18 IU/L (ref 0–40)
Albumin: 4.3 g/dL (ref 3.7–4.7)
Alkaline Phosphatase: 81 IU/L (ref 39–117)
Bilirubin Total: 0.7 mg/dL (ref 0.0–1.2)
Bilirubin, Direct: 0.23 mg/dL (ref 0.00–0.40)
Total Protein: 6.3 g/dL (ref 6.0–8.5)

## 2019-06-17 ENCOUNTER — Ambulatory Visit (INDEPENDENT_AMBULATORY_CARE_PROVIDER_SITE_OTHER): Payer: Medicare Other | Admitting: Family Medicine

## 2019-06-17 ENCOUNTER — Other Ambulatory Visit: Payer: Self-pay

## 2019-06-17 DIAGNOSIS — E785 Hyperlipidemia, unspecified: Secondary | ICD-10-CM

## 2019-06-17 DIAGNOSIS — E118 Type 2 diabetes mellitus with unspecified complications: Secondary | ICD-10-CM | POA: Diagnosis not present

## 2019-06-17 DIAGNOSIS — Z794 Long term (current) use of insulin: Secondary | ICD-10-CM | POA: Diagnosis not present

## 2019-06-17 DIAGNOSIS — I10 Essential (primary) hypertension: Secondary | ICD-10-CM

## 2019-06-17 NOTE — Progress Notes (Signed)
Subjective:    Patient ID: [name], male    DOB: 1941/02/15, 79 y.o.   MRN: [id number]  Diabetes He presents for his follow-up diabetic visit. He has type 2 diabetes mellitus. Risk factors for coronary artery disease include diabetes mellitus, dyslipidemia and hypertension. Current diabetic treatment includes diet.   Discuss recent labs  Virtual Visit via Video Note  I connected with [name] on 06/17/19 at  2:00 PM EST by a video enabled telemedicine application and verified that I am speaking with the correct person using two identifiers.  Location: Patient: home Provider: office   I discussed the limitations of evaluation and management by telemedicine and the availability of in person appointments. The patient expressed understanding and agreed to proceed.  History of Present Illness:    Observations/Objective:   Assessment and Plan:   Follow Up Instructions:    I discussed the assessment and treatment plan with the patient. The patient was provided an opportunity to ask questions and all were answered. The patient agreed with the plan and demonstrated an understanding of the instructions.   The patient was advised to call back or seek an in-person evaluation if the symptoms worsen or if the condition fails to improve as anticipated.  I provided 30 minutes of non-face-to-face time during this encounter.  Results for orders placed or performed in visit on [id number]  Basic Metabolic Panel (BMET)  Result Value Ref Range   Glucose 110 (H) 65 - 99 mg/dL   BUN 18 8 - 27 mg/dL   Creatinine, Ser 1.23 0.76 - 1.27 mg/dL   GFR calc non Af Amer 55 (L) >59 mL/min/1.73   GFR calc Af Amer 64 >59 mL/min/1.73   BUN/Creatinine Ratio 15 10 - 24   Sodium 138 134 - 144 mmol/L   Potassium 5.3 (H) 3.5 - 5.2 mmol/L   Chloride 102 96 - 106 mmol/L   CO2 22 20 - 29 mmol/L   Calcium 9.8 8.6 - 10.2 mg/dL  Hemoglobin A1c  Result Value Ref Range   Hgb A1c MFr Bld 5.6 4.8 -  5.6 %   Est. average glucose Bld gHb Est-mCnc 114 mg/dL  Hepatic function panel  Result Value Ref Range   Total Protein 6.3 6.0 - 8.5 g/dL   Albumin 4.3 3.7 - 4.7 g/dL   Bilirubin Total 0.7 0.0 - 1.2 mg/dL   Bilirubin, Direct 0.23 0.00 - 0.40 mg/dL   Alkaline Phosphatase 81 39 - 117 IU/L   AST 18 0 - 40 IU/L   ALT 15 0 - 44 IU/L  Lipid Profile  Result Value Ref Range   Cholesterol, Total 98 (L) 100 - 199 mg/dL   Triglycerides 89 0 - 149 mg/dL   HDL 38 (L) >39 mg/dL   VLDL Cholesterol Cal 18 5 - 40 mg/dL   LDL Chol Calc (NIH) 42 0 - 99 mg/dL   Chol/HDL Ratio 2.6 0.0 - 5.0 ratio    Blood pressure medicine and blood pressure levels reviewed today with patient. Compliant with blood pressure medicine. States does not miss a dose. No obvious side effects. Blood pressure generally good when checked elsewhere. Watching salt intake.   Patient claims compliance with diabetes medication. No obvious side effects. Reports no substantial low sugar spells. Most numbers are generally in good range when checked fasting. Generally does not miss a dose of medication. Watching diabetic diet closely  Blood pressure medicine and blood pressure levels reviewed today with patient. Compliant with blood pressure  medicine. States does not miss a dose. No obvious side effects. Blood pressure generally good when checked elsewhere. Watching salt intake.      Review of Systems No headache no chest pain no shortness of breath    Objective:   Physical Exam  Virtual      Assessment & Plan:  Impression 1 type 2 diabetes excellent control discussed maintain same approach  2.  Hypertension blood pressure good when checked elsewhere to maintain same meds  3.  Hyperlipidemia excellent control discussed  Same medications/diet exercise discussed.  Blood work reviewed/follow-up in 6 months

## 2019-06-18 ENCOUNTER — Encounter: Payer: Self-pay | Admitting: Family Medicine

## 2019-06-18 MED ORDER — LISINOPRIL 20 MG PO TABS: ORAL_TABLET | ORAL | 5 refills | Status: DC

## 2019-06-19 ENCOUNTER — Encounter: Payer: Self-pay | Admitting: Family Medicine

## 2019-07-03 ENCOUNTER — Other Ambulatory Visit: Payer: Self-pay | Admitting: Family Medicine

## 2019-07-06 DIAGNOSIS — Z23 Encounter for immunization: Secondary | ICD-10-CM | POA: Diagnosis not present

## 2019-07-06 NOTE — Telephone Encounter (Signed)
Six mo all 

## 2019-07-27 DIAGNOSIS — M79674 Pain in right toe(s): Secondary | ICD-10-CM | POA: Diagnosis not present

## 2019-07-27 DIAGNOSIS — I739 Peripheral vascular disease, unspecified: Secondary | ICD-10-CM | POA: Diagnosis not present

## 2019-07-27 DIAGNOSIS — B351 Tinea unguium: Secondary | ICD-10-CM | POA: Diagnosis not present

## 2019-07-27 DIAGNOSIS — M79675 Pain in left toe(s): Secondary | ICD-10-CM | POA: Diagnosis not present

## 2019-08-06 ENCOUNTER — Other Ambulatory Visit: Payer: Self-pay | Admitting: Family Medicine

## 2019-09-24 ENCOUNTER — Telehealth: Payer: Self-pay | Admitting: *Deleted

## 2019-09-24 ENCOUNTER — Telehealth (INDEPENDENT_AMBULATORY_CARE_PROVIDER_SITE_OTHER): Payer: Medicare Other | Admitting: Family Medicine

## 2019-09-24 ENCOUNTER — Encounter: Payer: Self-pay | Admitting: Family Medicine

## 2019-09-24 ENCOUNTER — Other Ambulatory Visit: Payer: Self-pay

## 2019-09-24 VITALS — Ht 70.0 in | Wt 185.0 lb

## 2019-09-24 DIAGNOSIS — J0110 Acute frontal sinusitis, unspecified: Secondary | ICD-10-CM | POA: Diagnosis not present

## 2019-09-24 MED ORDER — AMOXICILLIN 500 MG PO CAPS: 500.0000 mg | ORAL_CAPSULE | Freq: Three times a day (TID) | ORAL | 0 refills | Status: DC

## 2019-09-24 NOTE — Progress Notes (Signed)
Patient ID: [name], male    DOB: Feb 19, 1941, 79 y.o.   MRN: [id number]    Virtual Visit via Telephone Note  I connected with [name] on 09/24/19 at  3:30 PM EDT by telephone and verified that I am speaking with the correct person using two identifiers.  Location: Patient: home Provider: office   I discussed the limitations, risks, security and privacy concerns of performing an evaluation and management service by telephone and the availability of in person appointments. I also discussed with the patient that there may be a patient responsible charge related to this service. The patient expressed understanding and agreed to proceed.    Chief Complaint  Patient presents with  . Sinusitis   Subjective:    HPI  CC- cough, sinus pressure, sore throat.  Started yesterday. Tried otc- coricidin hbp. Pt had low grade temp yesterday, but resolved on own. Having some post nasal drip and sore throat.  Mild coughing occ with clear to white sputum. No sick contacts at home. No known covid contacts. Pt used to be a smoker.   Medical History [place] has a past medical history of Abdominal aortic aneurysm ([place]), Adenomatous polyps, Arteriosclerotic cardiovascular disease (ASCVD) (2006), CAD (coronary artery disease), Carotid stenosis, Cerebrovascular disease (2007), Degenerative joint disease of spine (2011), DIABETES MELLITUS, TYPE II (01/31/2010), Diverticulosis, ED (erectile dysfunction), Hyperlipidemia, Hypertension, Insomnia, Neuropathy, Peripheral vascular disease ([place]), Sinus headache, Stroke ([place]), and Tobacco abuse, in remission.   Outpatient Encounter Medications as of 09/24/2019  Medication Sig  . ALPRAZolam (XANAX) 1 MG tablet TAKE 1 TABLET BY MOUTH AT BEDTIME.  . blood glucose meter kit and supplies KIT Dispense based on patient and insurance preference. Use up to four times daily to check your blood sugar (ICD-10 code E11.9)  . clopidogrel (PLAVIX) 75 MG tablet TAKE  1 TABLET ONCE DAILY.  Marland Kitchen lisinopril (ZESTRIL) 20 MG tablet TAKE (1) TABLET TWICE DAILY.  . Multiple Vitamin (MULTIVITAMIN) tablet Take 1 tablet by mouth daily.    . nortriptyline (PAMELOR) 25 MG capsule TAKE 1 CAPSULE BY MOUTH AT BEDTIME.  Marland Kitchen Omega-3 Fatty Acids (FISH OIL) 1000 MG CAPS Take 1 capsule by mouth daily.    . simvastatin (ZOCOR) 80 MG tablet TAKE 1 TABLET BY MOUTH ONCE DAILY.  Marland Kitchen temazepam (RESTORIL) 30 MG capsule TAKE 1 CAPSULE BY MOUTH AT BEDTIME.  . [DISCONTINUED] neomycin-polymyxin-hydrocortisone (CORTISPORIN) OTIC solution Use 3 -4 drops qid in affected ear  . amoxicillin (AMOXIL) 500 MG capsule Take 1 capsule (500 mg total) by mouth 3 (three) times daily.   No facility-administered encounter medications on file as of 09/24/2019.     Review of Systems  Constitutional: Positive for fever. Negative for chills.  HENT: Positive for congestion, postnasal drip and sore throat. Negative for ear discharge, ear pain, rhinorrhea, sinus pressure, sinus pain and sneezing.   Eyes: Negative for pain, discharge, redness and itching.  Respiratory: Positive for cough (non prod).   Gastrointestinal: Negative for diarrhea, nausea and vomiting.  Skin: Negative for rash.  Neurological: Negative for headaches.     Vitals Ht 5' 10" (1.778 m)   Wt 185 lb (83.9 kg)   BMI 26.54 kg/m   Objective:   Physical Exam  No PE due to phone visit.  Assessment and Plan   1. Acute non-recurrent frontal sinusitis - amoxicillin (AMOXIL) 500 MG capsule; Take 1 capsule (500 mg total) by mouth 3 (three) times daily.  Dispense: 30 capsule; Refill: 0    Pt  gave watch and wait script and reviewed usual course of viral URI vs. Sinusitis. If not improving in the next 5-7 days can start the antibiotics, or if worsening sinus pain and pressure/purulent drainage or fever to start the antibiotics.  Use saline nasal spray/rinese.  Tylenol prn fever, pain. coricidin prn and inc fluids.   F/u if not improving  in next 7-10 days.  Pt in agreement.     Follow Up Instructions:    I discussed the assessment and treatment plan with the patient. The patient was provided an opportunity to ask questions and all were answered. The patient agreed with the plan and demonstrated an understanding of the instructions.   The patient was advised to call back or seek an in-person evaluation if the symptoms worsen or if the condition fails to improve as anticipated.  I provided 12 minutes of non-face-to-face time during this encounter.

## 2019-09-24 NOTE — Telephone Encounter (Signed)
[name], hammons are scheduled for a virtual visit with your provider today.    Just as we do with appointments in the office, we must obtain your consent to participate.  Your consent will be active for this visit and any virtual visit you may have with one of our providers in the next 365 days.    If you have a MyChart account, I can also send a copy of this consent to you electronically.  All virtual visits are billed to your insurance company just like a traditional visit in the office.  As this is a virtual visit, video technology does not allow for your provider to perform a traditional examination.  This may limit your provider's ability to fully assess your condition.  If your provider identifies any concerns that need to be evaluated in person or the need to arrange testing such as labs, EKG, etc, we will make arrangements to do so.    Although advances in technology are sophisticated, we cannot ensure that it will always work on either your end or our end.  If the connection with a video visit is poor, we may have to switch to a telephone visit.  With either a video or telephone visit, we are not always able to ensure that we have a secure connection.   I need to obtain your verbal consent now.   Are you willing to proceed with your visit today?   [name] has provided verbal consent on 09/24/2019 for a virtual visit (video or telephone).   Dayton Bailiff, LPN [id number]  [id number] PM

## 2019-09-29 ENCOUNTER — Other Ambulatory Visit: Payer: Self-pay

## 2019-09-29 ENCOUNTER — Ambulatory Visit: Payer: Medicare Other | Attending: Internal Medicine

## 2019-09-29 DIAGNOSIS — Z20822 Contact with and (suspected) exposure to covid-19: Secondary | ICD-10-CM | POA: Diagnosis not present

## 2019-09-30 LAB — SARS-COV-2, NAA 2 DAY TAT

## 2019-09-30 LAB — NOVEL CORONAVIRUS, NAA: SARS-CoV-2, NAA: NOT DETECTED

## 2019-10-05 DIAGNOSIS — I739 Peripheral vascular disease, unspecified: Secondary | ICD-10-CM | POA: Diagnosis not present

## 2019-10-05 DIAGNOSIS — M79674 Pain in right toe(s): Secondary | ICD-10-CM | POA: Diagnosis not present

## 2019-10-05 DIAGNOSIS — M79675 Pain in left toe(s): Secondary | ICD-10-CM | POA: Diagnosis not present

## 2019-10-05 DIAGNOSIS — B351 Tinea unguium: Secondary | ICD-10-CM | POA: Diagnosis not present

## 2019-10-07 ENCOUNTER — Telehealth: Payer: Self-pay | Admitting: *Deleted

## 2019-10-07 DIAGNOSIS — E785 Hyperlipidemia, unspecified: Secondary | ICD-10-CM

## 2019-10-07 DIAGNOSIS — E118 Type 2 diabetes mellitus with unspecified complications: Secondary | ICD-10-CM

## 2019-10-07 DIAGNOSIS — Z794 Long term (current) use of insulin: Secondary | ICD-10-CM

## 2019-10-07 DIAGNOSIS — Z79899 Other long term (current) drug therapy: Secondary | ICD-10-CM

## 2019-10-07 DIAGNOSIS — I10 Essential (primary) hypertension: Secondary | ICD-10-CM

## 2019-10-07 NOTE — Telephone Encounter (Signed)
Pls order those labs .  Fasting labs- and have pt complete about 3-7 days prior to appt.   Thx.   Dr. Lovena Le

## 2019-10-07 NOTE — Telephone Encounter (Signed)
Patient notified

## 2019-10-07 NOTE — Telephone Encounter (Addendum)
Patient scheduled for HTN, Hyperlipdemia 10/14/2019  Last labs 06/08/2019: Lipid, Liver, HgbA1c, Met 7

## 2019-10-07 NOTE — Telephone Encounter (Signed)
Blood work ordered in Epic. Left message to return call to notify patient. 

## 2019-10-08 DIAGNOSIS — E118 Type 2 diabetes mellitus with unspecified complications: Secondary | ICD-10-CM | POA: Diagnosis not present

## 2019-10-08 DIAGNOSIS — Z79899 Other long term (current) drug therapy: Secondary | ICD-10-CM | POA: Diagnosis not present

## 2019-10-08 DIAGNOSIS — I10 Essential (primary) hypertension: Secondary | ICD-10-CM | POA: Diagnosis not present

## 2019-10-08 DIAGNOSIS — Z794 Long term (current) use of insulin: Secondary | ICD-10-CM | POA: Diagnosis not present

## 2019-10-08 DIAGNOSIS — E785 Hyperlipidemia, unspecified: Secondary | ICD-10-CM | POA: Diagnosis not present

## 2019-10-09 LAB — BASIC METABOLIC PANEL
BUN/Creatinine Ratio: 18 (ref 10–24)
BUN: 20 mg/dL (ref 8–27)
CO2: 24 mmol/L (ref 20–29)
Calcium: 9.8 mg/dL (ref 8.6–10.2)
Chloride: 101 mmol/L (ref 96–106)
Creatinine, Ser: 1.09 mg/dL (ref 0.76–1.27)
GFR calc Af Amer: 74 mL/min/{1.73_m2} (ref >59)
GFR calc non Af Amer: 64 mL/min/{1.73_m2} (ref >59)
Glucose: 113 mg/dL — ABNORMAL HIGH (ref 65–99)
Potassium: 4.9 mmol/L (ref 3.5–5.2)
Sodium: 138 mmol/L (ref 134–144)

## 2019-10-09 LAB — LIPID PANEL
Chol/HDL Ratio: 2.7 ratio (ref 0.0–5.0)
Cholesterol, Total: 106 mg/dL (ref 100–199)
HDL: 40 mg/dL (ref >39)
LDL Chol Calc (NIH): 51 mg/dL (ref 0–99)
Triglycerides: 69 mg/dL (ref 0–149)
VLDL Cholesterol Cal: 15 mg/dL (ref 5–40)

## 2019-10-09 LAB — HEPATIC FUNCTION PANEL
ALT: 15 IU/L (ref 0–44)
AST: 16 IU/L (ref 0–40)
Albumin: 4.4 g/dL (ref 3.7–4.7)
Alkaline Phosphatase: 86 IU/L (ref 48–121)
Bilirubin Total: 0.6 mg/dL (ref 0.0–1.2)
Bilirubin, Direct: 0.17 mg/dL (ref 0.00–0.40)
Total Protein: 6.6 g/dL (ref 6.0–8.5)

## 2019-10-09 LAB — HEMOGLOBIN A1C
Est. average glucose Bld gHb Est-mCnc: 114 mg/dL
Hgb A1c MFr Bld: 5.6 % (ref 4.8–5.6)

## 2019-10-14 ENCOUNTER — Encounter: Payer: Self-pay | Admitting: Family Medicine

## 2019-10-14 ENCOUNTER — Other Ambulatory Visit: Payer: Self-pay

## 2019-10-14 ENCOUNTER — Ambulatory Visit (INDEPENDENT_AMBULATORY_CARE_PROVIDER_SITE_OTHER): Payer: Medicare Other | Admitting: Family Medicine

## 2019-10-14 VITALS — BP 136/86 | HR 75 | Temp 97.6°F | Ht 70.0 in | Wt 183.6 lb

## 2019-10-14 DIAGNOSIS — D692 Other nonthrombocytopenic purpura: Secondary | ICD-10-CM

## 2019-10-14 DIAGNOSIS — I10 Essential (primary) hypertension: Secondary | ICD-10-CM | POA: Diagnosis not present

## 2019-10-14 DIAGNOSIS — E119 Type 2 diabetes mellitus without complications: Secondary | ICD-10-CM

## 2019-10-14 DIAGNOSIS — E7801 Familial hypercholesterolemia: Secondary | ICD-10-CM

## 2019-10-14 DIAGNOSIS — F5101 Primary insomnia: Secondary | ICD-10-CM

## 2019-10-14 DIAGNOSIS — I679 Cerebrovascular disease, unspecified: Secondary | ICD-10-CM

## 2019-10-14 MED ORDER — SIMVASTATIN 80 MG PO TABS: 80.0000 mg | ORAL_TABLET | Freq: Every day | ORAL | 5 refills | Status: DC

## 2019-10-14 MED ORDER — CLOPIDOGREL BISULFATE 75 MG PO TABS: 75.0000 mg | ORAL_TABLET | Freq: Every day | ORAL | 5 refills | Status: DC

## 2019-10-14 NOTE — Progress Notes (Signed)
Patient ID: [name], male    DOB: 1940/07/08, 79 y.o.   MRN: [id number]   Chief Complaint  Patient presents with  . Follow-up    Pt here today for medication follow up. Pt states all meds are the same. No issues or concerns. Pt does see [facility] once a year for anuerysm. Pt sees Dr.Dixon and states that he needs to get an appointment with them.    Subjective:    HPI Pt seen for f/u on DM2 and insomnia. Pt also mentioning needing an appt for Dr. Scot Dock with vascular they are following his AAA.  Pt has long history of insomnia- pt taking temazepam 52m, 219mpamelor, and 31m62manax.   Pt has h/o CVA- taking plavix, doing okay other than some small bruising on the arms bilaterally.  No black or bloody stools.   HLD- no chest pain, myalgia, sob, or leg pain. Taking 25m65mcor daily.  No SEs.   DM2- diet controlled, no new issues.  Morning fasting today was 96.  Watching his diet.  Not needing medications.  Medical History [place] a past medical history of Abdominal aortic aneurysm (HCC)Parmadenomatous polyps, Arteriosclerotic cardiovascular disease (ASCVD) (2006), CAD (coronary artery disease), Carotid stenosis, Cerebrovascular disease (2007), Degenerative joint disease of spine (2011), DIABETES MELLITUS, TYPE II (01/31/2010), Diverticulosis, ED (erectile dysfunction), Hyperlipidemia, Hypertension, Insomnia, Neuropathy, Peripheral vascular disease (HCC)[place], Stroke (HCC)[place] Tobacco abuse, in remission.   Outpatient Encounter Medications as of 10/14/2019  Medication Sig  . ALPRAZolam (XANAX) 1 MG tablet TAKE 1 TABLET BY MOUTH AT BEDTIME.  . blood glucose meter kit and supplies KIT Dispense based on patient and insurance preference. Use up to four times daily to check your blood sugar (ICD-10 code E11.9)  . clopidogrel (PLAVIX) 75 MG tablet TAKE 1 TABLET ONCE DAILY.  . liMarland Kitcheninopril (ZESTRIL) 20 MG tablet TAKE (1) TABLET TWICE DAILY.  . Multiple Vitamin  (MULTIVITAMIN) tablet Take 1 tablet by mouth daily.    . nortriptyline (PAMELOR) 25 MG capsule TAKE 1 CAPSULE BY MOUTH AT BEDTIME.  . OmMarland Kitchenga-3 Fatty Acids (FISH OIL) 1000 MG CAPS Take 1 capsule by mouth daily.    . simvastatin (ZOCOR) 80 MG tablet TAKE 1 TABLET BY MOUTH ONCE DAILY.  . teMarland Kitchenazepam (RESTORIL) 30 MG capsule TAKE 1 CAPSULE BY MOUTH AT BEDTIME.  . [DISCONTINUED] amoxicillin (AMOXIL) 500 MG capsule Take 1 capsule (500 mg total) by mouth 3 (three) times daily.   No facility-administered encounter medications on file as of 10/14/2019.     Review of Systems  Constitutional: Negative for chills and fever.  HENT: Negative for congestion, rhinorrhea and sore throat.   Respiratory: Negative for cough, shortness of breath and wheezing.   Cardiovascular: Negative for chest pain and leg swelling.  Gastrointestinal: Negative for abdominal pain, diarrhea, nausea and vomiting.  Genitourinary: Negative for dysuria and frequency.  Skin: Negative for rash.  Neurological: Negative for dizziness, weakness and headaches.  Hematological: Bruises/bleeds easily.  Psychiatric/Behavioral: Positive for sleep disturbance.     Vitals BP 136/86   Pulse 75   Temp 97.6 F (36.4 C)   Ht _0  (1.778 m)   Wt 183 lb 9.6 oz (83.3 kg)   BMI 26.34 kg/m   Objective:   Physical Exam Constitutional:      Appearance: Normal appearance.  HENT:     Head: Normocephalic.     Nose: Nose normal. No congestion.     Mouth/Throat:     Mouth:  Mucous membranes are moist.     Pharynx: No oropharyngeal exudate.  Eyes:     Extraocular Movements: Extraocular movements intact.     Conjunctiva/sclera: Conjunctivae normal.     Pupils: Pupils are equal, round, and reactive to light.  Cardiovascular:     Rate and Rhythm: Normal rate and regular rhythm.     Pulses: Normal pulses.     Heart sounds: Normal heart sounds. No murmur.  Pulmonary:     Effort: Pulmonary effort is normal.     Breath sounds: Normal breath  sounds. No wheezing, rhonchi or rales.  Musculoskeletal:        General: Normal range of motion.     Right lower leg: No edema.     Left lower leg: No edema.  Skin:    General: Skin is warm and dry.     Findings: Bruising (bilateral forearms) present. No rash.  Neurological:     General: No focal deficit present.     Mental Status: He is alert and oriented to person, place, and time.  Psychiatric:        Mood and Affect: Mood normal.        Behavior: Behavior normal.      Assessment and Plan   1. 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 Cerebrovascular disease  4. Essential hypertension  5. Familial hypercholesterolemia  6. Primary insomnia   DM2- stable, improved. diet controlled.  a1c- 5.6.  Insomnia- Long discussion about risk vs. Benefits of taking benzodiazepines long term in pt over 7 yrs old and would be good to decrease these or taper off of these medications.   -will try to taper xanax 0.68m qhs. cont with temazepam and pamelor qhs.  -will try to do a slow taper over time. Pt in agreement.  CVA- stable. cont plavix, zocor HTN-stable. cont with lisinopril.  -HLD- cont zocor. Stable.  Labs reviewed.  F/u [address] routine check up.  Also in 4 wks for recheck on his insomnia.

## 2019-11-11 ENCOUNTER — Telehealth: Payer: Medicare Other | Admitting: Family Medicine

## 2019-12-02 ENCOUNTER — Other Ambulatory Visit: Payer: Self-pay

## 2019-12-02 ENCOUNTER — Ambulatory Visit (INDEPENDENT_AMBULATORY_CARE_PROVIDER_SITE_OTHER): Payer: Medicare Other | Admitting: Family Medicine

## 2019-12-02 VITALS — BP 126/80 | HR 89 | Temp 97.8°F | Ht 70.0 in | Wt 181.6 lb

## 2019-12-02 DIAGNOSIS — R7301 Impaired fasting glucose: Secondary | ICD-10-CM

## 2019-12-02 DIAGNOSIS — E7801 Familial hypercholesterolemia: Secondary | ICD-10-CM

## 2019-12-02 DIAGNOSIS — F5101 Primary insomnia: Secondary | ICD-10-CM

## 2019-12-02 DIAGNOSIS — I10 Essential (primary) hypertension: Secondary | ICD-10-CM

## 2019-12-02 MED ORDER — TEMAZEPAM 30 MG PO CAPS: 30.0000 mg | ORAL_CAPSULE | Freq: Every day | ORAL | 2 refills | Status: DC

## 2019-12-02 MED ORDER — BLOOD GLUCOSE MONITOR KIT: PACK | 0 refills | Status: DC

## 2019-12-02 MED ORDER — ALPRAZOLAM 0.5 MG PO TABS
0.5000 mg | ORAL_TABLET | Freq: Every evening | ORAL | 2 refills | Status: DC | PRN
Start: 2019-12-02 — End: 2020-03-08

## 2019-12-02 NOTE — Progress Notes (Signed)
Patient ID: [name], male    DOB: 10-16-1940, 79 y.o.   MRN: [id number]   Chief Complaint  Patient presents with  . discuss insomnia   Subjective:    HPI  Anxiety and insomnia- Pt has tried to cut in xanax in 1/2, pt reporting- "didn't work as well."  But is willing to keep trying. Not needing refill at this time. Pick up was 11/30/19. Pt still taking temazepam and nortriptyline.  Impaired fasting bg-bg at 113 on last labs.  Spaghetti- 103 this am. Crackers and cheese. Not eating lots of bread. Small amt of snacks.   Medical History Jatin has a past medical history of Abdominal aortic aneurysm ([place]), Adenomatous polyps, Arteriosclerotic cardiovascular disease (ASCVD) (2006), CAD (coronary artery disease), Carotid stenosis, Cerebrovascular disease (2007), Degenerative joint disease of spine (2011), DIABETES MELLITUS, TYPE II (01/31/2010), Diverticulosis, ED (erectile dysfunction), Hyperlipidemia, Hypertension, Insomnia, Neuropathy, Peripheral vascular disease ([place]), Sinus headache, Stroke ([place]), and Tobacco abuse, in remission.   Outpatient Encounter Medications as of 12/02/2019  Medication Sig  . ALPRAZolam (XANAX) 0.5 MG tablet Take 1 tablet (0.5 mg total) by mouth at bedtime as needed for anxiety.  . blood glucose meter kit and supplies KIT Dispense based on patient and insurance preference. Use up to four times daily to check your blood sugar (ICD-10 code E11.9)  . clopidogrel (PLAVIX) 75 MG tablet Take 1 tablet (75 mg total) by mouth daily.  Marland Kitchen lisinopril (ZESTRIL) 20 MG tablet TAKE (1) TABLET TWICE DAILY.  . Multiple Vitamin (MULTIVITAMIN) tablet Take 1 tablet by mouth daily.    . nortriptyline (PAMELOR) 25 MG capsule TAKE 1 CAPSULE BY MOUTH AT BEDTIME.  Marland Kitchen Omega-3 Fatty Acids (FISH OIL) 1000 MG CAPS Take 1 capsule by mouth daily.    . simvastatin (ZOCOR) 80 MG tablet Take 1 tablet (80 mg total) by mouth daily.  . temazepam (RESTORIL) 30 MG capsule Take 1 capsule (30 mg  total) by mouth at bedtime.  . [DISCONTINUED] ALPRAZolam (XANAX) 1 MG tablet TAKE 1 TABLET BY MOUTH AT BEDTIME.  . [DISCONTINUED] blood glucose meter kit and supplies KIT Dispense based on patient and insurance preference. Use up to four times daily to check your blood sugar (ICD-10 code E11.9)  . [DISCONTINUED] temazepam (RESTORIL) 30 MG capsule TAKE 1 CAPSULE BY MOUTH AT BEDTIME.   No facility-administered encounter medications on file as of 12/02/2019.     Review of Systems  Constitutional: Negative for chills and fever.  HENT: Negative for congestion, rhinorrhea and sore throat.   Respiratory: Negative for cough, shortness of breath and wheezing.   Cardiovascular: Negative for chest pain and leg swelling.  Gastrointestinal: Negative for abdominal pain, diarrhea, nausea and vomiting.  Genitourinary: Negative for dysuria and frequency.  Skin: Negative for rash.  Neurological: Negative for dizziness, weakness and headaches.  Psychiatric/Behavioral: Positive for sleep disturbance. Negative for dysphoric mood and suicidal ideas. The patient is not nervous/anxious.      Vitals BP 126/80   Pulse 89   Temp 97.8 F (36.6 C) (Oral)   Ht _0  (1.778 m)   Wt 181 lb 9.6 oz (82.4 kg)   SpO2 98%   BMI 26.06 kg/m   Objective:   Physical Exam Vitals and nursing note reviewed.  Constitutional:      General: He is not in acute distress.    Appearance: Normal appearance. He is not ill-appearing.  HENT:     Head: Normocephalic.     Nose: Nose normal. No congestion.  Mouth/Throat:     Mouth: Mucous membranes are moist.     Pharynx: No oropharyngeal exudate.  Eyes:     Extraocular Movements: Extraocular movements intact.     Conjunctiva/sclera: Conjunctivae normal.     Pupils: Pupils are equal, round, and reactive to light.  Cardiovascular:     Rate and Rhythm: Normal rate and regular rhythm.     Pulses: Normal pulses.     Heart sounds: Normal heart sounds. No murmur heard.    Pulmonary:     Effort: Pulmonary effort is normal.     Breath sounds: Normal breath sounds. No wheezing, rhonchi or rales.  Musculoskeletal:        General: Normal range of motion.     Right lower leg: No edema.     Left lower leg: No edema.  Skin:    General: Skin is warm and dry.     Findings: No rash.  Neurological:     General: No focal deficit present.     Mental Status: He is alert and oriented to person, place, and time.     Cranial Nerves: No cranial nerve deficit.  Psychiatric:        Mood and Affect: Mood normal.        Behavior: Behavior normal.        Thought Content: Thought content normal.        Judgment: Judgment normal.      Assessment and Plan   1. Primary insomnia  2. Essential hypertension - CBC - CMP14+EGFR  3. Familial hypercholesterolemia - Lipid panel  4. Impaired fasting blood sugar - CMP14+EGFR - Hemoglobin A1c; Future   Insomnia/anxiety- Will try to take 0.11m xanax qhs, and work toward lowering this on next visit. Pt to continue with temazepam and pamelor.  Will recheck labs on next visit.  impaired fasting glucose- dec carb intake. For htn/hld- cont meds.  F/u 36mor prn.

## 2019-12-02 NOTE — Progress Notes (Signed)
   Patient ID: TASMAN ZAPATA, male    DOB: May 31, 1940, 79 y.o.   MRN: [id number]   Chief Complaint  Patient presents with  . discuss insomnia   Subjective:    HPI   Medical History Sandford has a past medical history of Abdominal aortic aneurysm ([place]), Adenomatous polyps, Arteriosclerotic cardiovascular disease (ASCVD) (2006), CAD (coronary artery disease), Carotid stenosis, Cerebrovascular disease (2007), Degenerative joint disease of spine (2011), DIABETES MELLITUS, TYPE II (01/31/2010), Diverticulosis, ED (erectile dysfunction), Hyperlipidemia, Hypertension, Insomnia, Neuropathy, Peripheral vascular disease ([place]), Sinus headache, Stroke ([place]), and Tobacco abuse, in remission.   Outpatient Encounter Medications as of 12/02/2019  Medication Sig  . ALPRAZolam (XANAX) 1 MG tablet TAKE 1 TABLET BY MOUTH AT BEDTIME.  . blood glucose meter kit and supplies KIT Dispense based on patient and insurance preference. Use up to four times daily to check your blood sugar (ICD-10 code E11.9)  . clopidogrel (PLAVIX) 75 MG tablet Take 1 tablet (75 mg total) by mouth daily.  Marland Kitchen lisinopril (ZESTRIL) 20 MG tablet TAKE (1) TABLET TWICE DAILY.  . Multiple Vitamin (MULTIVITAMIN) tablet Take 1 tablet by mouth daily.    . nortriptyline (PAMELOR) 25 MG capsule TAKE 1 CAPSULE BY MOUTH AT BEDTIME.  Marland Kitchen Omega-3 Fatty Acids (FISH OIL) 1000 MG CAPS Take 1 capsule by mouth daily.    . simvastatin (ZOCOR) 80 MG tablet Take 1 tablet (80 mg total) by mouth daily.  . temazepam (RESTORIL) 30 MG capsule TAKE 1 CAPSULE BY MOUTH AT BEDTIME.   No facility-administered encounter medications on file as of 12/02/2019.     Review of Systems   Vitals BP 126/80   Pulse 89   Temp 97.8 F (36.6 C) (Oral)   Ht _0  (1.778 m)   Wt 181 lb 9.6 oz (82.4 kg)   SpO2 98%   BMI 26.06 kg/m   Objective:   Physical Exam   Assessment and Plan   There are no diagnoses linked to this encounter.

## 2019-12-03 DIAGNOSIS — I10 Essential (primary) hypertension: Secondary | ICD-10-CM | POA: Diagnosis not present

## 2019-12-03 DIAGNOSIS — E7801 Familial hypercholesterolemia: Secondary | ICD-10-CM | POA: Diagnosis not present

## 2019-12-03 DIAGNOSIS — R7301 Impaired fasting glucose: Secondary | ICD-10-CM | POA: Diagnosis not present

## 2019-12-04 LAB — CMP14+EGFR
ALT: 15 IU/L (ref 0–44)
AST: 15 IU/L (ref 0–40)
Albumin/Globulin Ratio: 1.6 (ref 1.2–2.2)
Albumin: 4.4 g/dL (ref 3.7–4.7)
Alkaline Phosphatase: 86 IU/L (ref 48–121)
BUN/Creatinine Ratio: 16 (ref 10–24)
BUN: 20 mg/dL (ref 8–27)
Bilirubin Total: 0.7 mg/dL (ref 0.0–1.2)
CO2: 23 mmol/L (ref 20–29)
Calcium: 9.8 mg/dL (ref 8.6–10.2)
Chloride: 102 mmol/L (ref 96–106)
Creatinine, Ser: 1.28 mg/dL — ABNORMAL HIGH (ref 0.76–1.27)
GFR calc Af Amer: 61 mL/min/{1.73_m2} (ref >59)
GFR calc non Af Amer: 53 mL/min/{1.73_m2} — ABNORMAL LOW (ref >59)
Globulin, Total: 2.8 g/dL (ref 1.5–4.5)
Glucose: 118 mg/dL — ABNORMAL HIGH (ref 65–99)
Potassium: 5.6 mmol/L — ABNORMAL HIGH (ref 3.5–5.2)
Sodium: 139 mmol/L (ref 134–144)
Total Protein: 7.2 g/dL (ref 6.0–8.5)

## 2019-12-04 LAB — CBC
Hematocrit: 49.7 % (ref 37.5–51.0)
Hemoglobin: 16.9 g/dL (ref 13.0–17.7)
MCH: 32.3 pg (ref 26.6–33.0)
MCHC: 34 g/dL (ref 31.5–35.7)
MCV: 95 fL (ref 79–97)
Platelets: 172 10*3/uL (ref 150–450)
RBC: 5.24 x10E6/uL (ref 4.14–5.80)
RDW: 11.6 % (ref 11.6–15.4)
WBC: 7.7 10*3/uL (ref 3.4–10.8)

## 2019-12-04 LAB — LIPID PANEL
Chol/HDL Ratio: 2.6 ratio (ref 0.0–5.0)
Cholesterol, Total: 108 mg/dL (ref 100–199)
HDL: 42 mg/dL (ref >39)
LDL Chol Calc (NIH): 51 mg/dL (ref 0–99)
Triglycerides: 73 mg/dL (ref 0–149)
VLDL Cholesterol Cal: 15 mg/dL (ref 5–40)

## 2019-12-07 ENCOUNTER — Other Ambulatory Visit: Payer: Self-pay | Admitting: *Deleted

## 2019-12-07 DIAGNOSIS — R7989 Other specified abnormal findings of blood chemistry: Secondary | ICD-10-CM

## 2019-12-07 DIAGNOSIS — E875 Hyperkalemia: Secondary | ICD-10-CM

## 2019-12-11 DIAGNOSIS — E875 Hyperkalemia: Secondary | ICD-10-CM | POA: Diagnosis not present

## 2019-12-11 DIAGNOSIS — R7989 Other specified abnormal findings of blood chemistry: Secondary | ICD-10-CM | POA: Diagnosis not present

## 2019-12-12 LAB — BASIC METABOLIC PANEL
BUN/Creatinine Ratio: 22 (ref 10–24)
BUN: 23 mg/dL (ref 8–27)
CO2: 26 mmol/L (ref 20–29)
Calcium: 9.2 mg/dL (ref 8.6–10.2)
Chloride: 102 mmol/L (ref 96–106)
Creatinine, Ser: 1.06 mg/dL (ref 0.76–1.27)
GFR calc Af Amer: 76 mL/min/{1.73_m2} (ref >59)
GFR calc non Af Amer: 66 mL/min/{1.73_m2} (ref >59)
Glucose: 136 mg/dL — ABNORMAL HIGH (ref 65–99)
Potassium: 4.6 mmol/L (ref 3.5–5.2)
Sodium: 140 mmol/L (ref 134–144)

## 2019-12-14 DIAGNOSIS — I739 Peripheral vascular disease, unspecified: Secondary | ICD-10-CM | POA: Diagnosis not present

## 2019-12-14 DIAGNOSIS — B351 Tinea unguium: Secondary | ICD-10-CM | POA: Diagnosis not present

## 2019-12-14 DIAGNOSIS — M79674 Pain in right toe(s): Secondary | ICD-10-CM | POA: Diagnosis not present

## 2019-12-14 DIAGNOSIS — M79675 Pain in left toe(s): Secondary | ICD-10-CM | POA: Diagnosis not present

## 2019-12-29 ENCOUNTER — Other Ambulatory Visit: Payer: Self-pay | Admitting: Family Medicine

## 2019-12-29 NOTE — Telephone Encounter (Signed)
Ask about the insomnia, how is the sleep going with tapering down on the xanax at night?  If going well, we need to start to taper down on the other medications.   Thx,   Dr. Lovena Le

## 2020-02-16 DIAGNOSIS — L309 Dermatitis, unspecified: Secondary | ICD-10-CM | POA: Diagnosis not present

## 2020-02-16 DIAGNOSIS — I780 Hereditary hemorrhagic telangiectasia: Secondary | ICD-10-CM | POA: Diagnosis not present

## 2020-02-18 DIAGNOSIS — T162XXA Foreign body in left ear, initial encounter: Secondary | ICD-10-CM | POA: Insufficient documentation

## 2020-02-18 DIAGNOSIS — H903 Sensorineural hearing loss, bilateral: Secondary | ICD-10-CM | POA: Diagnosis not present

## 2020-02-18 DIAGNOSIS — H9202 Otalgia, left ear: Secondary | ICD-10-CM | POA: Insufficient documentation

## 2020-02-18 DIAGNOSIS — Z974 Presence of external hearing-aid: Secondary | ICD-10-CM | POA: Diagnosis not present

## 2020-02-22 DIAGNOSIS — M79674 Pain in right toe(s): Secondary | ICD-10-CM | POA: Diagnosis not present

## 2020-02-22 DIAGNOSIS — M79675 Pain in left toe(s): Secondary | ICD-10-CM | POA: Diagnosis not present

## 2020-02-22 DIAGNOSIS — B351 Tinea unguium: Secondary | ICD-10-CM | POA: Diagnosis not present

## 2020-02-22 DIAGNOSIS — I739 Peripheral vascular disease, unspecified: Secondary | ICD-10-CM | POA: Diagnosis not present

## 2020-02-24 ENCOUNTER — Other Ambulatory Visit: Payer: Self-pay | Admitting: Cardiology

## 2020-02-24 ENCOUNTER — Ambulatory Visit (INDEPENDENT_AMBULATORY_CARE_PROVIDER_SITE_OTHER): Payer: Medicare Other

## 2020-02-24 DIAGNOSIS — I6523 Occlusion and stenosis of bilateral carotid arteries: Secondary | ICD-10-CM | POA: Diagnosis not present

## 2020-03-03 ENCOUNTER — Ambulatory Visit: Payer: Medicare Other | Admitting: Family Medicine

## 2020-03-07 ENCOUNTER — Telehealth: Payer: Self-pay | Admitting: *Deleted

## 2020-03-07 NOTE — Telephone Encounter (Signed)
Laurine Blazer, LPN  75/44/9201 00:71 AM EDT Back to Top    Notified, copy to pcp. Was due for 1 year follow up 06/2019 - was a former [facility] pt. Stated that he will wait till after the first of next year to call back for appointment. Offered to schedule now, patient declined at this time.    Laurine Blazer, LPN  21/97/5883 2:54 PM EDT     Left message to return call.   Erma Heritage, PA-C  02/25/2020 10:16 AM EDT     Former Patient of Dr. Bronson Ing - Please let the patient know his carotid doppler study shows progression of plaque along the right carotid artery. This was previously at 40-59%, now at 60-79%. Less than 1-39% plaque along the left carotid. Repeat study recommended in 1 year. He is already on Plavix and statin therapy. Also already established with Dr. Scot Dock from [facility].

## 2020-03-08 ENCOUNTER — Encounter: Payer: Self-pay | Admitting: Family Medicine

## 2020-03-08 ENCOUNTER — Other Ambulatory Visit: Payer: Self-pay

## 2020-03-08 ENCOUNTER — Ambulatory Visit (INDEPENDENT_AMBULATORY_CARE_PROVIDER_SITE_OTHER): Payer: Medicare Other | Admitting: Family Medicine

## 2020-03-08 VITALS — BP 132/80 | HR 95 | Temp 97.8°F | Ht 70.0 in | Wt 181.0 lb

## 2020-03-08 DIAGNOSIS — F5101 Primary insomnia: Secondary | ICD-10-CM | POA: Diagnosis not present

## 2020-03-08 DIAGNOSIS — Z23 Encounter for immunization: Secondary | ICD-10-CM | POA: Diagnosis not present

## 2020-03-08 DIAGNOSIS — I10 Essential (primary) hypertension: Secondary | ICD-10-CM | POA: Diagnosis not present

## 2020-03-08 MED ORDER — ALPRAZOLAM 0.5 MG PO TABS: 0.5000 mg | ORAL_TABLET | Freq: Every evening | ORAL | 2 refills | Status: DC | PRN

## 2020-03-08 MED ORDER — TEMAZEPAM 30 MG PO CAPS: 30.0000 mg | ORAL_CAPSULE | Freq: Every day | ORAL | 2 refills | Status: DC

## 2020-03-08 NOTE — Progress Notes (Signed)
Patient ID: [name], male    DOB: [birth year], 79 y.o.   MRN: [id number]   Chief Complaint  Patient presents with  . Insomnia   Subjective:    HPI  F/u insomnia and Med check up.   No concerns today. Would like a senior flu vaccine.   Pt stating doing better now on sleep.  Going to bed about 11-12am, getting up at 8am. Taking his night medications from 10:30-11pm. Taking temazepam, xanax, and pamelor.  Has been taking all 3 meds since 2012, since this chart has been electronic.  Likely has been on these longer than this, but it was on paper chart.  Pt was on 90m xanax in 6/21.  Worked together to dec down to 0.545mxanax qhs.  Pt denies falls, memory concerns, or confusion. Pt wanting to continue with his sleep medications.   Medical History [place] a past medical history of Abdominal aortic aneurysm ([place] Adenomatous polyps, Arteriosclerotic cardiovascular disease (ASCVD) (2006), CAD (coronary artery disease), Carotid stenosis, Cerebrovascular disease (2007), Degenerative joint disease of spine (2011), DIABETES MELLITUS, TYPE II (01/31/2010), Diverticulosis, ED (erectile dysfunction), Hyperlipidemia, Hypertension, Insomnia, Neuropathy, Peripheral vascular disease ([facility] Sinus headache, Stroke ([facility] and Tobacco abuse, in remission.   Outpatient Encounter Medications as of 03/08/2020  Medication Sig  . ALPRAZolam (XANAX) 0.5 MG tablet Take 1 tablet (0.5 mg total) by mouth at bedtime as needed for anxiety.  . blood glucose meter kit and supplies KIT Dispense based on patient and insurance preference. Use up to four times daily to check your blood sugar (ICD-10 code E11.9)  . clopidogrel (PLAVIX) 75 MG tablet TAKE 1 TABLET ONCE DAILY.  . Marland Kitchenisinopril (ZESTRIL) 20 MG tablet TAKE (1) TABLET TWICE DAILY.  . Multiple Vitamin (MULTIVITAMIN) tablet Take 1 tablet by mouth daily.    . nortriptyline (PAMELOR) 25 MG capsule TAKE 1 CAPSULE BY MOUTH AT BEDTIME.  . Marland Kitchenmega-3 Fatty Acids  (FISH OIL) 1000 MG CAPS Take 1 capsule by mouth daily.    . simvastatin (ZOCOR) 80 MG tablet TAKE 1 TABLET ONCE DAILY.  . Marland Kitchenemazepam (RESTORIL) 30 MG capsule Take 1 capsule (30 mg total) by mouth at bedtime.  . [DISCONTINUED] ALPRAZolam (XANAX) 0.5 MG tablet Take 1 tablet (0.5 mg total) by mouth at bedtime as needed for anxiety.  . [DISCONTINUED] temazepam (RESTORIL) 30 MG capsule Take 1 capsule (30 mg total) by mouth at bedtime.   No facility-administered encounter medications on file as of 03/08/2020.     Review of Systems  Constitutional: Negative for chills and fever.  HENT: Negative for congestion, rhinorrhea and sore throat.   Respiratory: Negative for cough, shortness of breath and wheezing.   Cardiovascular: Negative for chest pain and leg swelling.  Gastrointestinal: Negative for abdominal pain, diarrhea, nausea and vomiting.  Genitourinary: Negative for dysuria and frequency.  Skin: Negative for rash.  Neurological: Negative for dizziness, weakness and headaches.  Psychiatric/Behavioral: Positive for sleep disturbance (controlled on meds). Negative for dysphoric mood. The patient is not nervous/anxious.      Vitals BP 132/80   Pulse 95   Temp 97.8 F (36.6 C)   Ht _0  (1.778 m)   Wt 181 lb (82.1 kg)   SpO2 98%   BMI 25.97 kg/m   Objective:   Physical Exam Vitals and nursing note reviewed.  Constitutional:      General: He is not in acute distress.    Appearance: Normal appearance. He is not ill-appearing.  HENT:  Head: Normocephalic.     Nose: Nose normal. No congestion.     Mouth/Throat:     Mouth: Mucous membranes are moist.     Pharynx: No oropharyngeal exudate.  Eyes:     Extraocular Movements: Extraocular movements intact.     Conjunctiva/sclera: Conjunctivae normal.     Pupils: Pupils are equal, round, and reactive to light.  Cardiovascular:     Rate and Rhythm: Normal rate and regular rhythm.     Pulses: Normal pulses.     Heart sounds:  Normal heart sounds. No murmur heard.   Pulmonary:     Effort: Pulmonary effort is normal.     Breath sounds: Normal breath sounds. No wheezing, rhonchi or rales.  Musculoskeletal:        General: Normal range of motion.     Right lower leg: No edema.     Left lower leg: No edema.  Skin:    General: Skin is warm and dry.     Findings: No rash.  Neurological:     General: No focal deficit present.     Mental Status: He is alert and oriented to person, place, and time.     Cranial Nerves: No cranial nerve deficit.  Psychiatric:        Mood and Affect: Mood normal.        Behavior: Behavior normal.        Thought Content: Thought content normal.        Judgment: Judgment normal.      Assessment and Plan   1. Primary insomnia - ALPRAZolam (XANAX) 0.5 MG tablet; Take 1 tablet (0.5 mg total) by mouth at bedtime as needed for anxiety.  Dispense: 30 tablet; Refill: 2 - temazepam (RESTORIL) 30 MG capsule; Take 1 capsule (30 mg total) by mouth at bedtime.  Dispense: 30 capsule; Refill: 2  2. Need for vaccination - Flu Vaccine QUAD High Dose(Fluad)  3. Primary hypertension - CBC - CMP14+EGFR - Lipid panel   Insomnia-  Pt doing well with taking 1/2 dose of xanax from 73m down to 0.573m  Pt stating still needing to take the temazepam 3017mhs. Is getting good amt of sleep. Would like to continue on these doses.  Not having any side effects, falls, or memory issues.  Will discuss next visit trying to either reduce amt xanax to 15 tab a month or dec the temazepam to 1m14mblet.   htn-stable. Cont meds.-lisinopril.   F/u 83mo 683morn.

## 2020-03-08 NOTE — Patient Instructions (Signed)
[phone] [place] nubmer for covid booster.

## 2020-03-12 DIAGNOSIS — Z23 Encounter for immunization: Secondary | ICD-10-CM | POA: Diagnosis not present

## 2020-03-28 DIAGNOSIS — H903 Sensorineural hearing loss, bilateral: Secondary | ICD-10-CM | POA: Diagnosis not present

## 2020-04-01 ENCOUNTER — Other Ambulatory Visit: Payer: Self-pay | Admitting: Family Medicine

## 2020-04-26 ENCOUNTER — Other Ambulatory Visit: Payer: Self-pay

## 2020-04-26 DIAGNOSIS — I739 Peripheral vascular disease, unspecified: Secondary | ICD-10-CM

## 2020-04-26 DIAGNOSIS — I714 Abdominal aortic aneurysm, without rupture, unspecified: Secondary | ICD-10-CM

## 2020-05-04 ENCOUNTER — Ambulatory Visit (INDEPENDENT_AMBULATORY_CARE_PROVIDER_SITE_OTHER): Payer: Medicare Other | Admitting: Vascular Surgery

## 2020-05-04 ENCOUNTER — Other Ambulatory Visit: Payer: Self-pay

## 2020-05-04 ENCOUNTER — Ambulatory Visit (HOSPITAL_COMMUNITY)
Admission: RE | Admit: 2020-05-04 | Discharge: 2020-05-04 | Disposition: A | Discharge status: Home / Self Care | Payer: Medicare Other | Source: Ambulatory Visit | Attending: Family Medicine | Admitting: Family Medicine

## 2020-05-04 ENCOUNTER — Encounter: Payer: Self-pay | Admitting: Vascular Surgery

## 2020-05-04 ENCOUNTER — Ambulatory Visit (INDEPENDENT_AMBULATORY_CARE_PROVIDER_SITE_OTHER)
Admission: RE | Admit: 2020-05-04 | Discharge: 2020-05-04 | Disposition: A | Discharge status: Home / Self Care | Payer: Medicare Other | Source: Ambulatory Visit | Attending: Family Medicine | Admitting: Family Medicine

## 2020-05-04 VITALS — BP 128/82 | HR 73 | Temp 98.0°F | Resp 20 | Ht 70.0 in | Wt 179.0 lb

## 2020-05-04 DIAGNOSIS — I739 Peripheral vascular disease, unspecified: Secondary | ICD-10-CM

## 2020-05-04 DIAGNOSIS — I714 Abdominal aortic aneurysm, without rupture, unspecified: Secondary | ICD-10-CM

## 2020-05-04 DIAGNOSIS — I6523 Occlusion and stenosis of bilateral carotid arteries: Secondary | ICD-10-CM | POA: Diagnosis not present

## 2020-05-04 NOTE — Progress Notes (Signed)
REASON FOR VISIT:   Follow-up of abdominal aortic aneurysm and peripheral vascular disease  MEDICAL ISSUES:   ABDOMINAL AORTIC ANEURYSM: His aneurysm is enlarged to 6 cm.  Thus I think we should consider elective repair given the risk of rupture.  His risk of rupture for an aneurysm of this size is 10 to 15 %/year.  Based on his CT scan in 2019 the neck was fairly short and I am not sure that he is a candidate for endovascular repair.  I have ordered a CT angiogram with 1 mm cuts will which will help [place] determine if he is a candidate for endovascular aneurysm repair.  Otherwise he would require open repair which would certainly be associated with increased risk given that he has had bilateral renal artery stents, may potentially require a suprarenal clamp, and he is 79 years old.  We will also obtain preoperative cardiac evaluation.  He has previously been seen by Dr. Bronson Ing in [place].  I will then see him back after his work-up to discuss aneurysm repair.  PERIPHERAL VASCULAR DISEASE: He has stable claudication of the left lower extremity.  He is not a smoker.  I encouraged him to stay as active as possible.    HPI:   [name] is a pleasant 79 y.o. male who I been following with an abdominal aortic aneurysm.  When I saw him last on 03/11/2019 the aneurysm had enlarged to 5.1 cm.  He comes in for a routine 11-monthfollow-up visit.  Since I saw him last he is had no significant abdominal pain or back pain.  He does have stable claudication of left calf which occurs in 100 yards.  His symptoms are brought on by ambulation and relieved with rest.  He is not a smoker.  He quit in 2005.  He has undergone previous coronary revascularization.  He denies any previous history of myocardial infarction or history of congestive heart failure.  He denies any recent chest pain or shortness of breath.  He denies dyspnea on exertion.  He does state that he had a TIA 7 or 8 years ago.  He is on  Plavix and a statin.  He is not on aspirin.  Past Medical History:  Diagnosis Date  . Abdominal aortic aneurysm (H[facility])    3.5 cm in 2011  . Adenomatous polyps   . Arteriosclerotic cardiovascular disease (ASCVD) 2006   CABG in 2006; normal EF  . CAD (coronary artery disease)   . Carotid stenosis   . 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 Degenerative joint disease of spine 2011   Lumbosacral and cervical; ant. discectomy, fusion and plate in [address]010 . DIABETES MELLITUS, TYPE II 01/31/2010  . Diverticulosis   . ED (erectile dysfunction)   . Hyperlipidemia   . Hypertension    LVH  . Insomnia   . Neuropathy   . Peripheral vascular disease ([place]    Left femoral art. stent; bilateral renal art. stents-2007; ABIs-nl right; 0.80 left in 5/06; angio in 2007-95% bilat. renal; diffuse aortoiliac ASCVD +ulceration;     . Sinus headache   . Stroke ([place] [place]   . Tobacco abuse, in remission    [id number]   Family History  Problem Relation Age of Onset  . Stroke Father   . Pulmonary embolism Mother        Following hip fracture  . Colon  cancer Brother   . Hypertension Brother     SOCIAL HISTORY: Social History   Tobacco Use  . Smoking status: Former Smoker    Packs/day: 1.00    Years: 50.00    Pack years: 50.00    Types: Cigarettes    Start date: 05/10/1952    Quit date: 10/18/2003    Years since quitting: 16.5  . Smokeless tobacco: Never Used  . Tobacco comment: 50 pack years; quit in 2005   Substance Use Topics  . Alcohol use: No    Alcohol/week: 0.0 standard drinks    Allergies  Allergen Reactions  . Atorvastatin Other (See Comments)    Side effects-muscle aches Other reaction(s): Other (See Comments) Side effects-muscle aches Side effects-muscle aches    Current Outpatient Medications  Medication Sig Dispense Refill  . ALPRAZolam (XANAX) 0.5 MG tablet Take 1 tablet (0.5 mg total) by mouth  at bedtime as needed for anxiety. 30 tablet 2  . blood glucose meter kit and supplies KIT Dispense based on patient and insurance preference. Use up to four times daily to check your blood sugar (ICD-10 code E11.9) 1 each 0  . clopidogrel (PLAVIX) 75 MG tablet TAKE 1 TABLET BY MOUTH ONCE DAILY. 90 tablet 1  . lisinopril (ZESTRIL) 20 MG tablet TAKE (1) TABLET TWICE DAILY. 180 tablet 1  . Multiple Vitamin (MULTIVITAMIN) tablet Take 1 tablet by mouth daily.    . nortriptyline (PAMELOR) 25 MG capsule TAKE 1 CAPSULE BY MOUTH AT BEDTIME. 90 capsule 1  . Omega-3 Fatty Acids (FISH OIL) 1000 MG CAPS Take 1 capsule by mouth daily.    . simvastatin (ZOCOR) 80 MG tablet TAKE 1 TABLET ONCE DAILY. 90 tablet 1  . temazepam (RESTORIL) 30 MG capsule Take 1 capsule (30 mg total) by mouth at bedtime. 30 capsule 2   No current facility-administered medications for this visit.    REVIEW OF SYSTEMS:  [X]  denotes positive finding, [ ]  denotes negative finding Cardiac  Comments:  Chest pain or chest pressure:    Shortness of breath upon exertion:    Short of breath when lying flat:    Irregular heart rhythm:        Vascular    Pain in calf, thigh, or hip brought on by ambulation:    Pain in feet at night that wakes you up from your sleep:     Blood clot in your veins:    Leg swelling:         Pulmonary    Oxygen at home:    Productive cough:     Wheezing:         Neurologic    Sudden weakness in arms or legs:     Sudden numbness in arms or legs:     Sudden onset of difficulty speaking or slurred speech:    Temporary loss of vision in one eye:     Problems with dizziness:         Gastrointestinal    Blood in stool:     Vomited blood:         Genitourinary    Burning when urinating:     Blood in urine:        Psychiatric    Major depression:         Hematologic    Bleeding problems:    Problems with blood clotting too easily:        Skin    Rashes or ulcers:  Constitutional     Fever or chills:     PHYSICAL EXAM:   Vitals:   05/04/20 0906  BP: 128/82  Pulse: 73  Resp: 20  Temp: 98 F (36.7 C)  SpO2: 96%  Weight: 179 lb (81.2 kg)  Height: 5' 10"  (1.778 m)    GENERAL: The patient is a well-nourished male, in no acute distress. The vital signs are documented above. CARDIAC: There is a regular rate and rhythm.  VASCULAR: I do not detect carotid bruits. On the right side he has a slightly diminished femoral pulse.  I cannot palpate pedal pulses. On the left side he has a palpable femoral pulse.  I cannot palpate pedal pulses. He has no significant lower extremity swelling. PULMONARY: There is good air exchange bilaterally without wheezing or rales. ABDOMEN: Soft and non-tender with normal pitched bowel sounds.  MUSCULOSKELETAL: There are no major deformities or cyanosis. NEUROLOGIC: No focal weakness or paresthesias are detected. SKIN: There are no ulcers or rashes noted. PSYCHIATRIC: The patient has a normal affect.  DATA:    ARTERIAL DOPPLER STUDY: I have independently interpreted his arterial Doppler study today.  On the right side there is a monophasic dorsalis pedis and posterior tibial signal.  ABI is 96%.  Toe pressure is 95 mmHg.  On the left side there is a monophasic peroneal and dorsalis pedis signal.  Posterior tibial signal cannot be obtained.  Ankle-brachial index is 64%.  Toe pressure is 27 mmHg.  DUPLEX ABDOMINAL AORTA: I have independently interpreted his duplex of the abdominal aorta.  The maximum diameter of his aneurysm is 6 cm which is increased from 5.1 cm in October 2020.  The right common iliac artery measures 1.7 cm in maximum diameter.  The left common iliac artery measures 2.0 cm in maximum diameter.  LABS: I reviewed his labs from 12/11/2019.  Creatinine 1.06.  His GFR was 66.   Deitra Mayo [facility] [phone]

## 2020-05-09 DIAGNOSIS — B351 Tinea unguium: Secondary | ICD-10-CM | POA: Diagnosis not present

## 2020-05-09 DIAGNOSIS — L851 Acquired keratosis [keratoderma] palmaris et plantaris: Secondary | ICD-10-CM | POA: Diagnosis not present

## 2020-05-09 DIAGNOSIS — M79674 Pain in right toe(s): Secondary | ICD-10-CM | POA: Diagnosis not present

## 2020-05-09 DIAGNOSIS — M79675 Pain in left toe(s): Secondary | ICD-10-CM | POA: Diagnosis not present

## 2020-05-09 DIAGNOSIS — I739 Peripheral vascular disease, unspecified: Secondary | ICD-10-CM | POA: Diagnosis not present

## 2020-05-25 ENCOUNTER — Other Ambulatory Visit: Payer: Self-pay | Admitting: Family Medicine

## 2020-05-25 DIAGNOSIS — F5101 Primary insomnia: Secondary | ICD-10-CM

## 2020-05-26 ENCOUNTER — Encounter: Payer: Self-pay | Admitting: Cardiology

## 2020-05-26 NOTE — Progress Notes (Signed)
  Cardiology Office Note  Date: 05/27/2020   ID: [name], DOB 03/17/1941, MRN [id number]  PCP:  Taylor, Malena M, DO  Cardiologist:  Samuel McDowell, MD Electrophysiologist:  None   Chief Complaint  Patient presents with  . Cardiac follow-up    History of Present Illness: [name] is an 79 y.o. male former patient of Dr. Koneswaran now presenting to establish follow-up with me.  I reviewed his records and updated the chart.  He was last seen in February 2020.  He is in the process of further work-up for AAA with increasing size, has CTA pending with follow-up with Dr. Dickson this month.  From a cardiac perspective he does not describe any obvious angina symptoms.  Up until about a month ago he was exercising at the [employer], mainly walking.  He has stopped due to increasing COVID-19 in the community.  He reports NYHA class II dyspnea with typical activities, no palpitations or syncope.  He does have claudication at about 100 yards.  I personally reviewed his ECG today which shows normal sinus rhythm with old inferior infarct pattern, possible old anterior infarct pattern, low voltage in the limb leads.  I reviewed his medications which are outlined below.  He has not undergone any ischemic testing since CABG in 2006.  Past Medical History:  Diagnosis Date  . Abdominal aortic aneurysm (HCC)   . Adenomatous polyps   . CAD (coronary artery disease)    CABG in 2006  . Carotid artery disease (HCC)   . Degenerative joint disease of spine    Lumbosacral and cervical; ant. discectomy, fusion and plate in 2011  . Diverticulosis   . ED (erectile dysfunction)   . Essential hypertension   . Hyperlipidemia   . Insomnia   . Neuropathy   . PAD (peripheral artery disease) (HCC)    Left femoral art. stent; bilateral renal art.stents - 2007  . Sinus headache   . Stroke (HCC)   . TIA (transient ischemic attack) 2008  . Type 2 diabetes mellitus (HCC)     Past Surgical  History:  Procedure Laterality Date  . ANTERIOR FUSION CERVICAL SPINE  2005   . COLONOSCOPY W/ POLYPECTOMY  2010  . CORONARY ARTERY BYPASS GRAFT  2006   LIMA to LAD; SVG to marginal; SVG to L posterolateral; nondoninant RCA   . LUMBAR SPINE SURGERY  1985    Discectomy and fusion  . MASS EXCISION Left 01/22/2014   Procedure: EXCISION LEFT WRIST MASS;  Surgeon: Christopher Y Blackman, MD;  Location: [facility];  Service: Orthopedics;  Laterality: Left;    Current Outpatient Medications  Medication Sig Dispense Refill  . ALPRAZolam (XANAX) 0.5 MG tablet TAKE 1 TABLET ONCE DAILY AT BEDTIME AS NEEDED FOR ANXIETY 30 tablet 1  . blood glucose meter kit and supplies KIT Dispense based on patient and insurance preference. Use up to four times daily to check your blood sugar (ICD-10 code E11.9) 1 each 0  . clopidogrel (PLAVIX) 75 MG tablet TAKE 1 TABLET BY MOUTH ONCE DAILY. 90 tablet 1  . lisinopril (ZESTRIL) 20 MG tablet TAKE (1) TABLET TWICE DAILY. 60 tablet 0  . Multiple Vitamin (MULTIVITAMIN) tablet Take 1 tablet by mouth daily.    . nortriptyline (PAMELOR) 25 MG capsule TAKE (1) CAPSULE DAILY AT BEDTIME. 30 capsule 0  . Omega-3 Fatty Acids (FISH OIL) 1000 MG CAPS Take 1 capsule by mouth daily.    . simvastatin (ZOCOR) 80 MG tablet TAKE 1   TABLET BY MOUTH ONCE DAILY. 30 tablet 1  . temazepam (RESTORIL) 30 MG capsule TAKE (1) CAPSULE DAILY AT BEDTIME. 30 capsule 1   No current facility-administered medications for this visit.   Allergies:  Atorvastatin   ROS: No syncope.  Physical Exam: VS:  BP 132/78   Pulse 76   Ht 5' 10" (1.778 m)   Wt 186 lb (84.4 kg)   SpO2 98%   BMI 26.69 kg/m , BMI Body mass index is 26.69 kg/m.  Wt Readings from Last 3 Encounters:  05/27/20 186 lb (84.4 kg)  05/04/20 179 lb (81.2 kg)  03/08/20 181 lb (82.1 kg)    General: Elderly male, appears comfortable at rest. HEENT: Conjunctiva and lids normal, wearing a mask. Neck: Supple, no elevated JVP or carotid  bruits, no thyromegaly. Lungs: Clear to auscultation, nonlabored breathing at rest. Cardiac: Regular rate and rhythm, no S3, soft systolic murmur, no pericardial rub. Abdomen: Soft, nontender, bowel sounds present. Extremities: No pitting edema.  ECG:  An ECG dated 07/10/2018 was personally reviewed today and demonstrated:  Sinus rhythm with prolonged PR interval, left anterior fascicular block versus old inferior infarct pattern, poor R wave progression rule out old anterior infarct pattern, nonspecific ST-T changes.  Recent Labwork: 12/03/2019: ALT 15; AST 15; Hemoglobin 16.9; Platelets 172 12/11/2019: BUN 23; Creatinine, Ser 1.06; Potassium 4.6; Sodium 140     Component Value Date/Time   CHOL 108 12/03/2019 1006   TRIG 73 12/03/2019 1006   HDL 42 12/03/2019 1006   CHOLHDL 2.6 12/03/2019 1006   CHOLHDL 3.3 02/08/2014 1133   VLDL 33 02/08/2014 1133   LDLCALC 51 12/03/2019 1006    Other Studies Reviewed Today:  ABIs 05/04/2020: Summary:  Right: Resting right ankle-brachial index is within normal range. No  evidence of significant right lower extremity arterial disease. The right  toe-brachial index is normal.   Left: Resting left ankle-brachial index indicates moderate left lower  extremity arterial disease. The left toe-brachial index is abnormal. LT  Great toe pressure = 27 mmHg.   Abdominal ultrasound 05/04/2020: Summary:  Abdominal Aorta: There is evidence of abnormal dilatation of the mid  Abdominal aorta. The largest aortic diameter has increased compared to  prior exam. Previous diameter measurement was 5.1 cm obtained on  03/11/2019.   Carotid Dopplers 02/24/2020: Summary:  Right Carotid: Velocities in the right ICA are consistent with a 60-79%         stenosis.   Left Carotid: Velocities in the left ICA are consistent with a 1-39%  stenosis.   Vertebrals: Bilateral vertebral arteries demonstrate antegrade flow.  Subclavians: Normal flow hemodynamics  were seen in bilateral subclavian        arteries.   Assessment and Plan:  1. Multivessel CAD status post CABG in 2006.  He does not describe any recurring angina symptoms at this time, NYHA class II dyspnea.  Currently on Plavix, lisinopril, and Zocor.  2. Preoperative cardiac assessment prior to anticipated AAA repair, uncertain if open operation or endovascular repair.  CTA pending with follow-up per Dr. Dickson.  RCRI cardiac risk calculation is class IV, 11% chance of major adverse cardiac event.  We will obtain a Lexiscan Myoview to ensure no high ischemic burden or other high risk features.  3. Mixed hyperlipidemia, on Zocor.  Last LDL 51.   Medication Adjustments/Labs and Tests Ordered: Current medicines are reviewed at length with the patient today.  Concerns regarding medicines are outlined above.   Tests Ordered: Orders Placed This   Encounter  Procedures  . NM Myocar Multi W/Spect W/Wall Motion / EF  . EKG 12-Lead    Medication Changes: No orders of the defined types were placed in this encounter.   Disposition:  Follow up 6 months in the [place] office.  Signed, Samuel G. McDowell, MD, FACC 05/27/2020 9:50 AM    [facility] [place] [address], [place], [place] [address] Phone: [phone]; Fax: [phone] 

## 2020-05-27 ENCOUNTER — Encounter: Payer: Self-pay | Admitting: *Deleted

## 2020-05-27 ENCOUNTER — Ambulatory Visit (INDEPENDENT_AMBULATORY_CARE_PROVIDER_SITE_OTHER): Payer: Medicare Other | Admitting: Cardiology

## 2020-05-27 ENCOUNTER — Encounter: Payer: Self-pay | Admitting: Cardiology

## 2020-05-27 VITALS — BP 132/78 | HR 76 | Ht 70.0 in | Wt 186.0 lb

## 2020-05-27 DIAGNOSIS — I25119 Atherosclerotic heart disease of native coronary artery with unspecified angina pectoris: Secondary | ICD-10-CM

## 2020-05-27 DIAGNOSIS — E782 Mixed hyperlipidemia: Secondary | ICD-10-CM

## 2020-05-27 DIAGNOSIS — Z01810 Encounter for preprocedural cardiovascular examination: Secondary | ICD-10-CM

## 2020-05-27 NOTE — Patient Instructions (Signed)
Medication Instructions:  Continue all current medications.  Labwork: none  Testing/Procedures:  Your physician has requested that you have a lexiscan myoview. For further information please visit [email or web address]. Please follow instruction sheet, as given.  Office will contact with results via phone or letter.    Follow-Up: 6 months   Any Other Special Instructions Will Be Listed Below (If Applicable).  If you need a refill on your cardiac medications before your next appointment, please call your pharmacy.

## 2020-05-30 ENCOUNTER — Ambulatory Visit (HOSPITAL_COMMUNITY)
Admission: RE | Admit: 2020-05-30 | Discharge: 2020-05-30 | Disposition: A | Discharge status: Home / Self Care | Payer: Medicare Other | Source: Ambulatory Visit | Attending: Vascular Surgery | Admitting: Vascular Surgery

## 2020-05-30 ENCOUNTER — Other Ambulatory Visit: Payer: Self-pay

## 2020-05-30 DIAGNOSIS — I714 Abdominal aortic aneurysm, without rupture, unspecified: Secondary | ICD-10-CM

## 2020-05-30 DIAGNOSIS — I701 Atherosclerosis of renal artery: Secondary | ICD-10-CM | POA: Diagnosis not present

## 2020-05-30 DIAGNOSIS — I739 Peripheral vascular disease, unspecified: Secondary | ICD-10-CM | POA: Diagnosis not present

## 2020-05-30 DIAGNOSIS — I723 Aneurysm of iliac artery: Secondary | ICD-10-CM | POA: Diagnosis not present

## 2020-05-30 DIAGNOSIS — I712 Thoracic aortic aneurysm, without rupture: Secondary | ICD-10-CM | POA: Diagnosis not present

## 2020-05-30 LAB — POCT I-STAT CREATININE: Creatinine, Ser: 1.1 mg/dL (ref 0.61–1.24)

## 2020-05-30 MED ORDER — IOHEXOL 350 MG/ML SOLN
100.0000 mL | Freq: Once | INTRAVENOUS | Status: AC | PRN
  Administered 2020-05-30: 100 mL via INTRAVENOUS

## 2020-06-01 ENCOUNTER — Encounter: Payer: Self-pay | Admitting: Vascular Surgery

## 2020-06-01 ENCOUNTER — Other Ambulatory Visit: Payer: Self-pay

## 2020-06-01 ENCOUNTER — Ambulatory Visit (INDEPENDENT_AMBULATORY_CARE_PROVIDER_SITE_OTHER): Payer: Medicare Other | Admitting: Vascular Surgery

## 2020-06-01 VITALS — BP 144/88 | HR 77 | Temp 98.2°F | Resp 20 | Ht 70.0 in | Wt 180.7 lb

## 2020-06-01 DIAGNOSIS — I25119 Atherosclerotic heart disease of native coronary artery with unspecified angina pectoris: Secondary | ICD-10-CM | POA: Diagnosis not present

## 2020-06-01 DIAGNOSIS — I714 Abdominal aortic aneurysm, without rupture, unspecified: Secondary | ICD-10-CM

## 2020-06-01 NOTE — Progress Notes (Signed)
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  I explained that in a normal risk patient we would consider elective repair at 5.5 cm.  He is clearly at increased risk given that this is a juxtarenal aneurysm, he has significant coronary artery disease, and given his age.  We will use the Society vascular surgery risk calculator for abdominal aortic aneurysm his risk of perioperative mortality is 15 to 20%.  I explained that given that the aneurysm is 5.5 cm his risk of rupture is approximately 7 %/year.  I have asked Dr. Trula Slade to review his films to determine if he might be a candidate for a fenestrated graft is clearly he is not a candidate for a standard endovascular repair given that the aneurysm extends up to the renals.  If he is not a candidate for his fenestrated graft I explained that we might consider a 25-monthfollow-up study and only consider elective open repair of his aneurysm if this enlarges significantly given that open repair would be associated with significant risk of perioperative morbidity or mortality.  Fortunately he is not a smoker.  He is on Plavix and a statin.  He is scheduled for a Myoview tomorrow.  Of note cardiology saw him and felt his risk of major cardiac event would be 11% although they were not aware that he would require a suprarenal clamp and open repair.  I will call the patient once Dr. BTrula Sladehas had a chance to review his films for a possible FEVAR and after results of his cardiac work-up.  PERIPHERAL VASCULAR DISEASE: He does have peripheral vascular disease.  On the right side he has a monophasic dorsalis pedis and posterior tibial signal with an ABI of 96% which is likely falsely elevated.  On the left side he has a monophasic peroneal and dorsalis pedis signal.  The posterior tibial signal cannot be obtained ABI 64% on  the left.  He has stable claudication.  HPI:   Zachary STEEGEis a pleasant 80y.o. male who I last saw on 05/04/2020 with a 6 cm abdominal aortic aneurysm by ultrasound.  Based on a CT scan from 2019 the neck was fairly short and I was concerned that he would not be a candidate for endovascular repair.  Therefore I ordered a CT angiogram with thin cuts to further assess his aneurysm.  I felt that if he required open repair he would be at high risk given his bilateral renal artery stents the need for a suprarenal clamp, his age, COPD, mild chronic kidney disease, and coronary artery disease.  Since I saw the patient last he denies any significant abdominal pain or back pain.  He has had some mild left flank pain and has had a previous kidney stone.  If this persist I think he should follow-up with his primary care doctor for a urinalysis.  His CT scan did not show any evidence of periaortic inflammation.  This was just done on the 17th.  Past Medical History:  Diagnosis Date  . Abdominal aortic aneurysm ([facility]   . Adenomatous polyps   . CAD (coronary artery disease)    CABG in 2006  . Carotid artery disease ([place]   . Degenerative joint disease of spine    Lumbosacral and cervical; ant. discectomy, fusion and plate in 20998 . Diverticulosis   . ED (erectile  dysfunction)   . Essential hypertension   . Hyperlipidemia   . Insomnia   . Neuropathy   . PAD (peripheral artery disease) ([place])    Left femoral art. stent; bilateral renal art.stents - 2007  . Sinus headache   . Stroke ([place])   . TIA (transient ischemic attack) 2008  . Type 2 diabetes mellitus (HCC)     Family History  Problem Relation Age of Onset  . Stroke Father   . Pulmonary embolism Mother        Following hip fracture  . Colon cancer Brother   . Hypertension Brother     SOCIAL HISTORY: Social History   Tobacco Use  . Smoking status: Former Smoker    Packs/day: 1.00    Years: 50.00    Pack years: 50.00    Types:  Cigarettes    Start date: 05/10/1952    Quit date: 10/18/2003    Years since quitting: 16.6  . Smokeless tobacco: Never Used  . Tobacco comment: 50 pack years; quit in 2005   Substance Use Topics  . Alcohol use: No    Alcohol/week: 0.0 standard drinks    Allergies  Allergen Reactions  . Atorvastatin Other (See Comments)    Side effects-muscle aches Other reaction(s): Other (See Comments) Side effects-muscle aches Side effects-muscle aches    Current Outpatient Medications  Medication Sig Dispense Refill  . ALPRAZolam (XANAX) 0.5 MG tablet TAKE 1 TABLET ONCE DAILY AT BEDTIME AS NEEDED FOR ANXIETY 30 tablet 1  . blood glucose meter kit and supplies KIT Dispense based on patient and insurance preference. Use up to four times daily to check your blood sugar (ICD-10 code E11.9) 1 each 0  . clopidogrel (PLAVIX) 75 MG tablet TAKE 1 TABLET BY MOUTH ONCE DAILY. 90 tablet 1  . lisinopril (ZESTRIL) 20 MG tablet TAKE (1) TABLET TWICE DAILY. 60 tablet 0  . Multiple Vitamin (MULTIVITAMIN) tablet Take 1 tablet by mouth daily.    . nortriptyline (PAMELOR) 25 MG capsule TAKE (1) CAPSULE DAILY AT BEDTIME. 30 capsule 0  . Omega-3 Fatty Acids (FISH OIL) 1000 MG CAPS Take 1 capsule by mouth daily.    . simvastatin (ZOCOR) 80 MG tablet TAKE 1 TABLET BY MOUTH ONCE DAILY. 30 tablet 1  . temazepam (RESTORIL) 30 MG capsule TAKE (1) CAPSULE DAILY AT BEDTIME. 30 capsule 1   No current facility-administered medications for this visit.    REVIEW OF SYSTEMS:  [X]  denotes positive finding, [ ]  denotes negative finding Cardiac  Comments:  Chest pain or chest pressure:    Shortness of breath upon exertion:    Short of breath when lying flat:    Irregular heart rhythm:        Vascular    Pain in calf, thigh, or hip brought on by ambulation:    Pain in feet at night that wakes you up from your sleep:     Blood clot in your veins:    Leg swelling:         Pulmonary    Oxygen at home:    Productive cough:      Wheezing:         Neurologic    Sudden weakness in arms or legs:     Sudden numbness in arms or legs:     Sudden onset of difficulty speaking or slurred speech:    Temporary loss of vision in one eye:     Problems with dizziness:  Gastrointestinal    Blood in stool:     Vomited blood:         Genitourinary    Burning when urinating:     Blood in urine:        Psychiatric    Major depression:         Hematologic    Bleeding problems:    Problems with blood clotting too easily:        Skin    Rashes or ulcers:        Constitutional    Fever or chills:     PHYSICAL EXAM:   Vitals:   06/01/20 1253  BP: (!) 144/88  Pulse: 77  Resp: 20  Temp: 98.2 F (36.8 C)  SpO2: 96%  Weight: 180 lb 11.2 oz (82 kg)  Height: 5' 10"  (1.778 m)    GENERAL: The patient is a well-nourished male, in no acute distress. The vital signs are documented above. CARDIAC: There is a regular rate and rhythm.  VASCULAR: I do not detect carotid bruits. He has a slightly diminished right femoral pulse.  Has a normal left femoral pulse.  Both feet are warm and well-perfused. PULMONARY: There is good air exchange bilaterally without wheezing or rales. ABDOMEN: Soft and non-tender with normal pitched bowel sounds.  His aneurysm is palpable and nontender. MUSCULOSKELETAL: There are no major deformities or cyanosis. NEUROLOGIC: No focal weakness or paresthesias are detected. SKIN: There are no ulcers or rashes noted. PSYCHIATRIC: The patient has a normal affect.  DATA:    CT ANGIO: I have reviewed the images of his CT angiogram of the abdomen and pelvis.  The aneurysm measures 5.5 cm in maximum diameter.  The aneurysm is clearly juxtarenal and extends up to the level of the renal artery stents.  Open repair would require placement of a suprarenal clamp.  He does have significant calcific disease and atherosclerosis in his iliac arteries bilaterally.  Deitra [facility] [phone]

## 2020-06-02 ENCOUNTER — Encounter (HOSPITAL_COMMUNITY)
Admission: RE | Admit: 2020-06-02 | Discharge: 2020-06-02 | Disposition: A | Discharge status: Home / Self Care | Payer: Medicare Other | Source: Ambulatory Visit | Attending: Cardiology | Admitting: Cardiology

## 2020-06-02 ENCOUNTER — Ambulatory Visit (HOSPITAL_COMMUNITY)
Admission: RE | Admit: 2020-06-02 | Discharge: 2020-06-02 | Disposition: A | Discharge status: Home / Self Care | Payer: Medicare Other | Source: Ambulatory Visit | Attending: Cardiology | Admitting: Cardiology

## 2020-06-02 DIAGNOSIS — I25119 Atherosclerotic heart disease of native coronary artery with unspecified angina pectoris: Secondary | ICD-10-CM | POA: Diagnosis not present

## 2020-06-02 DIAGNOSIS — Z01810 Encounter for preprocedural cardiovascular examination: Secondary | ICD-10-CM | POA: Diagnosis not present

## 2020-06-02 LAB — NM MYOCAR MULTI W/SPECT W/WALL MOTION / EF
LV dias vol: 90 mL (ref 62–150)
LV sys vol: 33 mL
Peak HR: 100 {beats}/min
RATE: 0.33
Rest HR: 71 {beats}/min
SDS: 2
SRS: 6
SSS: 8
TID: 1.05

## 2020-06-02 MED ORDER — TECHNETIUM TC 99M TETROFOSMIN IV KIT
10.0000 | PACK | Freq: Once | INTRAVENOUS | Status: AC | PRN
  Administered 2020-06-02: 10.5 via INTRAVENOUS

## 2020-06-02 MED ORDER — TECHNETIUM TC 99M TETROFOSMIN IV KIT
30.0000 | PACK | Freq: Once | INTRAVENOUS | Status: AC | PRN
  Administered 2020-06-02: 29 via INTRAVENOUS

## 2020-06-02 MED ORDER — SODIUM CHLORIDE FLUSH 0.9 % IV SOLN
INTRAVENOUS | Status: AC
  Administered 2020-06-02: 10 mL via INTRAVENOUS
  Filled 2020-06-02: qty 10

## 2020-06-02 MED ORDER — REGADENOSON 0.4 MG/5ML IV SOLN
INTRAVENOUS | Status: AC
  Administered 2020-06-02: 0.4 mg via INTRAVENOUS
  Filled 2020-06-02: qty 5

## 2020-06-03 ENCOUNTER — Telehealth: Payer: Self-pay | Admitting: *Deleted

## 2020-06-03 NOTE — Telephone Encounter (Signed)
Patient informed. Copy sent to PCP °

## 2020-06-03 NOTE — Telephone Encounter (Signed)
-----   Message from Satira Sark, MD sent at 06/02/2020  4:26 PM EST ----- Results reviewed.  Myoview was low risk, mild ischemic territory noted and LVEF normal.  He was seen recently for preoperative cardiac assessment prior to possible AAA repair.  I reviewed the interval note from Dr. Scot Dock.  Baseline RCRI cardiac risk calculation is high risk, class IV, 11% chance of major adverse cardiac event.  Stress testing does not necessarily change this risk and would also suggest that a cardiac catheterization is not necessary in the absence of progressive angina, and it would not be expected that revascularization would necessarily reduce his perioperative risk.  He still remains high risk for traditional AAA open repair.

## 2020-07-11 ENCOUNTER — Other Ambulatory Visit: Payer: Self-pay

## 2020-07-11 ENCOUNTER — Encounter: Payer: Self-pay | Admitting: Family Medicine

## 2020-07-11 ENCOUNTER — Ambulatory Visit (INDEPENDENT_AMBULATORY_CARE_PROVIDER_SITE_OTHER): Payer: Medicare Other | Admitting: Family Medicine

## 2020-07-11 VITALS — BP 145/88 | HR 75 | Temp 97.4°F | Ht 70.0 in | Wt 184.0 lb

## 2020-07-11 DIAGNOSIS — I10 Essential (primary) hypertension: Secondary | ICD-10-CM | POA: Diagnosis not present

## 2020-07-11 DIAGNOSIS — Z794 Long term (current) use of insulin: Secondary | ICD-10-CM

## 2020-07-11 DIAGNOSIS — E118 Type 2 diabetes mellitus with unspecified complications: Secondary | ICD-10-CM | POA: Diagnosis not present

## 2020-07-11 DIAGNOSIS — F5101 Primary insomnia: Secondary | ICD-10-CM

## 2020-07-11 MED ORDER — ALPRAZOLAM 0.5 MG PO TABS
ORAL_TABLET | ORAL | 3 refills | Status: DC
Start: 2020-07-11 — End: 2020-09-25

## 2020-07-11 MED ORDER — TEMAZEPAM 30 MG PO CAPS: ORAL_CAPSULE | ORAL | 3 refills | Status: DC

## 2020-07-11 MED ORDER — LISINOPRIL 20 MG PO TABS: 20.0000 mg | ORAL_TABLET | Freq: Two times a day (BID) | ORAL | 1 refills | Status: DC

## 2020-07-11 MED ORDER — SIMVASTATIN 80 MG PO TABS: 80.0000 mg | ORAL_TABLET | Freq: Every day | ORAL | 1 refills | Status: DC

## 2020-07-11 MED ORDER — CLOPIDOGREL BISULFATE 75 MG PO TABS: 75.0000 mg | ORAL_TABLET | Freq: Every day | ORAL | 1 refills | Status: DC

## 2020-07-11 MED ORDER — NORTRIPTYLINE HCL 25 MG PO CAPS: ORAL_CAPSULE | ORAL | 1 refills | Status: DC

## 2020-07-11 NOTE — Progress Notes (Signed)
Patient ID: [name], male    DOB: May 08, 1941, 80 y.o.   MRN: [id number]   Chief Complaint  Patient presents with  . Insomnia   Subjective:    HPI  Pt here for 4 month follow up. Pt taking all meds as prescribed.   H/o AAA, seeing cardiology and vascular surgeon. 5.5cm aorta and they are watching.  Was going to do surgery, however pt is high risk.  Now watching it. Seeing Dr. Scot Dock, vascular. Did stress testing also. Pt is high risk for open AAA repair.  Insomnia- No issues with meds.  Sleep 4-5 hrs per night.  No falls.  No napping during the day.  Pt wanting continue on same meds.  Medical History Colman has a past medical history of Abdominal aortic aneurysm ([place]), Adenomatous polyps, CAD (coronary artery disease), Carotid artery disease ([place]), Degenerative joint disease of spine, Diverticulosis, ED (erectile dysfunction), Essential hypertension, Hyperlipidemia, Insomnia, Neuropathy, PAD (peripheral artery disease) ([place]), Sinus headache, Stroke ([place]), TIA (transient ischemic attack) (2008), and Type 2 diabetes mellitus ([place]).   Outpatient Encounter Medications as of 07/11/2020  Medication Sig  . blood glucose meter kit and supplies KIT Dispense based on patient and insurance preference. Use up to four times daily to check your blood sugar (ICD-10 code E11.9)  . Multiple Vitamin (MULTIVITAMIN) tablet Take 1 tablet by mouth daily.  . Omega-3 Fatty Acids (FISH OIL) 1000 MG CAPS Take 1 capsule by mouth daily.  . [DISCONTINUED] ALPRAZolam (XANAX) 0.5 MG tablet TAKE 1 TABLET ONCE DAILY AT BEDTIME AS NEEDED FOR ANXIETY  . [DISCONTINUED] clopidogrel (PLAVIX) 75 MG tablet TAKE 1 TABLET BY MOUTH ONCE DAILY.  . [DISCONTINUED] lisinopril (ZESTRIL) 20 MG tablet TAKE (1) TABLET TWICE DAILY.  . [DISCONTINUED] nortriptyline (PAMELOR) 25 MG capsule TAKE (1) CAPSULE DAILY AT BEDTIME.  . [DISCONTINUED] simvastatin (ZOCOR) 80 MG tablet TAKE 1 TABLET BY MOUTH ONCE DAILY.  .  [DISCONTINUED] temazepam (RESTORIL) 30 MG capsule TAKE (1) CAPSULE DAILY AT BEDTIME.  Marland Kitchen ALPRAZolam (XANAX) 0.5 MG tablet TAKE 1 TABLET ONCE DAILY AT BEDTIME AS NEEDED FOR ANXIETY  . clopidogrel (PLAVIX) 75 MG tablet Take 1 tablet (75 mg total) by mouth daily.  Marland Kitchen lisinopril (ZESTRIL) 20 MG tablet Take 1 tablet (20 mg total) by mouth 2 (two) times daily.  . nortriptyline (PAMELOR) 25 MG capsule TAKE (1) CAPSULE DAILY AT BEDTIME.  . simvastatin (ZOCOR) 80 MG tablet Take 1 tablet (80 mg total) by mouth daily.  . temazepam (RESTORIL) 30 MG capsule TAKE (1) CAPSULE DAILY AT BEDTIME.   No facility-administered encounter medications on file as of 07/11/2020.     Review of Systems  Constitutional: Negative for chills and fever.  HENT: Negative for congestion, rhinorrhea and sore throat.   Respiratory: Negative for cough, shortness of breath and wheezing.   Cardiovascular: Negative for chest pain and leg swelling.  Gastrointestinal: Negative for abdominal pain, diarrhea, nausea and vomiting.  Genitourinary: Negative for dysuria and frequency.  Skin: Negative for rash.  Neurological: Negative for dizziness, weakness and headaches.  Psychiatric/Behavioral: Negative for dysphoric mood, self-injury, sleep disturbance and suicidal ideas. The patient is not nervous/anxious.      Vitals BP (!) 145/88   Pulse 75   Temp (!) 97.4 F (36.3 C)   Ht 5' 10" (1.778 m)   Wt 184 lb (83.5 kg)   SpO2 98%   BMI 26.40 kg/m   Objective:   Physical Exam Vitals and nursing note reviewed.  Constitutional:  General: He is not in acute distress.    Appearance: Normal appearance. He is not ill-appearing.  HENT:     Head: Normocephalic.     Nose: Nose normal. No congestion.     Mouth/Throat:     Mouth: Mucous membranes are moist.     Pharynx: No oropharyngeal exudate.  Eyes:     Extraocular Movements: Extraocular movements intact.     Conjunctiva/sclera: Conjunctivae normal.     Pupils: Pupils are  equal, round, and reactive to light.  Cardiovascular:     Rate and Rhythm: Normal rate and regular rhythm.     Pulses: Normal pulses.     Heart sounds: Normal heart sounds. No murmur heard.   Pulmonary:     Effort: Pulmonary effort is normal.     Breath sounds: Normal breath sounds. No wheezing, rhonchi or rales.  Musculoskeletal:        General: Normal range of motion.     Right lower leg: No edema.     Left lower leg: No edema.  Skin:    General: Skin is warm and dry.     Findings: No rash.  Neurological:     General: No focal deficit present.     Mental Status: He is alert and oriented to person, place, and time.     Cranial Nerves: No cranial nerve deficit.  Psychiatric:        Mood and Affect: Mood normal.        Behavior: Behavior normal.        Thought Content: Thought content normal.        Judgment: Judgment normal.      Assessment and Plan   1. Controlled type 2 diabetes mellitus with complication, with long-term current use of insulin (HCC) - CMP14+EGFR - Lipid panel - Hemoglobin A1c - Microalbumin, urine  2. Primary hypertension - CBC - Lipid panel  3. Primary insomnia - ALPRAZolam (XANAX) 0.5 MG tablet; TAKE 1 TABLET ONCE DAILY AT BEDTIME AS NEEDED FOR ANXIETY  Dispense: 30 tablet; Refill: 3 - temazepam (RESTORIL) 30 MG capsule; TAKE (1) CAPSULE DAILY AT BEDTIME.  Dispense: 30 capsule; Refill: 3   DM2- diet controlled.  Ordered labs.   htn- suboptimal. Taking lisinopril 20mg bid cont to watch salt intake.  Recheck next visit. -cont meds.   Insomnia- stable. Improved with meds.  Pt wanting to continue. Discussed risk vs benefit of staying on temazepam and xanax long term. Pt voiced understanding.  Labs ordered today. To get in next week. Last fill temazepam and xanax 06/24/20. Stable and doing well.  wanting to cont with these medications.  Helping with his sleep.   Return in about 4 months (around 11/08/2020) for f/u insomnia, htn, hld.  BP  Readings from Last 3 Encounters:  07/11/20 (!) 145/88  06/01/20 (!) 144/88  05/27/20 132/78    

## 2020-07-12 DIAGNOSIS — Z794 Long term (current) use of insulin: Secondary | ICD-10-CM | POA: Diagnosis not present

## 2020-07-12 DIAGNOSIS — I10 Essential (primary) hypertension: Secondary | ICD-10-CM | POA: Diagnosis not present

## 2020-07-12 DIAGNOSIS — E118 Type 2 diabetes mellitus with unspecified complications: Secondary | ICD-10-CM | POA: Diagnosis not present

## 2020-07-13 LAB — LIPID PANEL
Chol/HDL Ratio: 2.9 ratio (ref 0.0–5.0)
Cholesterol, Total: 110 mg/dL (ref 100–199)
HDL: 38 mg/dL — ABNORMAL LOW (ref >39)
LDL Chol Calc (NIH): 55 mg/dL (ref 0–99)
Triglycerides: 88 mg/dL (ref 0–149)
VLDL Cholesterol Cal: 17 mg/dL (ref 5–40)

## 2020-07-13 LAB — CMP14+EGFR
ALT: 17 IU/L (ref 0–44)
AST: 16 IU/L (ref 0–40)
Albumin/Globulin Ratio: 1.8 (ref 1.2–2.2)
Albumin: 4.2 g/dL (ref 3.6–4.6)
Alkaline Phosphatase: 91 IU/L (ref 44–121)
BUN/Creatinine Ratio: 19 (ref 10–24)
BUN: 22 mg/dL (ref 8–27)
Bilirubin Total: 0.6 mg/dL (ref 0.0–1.2)
CO2: 22 mmol/L (ref 20–29)
Calcium: 9.4 mg/dL (ref 8.6–10.2)
Chloride: 103 mmol/L (ref 96–106)
Creatinine, Ser: 1.15 mg/dL (ref 0.76–1.27)
Globulin, Total: 2.4 g/dL (ref 1.5–4.5)
Glucose: 110 mg/dL — ABNORMAL HIGH (ref 65–99)
Potassium: 5.3 mmol/L — ABNORMAL HIGH (ref 3.5–5.2)
Sodium: 139 mmol/L (ref 134–144)
Total Protein: 6.6 g/dL (ref 6.0–8.5)
eGFR: 64 mL/min/{1.73_m2} (ref >59)

## 2020-07-13 LAB — HEMOGLOBIN A1C
Est. average glucose Bld gHb Est-mCnc: 120 mg/dL
Hgb A1c MFr Bld: 5.8 % — ABNORMAL HIGH (ref 4.8–5.6)

## 2020-07-13 LAB — CBC
Hematocrit: 47.2 % (ref 37.5–51.0)
Hemoglobin: 16.5 g/dL (ref 13.0–17.7)
MCH: 32.8 pg (ref 26.6–33.0)
MCHC: 35 g/dL (ref 31.5–35.7)
MCV: 94 fL (ref 79–97)
Platelets: 164 10*3/uL (ref 150–450)
RBC: 5.03 x10E6/uL (ref 4.14–5.80)
RDW: 11.8 % (ref 11.6–15.4)
WBC: 7.2 10*3/uL (ref 3.4–10.8)

## 2020-07-13 LAB — MICROALBUMIN, URINE: Microalbumin, Urine: 307.3 ug/mL

## 2020-07-14 ENCOUNTER — Telehealth: Payer: Self-pay

## 2020-07-14 NOTE — Telephone Encounter (Signed)
Pt returned Abigail Butts phone call   Pt call back [phone]

## 2020-07-14 NOTE — Telephone Encounter (Signed)
Pt contacted. Reviewed result note with patient.

## 2020-07-18 DIAGNOSIS — M79675 Pain in left toe(s): Secondary | ICD-10-CM | POA: Diagnosis not present

## 2020-07-18 DIAGNOSIS — B351 Tinea unguium: Secondary | ICD-10-CM | POA: Diagnosis not present

## 2020-07-18 DIAGNOSIS — I739 Peripheral vascular disease, unspecified: Secondary | ICD-10-CM | POA: Diagnosis not present

## 2020-07-18 DIAGNOSIS — M79674 Pain in right toe(s): Secondary | ICD-10-CM | POA: Diagnosis not present

## 2020-07-18 DIAGNOSIS — L851 Acquired keratosis [keratoderma] palmaris et plantaris: Secondary | ICD-10-CM | POA: Diagnosis not present

## 2020-07-20 ENCOUNTER — Encounter: Payer: Self-pay | Admitting: Family Medicine

## 2020-07-24 ENCOUNTER — Other Ambulatory Visit: Payer: Self-pay | Admitting: Family Medicine

## 2020-08-23 ENCOUNTER — Telehealth: Payer: Self-pay | Admitting: Family Medicine

## 2020-08-23 ENCOUNTER — Other Ambulatory Visit: Payer: Self-pay | Admitting: Family Medicine

## 2020-08-23 DIAGNOSIS — F5101 Primary insomnia: Secondary | ICD-10-CM

## 2020-08-23 NOTE — Telephone Encounter (Signed)
Pt needs appt in early June to follow up on sleep medication. Please contact patient and have him schedule appt. Thank you.

## 2020-08-25 ENCOUNTER — Other Ambulatory Visit: Payer: Self-pay | Admitting: Family Medicine

## 2020-08-25 DIAGNOSIS — F5101 Primary insomnia: Secondary | ICD-10-CM

## 2020-09-12 NOTE — Telephone Encounter (Signed)
Called left message for to schedule appointment

## 2020-09-20 DIAGNOSIS — Z961 Presence of intraocular lens: Secondary | ICD-10-CM | POA: Diagnosis not present

## 2020-09-20 DIAGNOSIS — H26492 Other secondary cataract, left eye: Secondary | ICD-10-CM | POA: Diagnosis not present

## 2020-09-20 DIAGNOSIS — E119 Type 2 diabetes mellitus without complications: Secondary | ICD-10-CM | POA: Diagnosis not present

## 2020-09-23 ENCOUNTER — Other Ambulatory Visit: Payer: Self-pay | Admitting: Family Medicine

## 2020-09-23 DIAGNOSIS — F5101 Primary insomnia: Secondary | ICD-10-CM

## 2020-09-23 NOTE — Telephone Encounter (Signed)
Last seen 07/11/20

## 2020-09-26 DIAGNOSIS — M79674 Pain in right toe(s): Secondary | ICD-10-CM | POA: Diagnosis not present

## 2020-09-26 DIAGNOSIS — B351 Tinea unguium: Secondary | ICD-10-CM | POA: Diagnosis not present

## 2020-09-26 DIAGNOSIS — I739 Peripheral vascular disease, unspecified: Secondary | ICD-10-CM | POA: Diagnosis not present

## 2020-09-26 DIAGNOSIS — M79675 Pain in left toe(s): Secondary | ICD-10-CM | POA: Diagnosis not present

## 2020-09-26 DIAGNOSIS — L851 Acquired keratosis [keratoderma] palmaris et plantaris: Secondary | ICD-10-CM | POA: Diagnosis not present

## 2020-09-28 DIAGNOSIS — H26492 Other secondary cataract, left eye: Secondary | ICD-10-CM | POA: Diagnosis not present

## 2020-09-28 DIAGNOSIS — Z961 Presence of intraocular lens: Secondary | ICD-10-CM | POA: Diagnosis not present

## 2020-10-03 ENCOUNTER — Telehealth: Payer: Self-pay | Admitting: Family Medicine

## 2020-10-03 NOTE — Telephone Encounter (Signed)
Last labs 07/12/20 lipid, cbc, cmp, a1c, urine acr

## 2020-10-03 NOTE — Telephone Encounter (Signed)
Patient has appointment on 6/8 for medication follow up ,will he need labs done.

## 2020-10-03 NOTE — Telephone Encounter (Signed)
Pls order bmp and a1c. Thx. Dr. Darene Lamer

## 2020-10-04 ENCOUNTER — Other Ambulatory Visit: Payer: Self-pay | Admitting: *Deleted

## 2020-10-04 DIAGNOSIS — E118 Type 2 diabetes mellitus with unspecified complications: Secondary | ICD-10-CM

## 2020-10-04 NOTE — Telephone Encounter (Signed)
Bw orders put in and left message to return call

## 2020-10-05 NOTE — Progress Notes (Signed)
Cardiology Office Note  Date: 10/06/2020   ID: Zachary, Huynh 29-Dec-1940, MRN [id number]  PCP:  Erven Colla, DO  Cardiologist:  Rozann Lesches, MD Electrophysiologist:  None   Chief Complaint  Patient presents with  . Cardiac follow-up    History of Present Illness: [name] is an 80 y.o. male last seen in January.  He presents for a follow-up visit.  Reports no angina at this point on medical therapy.  He has not been exercising as regularly at the [facility] and has gained weight.  He plans to start back soon.  Follow-up Myoview in January was overall low risk, LVEF 63%, and evidence of scar with mild peri-infarct ischemia in the basal inferoseptal/inferolateral walls.  Current cardiac regimen includes Plavix, lisinopril, and Zocor.  He anticipates follow-up with Dr. Scot Dock in June for review of options and management of asymptomatic AAA.  Last measurement was 5.5 cm.  Past Medical History:  Diagnosis Date  . Abdominal aortic aneurysm ([facility])   . Adenomatous polyps   . CAD (coronary artery disease)    CABG in 2006  . Carotid artery disease ([place])   . Degenerative joint disease of spine    Lumbosacral and cervical; ant. discectomy, fusion and plate in 3662  . Diverticulosis   . ED (erectile dysfunction)   . Essential hypertension   . Hyperlipidemia   . Insomnia   . Neuropathy   . PAD (peripheral artery disease) ([place])    Left femoral art. stent; bilateral renal art.stents - 2007  . Sinus headache   . Stroke ([place])   . TIA (transient ischemic attack) 2008  . Type 2 diabetes mellitus ([place])     Past Surgical History:  Procedure Laterality Date  . ANTERIOR FUSION CERVICAL SPINE  2005   . COLONOSCOPY W/ POLYPECTOMY  2010  . CORONARY ARTERY BYPASS GRAFT  2006   LIMA to LAD; SVG to marginal; SVG to L posterolateral; nondoninant RCA   . LUMBAR SPINE SURGERY  1985    Discectomy and fusion  . MASS EXCISION Left 01/22/2014   Procedure: EXCISION LEFT WRIST  MASS;  Surgeon: Mcarthur Rossetti, MD;  Location: [facility];  Service: Orthopedics;  Laterality: Left;    Current Outpatient Medications  Medication Sig Dispense Refill  . ALPRAZolam (XANAX) 0.5 MG tablet TAKE 1 TABLET ONCE DAILY AT BEDTIME AS NEEDED FOR ANXIETY 30 tablet 0  . blood glucose meter kit and supplies KIT Dispense based on patient and insurance preference. Use up to four times daily to check your blood sugar (ICD-10 code E11.9) 1 each 0  . clopidogrel (PLAVIX) 75 MG tablet TAKE 1 TABLET BY MOUTH ONCE DAILY. 30 tablet 0  . lisinopril (ZESTRIL) 20 MG tablet Take 1 tablet (20 mg total) by mouth 2 (two) times daily. 180 tablet 1  . Multiple Vitamin (MULTIVITAMIN) tablet Take 1 tablet by mouth daily.    . nortriptyline (PAMELOR) 25 MG capsule TAKE (1) CAPSULE DAILY AT BEDTIME. 90 capsule 1  . Omega-3 Fatty Acids (FISH OIL) 1000 MG CAPS Take 1 capsule by mouth daily.    . simvastatin (ZOCOR) 80 MG tablet Take 1 tablet (80 mg total) by mouth daily. 90 tablet 1  . temazepam (RESTORIL) 30 MG capsule TAKE (1) CAPSULE DAILY AT BEDTIME. 30 capsule 0   No current facility-administered medications for this visit.   Allergies:  Atorvastatin   ROS: No palpitations or syncope.  Physical Exam: VS:  BP 136/78   Pulse  73   Ht _0  (1.778 m)   Wt 191 lb 9.6 oz (86.9 kg)   SpO2 95%   BMI 27.49 kg/m , BMI Body mass index is 27.49 kg/m.  Wt Readings from Last 3 Encounters:  10/06/20 191 lb 9.6 oz (86.9 kg)  07/11/20 184 lb (83.5 kg)  06/01/20 180 lb 11.2 oz (82 kg)    General: Elderly male, appears comfortable at rest. HEENT: Conjunctiva and lids normal, wearing a mask. Neck: Supple, no elevated JVP or carotid bruits, no thyromegaly. Lungs: Clear to auscultation, nonlabored breathing at rest. Cardiac: Regular rate and rhythm, no S3, 1/6 systolic murmur, no pericardial rub. Extremities: No pitting edema.  ECG:  An ECG dated 05/27/2020 was personally reviewed today and demonstrated:   Sinus rhythm with old inferior infarct pattern, possible old anterior infarct pattern, low voltage in the limb leads.  Recent Labwork: 07/12/2020: ALT 17; AST 16; BUN 22; Creatinine, Ser 1.15; Hemoglobin 16.5; Platelets 164; Potassium 5.3; Sodium 139     Component Value Date/Time   CHOL 110 07/12/2020 1139   TRIG 88 07/12/2020 1139   HDL 38 (L) 07/12/2020 1139   CHOLHDL 2.9 07/12/2020 1139   CHOLHDL 3.3 02/08/2014 1133   VLDL 33 02/08/2014 1133   LDLCALC 55 07/12/2020 1139    Other Studies Reviewed Today:  Lexiscan Myoview 06/02/2020:  No diagnostic ST segment changes to indicate ischemia.  Small, mild intensity, apical anteroseptal defect that is fixed, also partially reversible basal inferoseptal/inferolateral defect. This is consistent with underlying ischemic heart disease, predominantly scar with mild peri-infarct ischemia in the basal inferoseptal/inferolateral zone.  This is a low risk study.  Nuclear stress EF: 63%.  Assessment and Plan:  1.  Multivessel CAD status post CABG in 2006.  He reports no active angina at this time.  I encouraged him to resume a regular walking plan for exercise.  Continue Plavix, lisinopril, and Zocor.  Follow-up Myoview in January of this year was overall low risk showing evidence of scar with mild peri-infarct ischemia in the basal inferoseptal/inferolateral walls.  LVEF normal at 63%.  2.  Asymptomatic AAA measuring 5.5 cm by last assessment.  He is being followed by Dr. Scot Dock.  Based on RCRI cardiac risk calculation he would be a high risk at class IV, 11% chance of major adverse cardiac event.  It would not be expected that coronary revascularization would reduce this risk substantially particularly in light of the fact that he is not reporting significant angina on medical therapy and his Myoview was low risk in terms of ischemic burden.  3.  Mixed hyperlipidemia on Zocor, LDL 55.  Medication Adjustments/Labs and Tests Ordered: Current  medicines are reviewed at length with the patient today.  Concerns regarding medicines are outlined above.   Tests Ordered: No orders of the defined types were placed in this encounter.   Medication Changes: No orders of the defined types were placed in this encounter.   Disposition:  Follow up 6 months.  Signed, Satira Sark, MD, [facility] 10/06/2020 9:44 AM    [facility] [place]. [address], [place], [place] [address] Phone: [phone]; Fax: [phone]

## 2020-10-06 ENCOUNTER — Encounter: Payer: Self-pay | Admitting: Cardiology

## 2020-10-06 ENCOUNTER — Other Ambulatory Visit: Payer: Self-pay

## 2020-10-06 ENCOUNTER — Ambulatory Visit (INDEPENDENT_AMBULATORY_CARE_PROVIDER_SITE_OTHER): Payer: Medicare Other | Admitting: Cardiology

## 2020-10-06 VITALS — BP 136/78 | HR 73 | Ht 70.0 in | Wt 191.6 lb

## 2020-10-06 DIAGNOSIS — I714 Abdominal aortic aneurysm, without rupture, unspecified: Secondary | ICD-10-CM

## 2020-10-06 DIAGNOSIS — E782 Mixed hyperlipidemia: Secondary | ICD-10-CM

## 2020-10-06 DIAGNOSIS — I25119 Atherosclerotic heart disease of native coronary artery with unspecified angina pectoris: Secondary | ICD-10-CM | POA: Diagnosis not present

## 2020-10-06 NOTE — Patient Instructions (Signed)
Medication Instructions:  Your physician recommends that you continue on your current medications as directed. Please refer to the Current Medication list given to you today.  *If you need a refill on your cardiac medications before your next appointment, please call your pharmacy*   Lab Work: Your physician recommends that you continue on your current medications as directed. Please refer to the Current Medication list given to you today.  If you have labs (blood work) drawn today and your tests are completely normal, you will receive your results only by: Marland Kitchen MyChart Message (if you have MyChart) OR . A paper copy in the mail If you have any lab test that is abnormal or we need to change your treatment, we will call you to review the results.   Testing/Procedures: None today   Follow-Up: At [facility], you and your health needs are our priority.  As part of our continuing mission to provide you with exceptional heart care, we have created designated Provider Care Teams.  These Care Teams include your primary Cardiologist (physician) and Advanced Practice Providers (APPs -  Physician Assistants and Nurse Practitioners) who all work together to provide you with the care you need, when you need it.  We recommend signing up for the patient portal called "MyChart".  Sign up information is provided on this After Visit Summary.  MyChart is used to connect with patients for Virtual Visits (Telemedicine).  Patients are able to view lab/test results, encounter notes, upcoming appointments, etc.  Non-urgent messages can be sent to your provider as well.   To learn more about what you can do with MyChart, go to [email or web address].    Your next appointment:   6 month(s)  The format for your next appointment:   In Person  Provider:   Rozann Lesches, MD   Other Instructions None

## 2020-10-07 NOTE — Telephone Encounter (Signed)
Left message to return call 

## 2020-10-19 ENCOUNTER — Encounter: Payer: Self-pay | Admitting: Family Medicine

## 2020-10-19 ENCOUNTER — Other Ambulatory Visit: Payer: Self-pay

## 2020-10-19 ENCOUNTER — Ambulatory Visit (INDEPENDENT_AMBULATORY_CARE_PROVIDER_SITE_OTHER): Payer: Medicare Other | Admitting: Family Medicine

## 2020-10-19 VITALS — BP 173/90 | HR 74 | Temp 98.2°F | Ht 70.0 in | Wt 188.0 lb

## 2020-10-19 DIAGNOSIS — I679 Cerebrovascular disease, unspecified: Secondary | ICD-10-CM

## 2020-10-19 DIAGNOSIS — I2510 Atherosclerotic heart disease of native coronary artery without angina pectoris: Secondary | ICD-10-CM | POA: Diagnosis not present

## 2020-10-19 DIAGNOSIS — F5101 Primary insomnia: Secondary | ICD-10-CM | POA: Diagnosis not present

## 2020-10-19 DIAGNOSIS — E7801 Familial hypercholesterolemia: Secondary | ICD-10-CM

## 2020-10-19 DIAGNOSIS — E118 Type 2 diabetes mellitus with unspecified complications: Secondary | ICD-10-CM | POA: Diagnosis not present

## 2020-10-19 DIAGNOSIS — I10 Essential (primary) hypertension: Secondary | ICD-10-CM

## 2020-10-19 DIAGNOSIS — Z794 Long term (current) use of insulin: Secondary | ICD-10-CM | POA: Diagnosis not present

## 2020-10-19 MED ORDER — SIMVASTATIN 80 MG PO TABS: 80.0000 mg | ORAL_TABLET | Freq: Every day | ORAL | 1 refills | Status: DC

## 2020-10-19 MED ORDER — TEMAZEPAM 30 MG PO CAPS: ORAL_CAPSULE | ORAL | 2 refills | Status: DC

## 2020-10-19 MED ORDER — NORTRIPTYLINE HCL 25 MG PO CAPS: ORAL_CAPSULE | ORAL | 1 refills | Status: DC

## 2020-10-19 MED ORDER — LISINOPRIL 20 MG PO TABS: 20.0000 mg | ORAL_TABLET | Freq: Two times a day (BID) | ORAL | 1 refills | Status: DC

## 2020-10-19 MED ORDER — ALPRAZOLAM 0.5 MG PO TABS: ORAL_TABLET | ORAL | 2 refills | Status: DC

## 2020-10-19 MED ORDER — CLOPIDOGREL BISULFATE 75 MG PO TABS: 1.0000 | ORAL_TABLET | Freq: Every day | ORAL | 1 refills | Status: DC

## 2020-10-19 MED ORDER — AMLODIPINE BESYLATE 2.5 MG PO TABS: 2.5000 mg | ORAL_TABLET | Freq: Every day | ORAL | 1 refills | Status: DC

## 2020-10-19 NOTE — Progress Notes (Signed)
Patient ID: [name], male    DOB: 1941/01/21, 80 y.o.   MRN: [id number]   Chief Complaint  Patient presents with   Hypertension   Subjective:    HPI F/u htn-  Takes meds every day. Tries to eat healthy, exercises some.   Has elevated bp today in office-pt stating had eatening some salty peanuts and coke prior to coming.  Pt stating he took pressure today -131/71 today at home. Taking lisinopril 33m bid and doing well.  Cardiology- seeing them every 6 months. For h/o atherosclerosis cvd, cerbrovascular disease, pt taking plavix.  Insomnia- taking temazepam nightly. Xanax at night 0.534mPt stating doing okay on these medications.  No falls or memory concerns.  Medical History [place] a past medical history of Abdominal aortic aneurysm ([facility] Adenomatous polyps, CAD (coronary artery disease), Carotid artery disease ([facility] Degenerative joint disease of spine, Diverticulosis, ED (erectile dysfunction), Essential hypertension, Hyperlipidemia, Insomnia, Neuropathy, PAD (peripheral artery disease) ([facility] Sinus headache, Stroke ([facility] TIA (transient ischemic attack) (2008), and Type 2 diabetes mellitus ([facility]   Outpatient Encounter Medications as of 10/19/2020  Medication Sig   amLODipine (NORVASC) 2.5 MG tablet Take 1 tablet (2.5 mg total) by mouth daily.   blood glucose meter kit and supplies KIT Dispense based on patient and insurance preference. Use up to four times daily to check your blood sugar (ICD-10 code E11.9)   Multiple Vitamin (MULTIVITAMIN) tablet Take 1 tablet by mouth daily.   Omega-3 Fatty Acids (FISH OIL) 1000 MG CAPS Take 1 capsule by mouth daily.   [DISCONTINUED] ALPRAZolam (XANAX) 0.5 MG tablet TAKE 1 TABLET ONCE DAILY AT BEDTIME AS NEEDED FOR ANXIETY   [DISCONTINUED] clopidogrel (PLAVIX) 75 MG tablet TAKE 1 TABLET BY MOUTH ONCE DAILY.   [DISCONTINUED] lisinopril (ZESTRIL) 20 MG tablet Take 1 tablet (20 mg total) by mouth 2 (two) times daily.    [DISCONTINUED] nortriptyline (PAMELOR) 25 MG capsule TAKE (1) CAPSULE DAILY AT BEDTIME.   [DISCONTINUED] simvastatin (ZOCOR) 80 MG tablet Take 1 tablet (80 mg total) by mouth daily.   [DISCONTINUED] temazepam (RESTORIL) 30 MG capsule TAKE (1) CAPSULE DAILY AT BEDTIME.   ALPRAZolam (XANAX) 0.5 MG tablet Take 1 tab p.o. qhs.   clopidogrel (PLAVIX) 75 MG tablet Take 1 tablet (75 mg total) by mouth daily.   lisinopril (ZESTRIL) 20 MG tablet Take 1 tablet (20 mg total) by mouth 2 (two) times daily.   nortriptyline (PAMELOR) 25 MG capsule TAKE (1) CAPSULE DAILY AT BEDTIME.   simvastatin (ZOCOR) 80 MG tablet Take 1 tablet (80 mg total) by mouth daily.   temazepam (RESTORIL) 30 MG capsule Take 1 tab p.o. qhs   No facility-administered encounter medications on file as of 10/19/2020.     Review of Systems  Constitutional:  Negative for chills and fever.  HENT:  Negative for congestion, rhinorrhea and sore throat.   Respiratory:  Negative for cough, shortness of breath and wheezing.   Cardiovascular:  Negative for chest pain and leg swelling.  Gastrointestinal:  Negative for abdominal pain, diarrhea, nausea and vomiting.  Genitourinary:  Negative for dysuria and frequency.  Skin:  Negative for rash.  Neurological:  Negative for dizziness, weakness and headaches.    Vitals BP (!) 173/90 Comment: recheck 177/95  Pulse 74   Temp 98.2 F (36.8 C)   Ht 5' 10"  (1.778 m)   Wt 188 lb (85.3 kg)   SpO2 99%   BMI 26.98 kg/m   Objective:   Physical Exam Vitals and  nursing note reviewed.  Constitutional:      General: He is not in acute distress.    Appearance: Normal appearance. He is not ill-appearing.  Cardiovascular:     Rate and Rhythm: Normal rate and regular rhythm.     Pulses: Normal pulses.     Heart sounds: Normal heart sounds.  Pulmonary:     Effort: Pulmonary effort is normal. No respiratory distress.     Breath sounds: Normal breath sounds.  Musculoskeletal:        General:  Normal range of motion.  Skin:    General: Skin is warm and dry.     Findings: No rash.  Neurological:     General: No focal deficit present.     Mental Status: He is alert and oriented to person, place, and time.  Psychiatric:        Mood and Affect: Mood normal.        Behavior: Behavior normal.        Thought Content: Thought content normal.        Judgment: Judgment normal.     Assessment and Plan   1. Primary hypertension - amLODipine (NORVASC) 2.5 MG tablet; Take 1 tablet (2.5 mg total) by mouth daily.  Dispense: 90 tablet; Refill: 1 - lisinopril (ZESTRIL) 20 MG tablet; Take 1 tablet (20 mg total) by mouth 2 (two) times daily.  Dispense: 180 tablet; Refill: 1  2. Primary insomnia - ALPRAZolam (XANAX) 0.5 MG tablet; Take 1 tab p.o. qhs.  Dispense: 30 tablet; Refill: 2 - temazepam (RESTORIL) 30 MG capsule; Take 1 tab p.o. qhs  Dispense: 30 capsule; Refill: 2 - nortriptyline (PAMELOR) 25 MG capsule; TAKE (1) CAPSULE DAILY AT BEDTIME.  Dispense: 90 capsule; Refill: 1  3. Familial hypercholesterolemia - simvastatin (ZOCOR) 80 MG tablet; Take 1 tablet (80 mg total) by mouth daily.  Dispense: 90 tablet; Refill: 1  4. Controlled type 2 diabetes mellitus with complication, with long-term current use of insulin ([place])  5. Arteriosclerotic cardiovascular disease (ASCVD)  6. Cerebrovascular disease - clopidogrel (PLAVIX) 75 MG tablet; Take 1 tablet (75 mg total) by mouth daily.  Dispense: 90 tablet; Refill: 1   Htn- uncontrolled- Gave 2.36m norvasc to add to lisionpril 217mbid.  Pt to call back with bp numbers next week.  Insomnia- pt taking temazepam and xanax.  Discussed risk of taking these medications long term with falls and memory loss.  Pt voiced understanding and wishing to continue with these medications. This past year we decreased xanax from 48m35mo 0.5mg348ms. Pt also on nortriptyline 25mg248m 30mg 40mzepam, has been on these medications for years, per chart likely before  2012.   CVD- pt to f/u cardiology and cont with plavix, bp meds, and zocor.  DM2- stable. Cont meds.  A1c 5.8, controlled.  BP Readings from Last 3 Encounters:  10/19/20 (!) 173/90  10/06/20 136/78  07/11/20 (!) 145/88    Return in about 3 months (around 01/19/2021) for f/u insomnia/htn.  10/30/2020

## 2020-10-19 NOTE — Patient Instructions (Signed)
Check blood pressure daily and call next week with numbers.

## 2020-10-24 NOTE — Progress Notes (Deleted)
Subjective:   [name] is a 80 y.o. male who presents for Medicare Annual/Subsequent preventive examination.  I connected with [name] today by telephone and verified that I am speaking with the correct person using two identifiers. Location patient: home Location provider: work Persons participating in the virtual visit: patient, provider.   I discussed the limitations, risks, security and privacy concerns of performing an evaluation and management service by telephone and the availability of in person appointments. I also discussed with the patient that there may be a patient responsible charge related to this service. The patient expressed understanding and verbally consented to this telephonic visit.    Interactive audio and video telecommunications were attempted between this provider and patient, however failed, due to patient having technical difficulties OR patient did not have access to video capability.  We continued and completed visit with audio only.     Review of Systems    N/A        Objective:    There were no vitals filed for this visit. There is no height or weight on file to calculate BMI.  Advanced Directives 03/11/2019 03/12/2018 10/02/2017 06/21/2017 06/19/2017 06/18/2017 01/16/2017  Does Patient Have a Medical Advance Directive? Yes Yes Yes - Yes No Yes  Type of Advance Directive - [place];Living will [place];Living will - Living will - -  Does patient want to make changes to medical advance directive? No - Patient declined - - No - Patient declined No - Patient declined - -  Copy of [place] in Chart? - No - copy requested - - - - -  Would patient like information on creating a medical advance directive? - - - No - Patient declined No - Patient declined No - Patient declined -    Current Medications (verified) Outpatient Encounter Medications as of 10/25/2020  Medication Sig   ALPRAZolam  (XANAX) 0.5 MG tablet Take 1 tab p.o. qhs.   amLODipine (NORVASC) 2.5 MG tablet Take 1 tablet (2.5 mg total) by mouth daily.   blood glucose meter kit and supplies KIT Dispense based on patient and insurance preference. Use up to four times daily to check your blood sugar (ICD-10 code E11.9)   clopidogrel (PLAVIX) 75 MG tablet Take 1 tablet (75 mg total) by mouth daily.   lisinopril (ZESTRIL) 20 MG tablet Take 1 tablet (20 mg total) by mouth 2 (two) times daily.   Multiple Vitamin (MULTIVITAMIN) tablet Take 1 tablet by mouth daily.   nortriptyline (PAMELOR) 25 MG capsule TAKE (1) CAPSULE DAILY AT BEDTIME.   Omega-3 Fatty Acids (FISH OIL) 1000 MG CAPS Take 1 capsule by mouth daily.   simvastatin (ZOCOR) 80 MG tablet Take 1 tablet (80 mg total) by mouth daily.   temazepam (RESTORIL) 30 MG capsule Take 1 tab p.o. qhs   No facility-administered encounter medications on file as of 10/25/2020.    Allergies (verified) Atorvastatin   History: Past Medical History:  Diagnosis Date   Abdominal aortic aneurysm (HCC)    Adenomatous polyps    CAD (coronary artery disease)    CABG in 2006   Carotid artery disease (HCC)    Degenerative joint disease of spine    Lumbosacral and cervical; ant. discectomy, fusion and plate in 8828   Diverticulosis    ED (erectile dysfunction)    Essential hypertension    Hyperlipidemia    Insomnia    Neuropathy    PAD (peripheral artery disease) (  [place])    Left femoral art. stent; bilateral renal art.stents - 2007   Sinus headache    Stroke [facility])    TIA (transient ischemic attack) 2008   Type 2 diabetes mellitus ([place])    Past Surgical History:  Procedure Laterality Date   ANTERIOR FUSION CERVICAL SPINE  2005    COLONOSCOPY W/ POLYPECTOMY  2010   CORONARY ARTERY BYPASS GRAFT  2006   LIMA to LAD; SVG to marginal; SVG to L posterolateral; nondoninant RCA    LUMBAR SPINE SURGERY  1985    Discectomy and fusion   MASS EXCISION Left 01/22/2014   Procedure:  EXCISION LEFT WRIST MASS;  Surgeon: Mcarthur Rossetti, MD;  Location: [facility];  Service: Orthopedics;  Laterality: Left;   Family History  Problem Relation Age of Onset   Stroke Father    Pulmonary embolism Mother        Following hip fracture   Colon cancer Brother    Hypertension Brother    Social History   Socioeconomic History   Marital status: Married    Spouse name: Not on file   Number of children: Not on file   Years of education: Not on file   Highest education level: Not on file  Occupational History   Occupation: Retired  Tobacco Use   Smoking status: Former    Packs/day: 1.00    Years: 50.00    Pack years: 50.00    Types: Cigarettes    Start date: 05/10/1952    Quit date: 10/18/2003    Years since quitting: 17.0   Smokeless tobacco: Never   Tobacco comments:    50 pack years; quit in 2005   Vaping Use   Vaping Use: Never used  Substance and Sexual Activity   Alcohol use: No    Alcohol/week: 0.0 standard drinks   Drug use: No   Sexual activity: Not on file  Other Topics Concern   Not on file  Social History Narrative   Retired; married; does not get regular exercise.    Social Determinants of Health   Financial Resource Strain: Not on file  Food Insecurity: Not on file  Transportation Needs: Not on file  Physical Activity: Not on file  Stress: Not on file  Social Connections: Not on file    Tobacco Counseling Counseling given: Not Answered Tobacco comments: 50 pack years; quit in 2005    Clinical Intake:                 Diabetic? Yes Nutrition Risk Assessment:  Has the patient had any N/V/D within the last 2 months?  {YES/NO:21197} Does the patient have any non-healing wounds?  {YES/NO:21197} Has the patient had any unintentional weight loss or weight gain?  {YES/NO:21197}  Diabetes:  Is the patient diabetic?  Yes  If diabetic, was a CBG obtained today?  No  Did the patient bring in their glucometer from home?  No  How  often do you monitor your CBG's? ***.   Financial Strains and Diabetes Management:  Are you having any financial strains with the device, your supplies or your medication? {YES/NO:21197}.  Does the patient want to be seen by Chronic Care Management for management of their diabetes?  {YES/NO:21197} Would the patient like to be referred to a Nutritionist or for Diabetic Management?  {YES/NO:21197}  Diabetic Exams:  Diabetic Eye Exam: Overdue for diabetic eye exam. Pt has been advised about the importance in completing this exam. Patient advised to call and schedule  an eye exam. Diabetic Foot Exam: Overdue, Pt has been advised about the importance in completing this exam. Pt is scheduled for diabetic foot exam on ***.          Activities of Daily Living No flowsheet data found.  Patient Care Team: Erven Colla, DO as PCP - General (Family Medicine) Satira Sark, MD as PCP - Cardiology (Cardiology) Gala Romney Cristopher Estimable, MD (Gastroenterology) Mal Misty, MD (Inactive) as Consulting Physician (Thoracic Diseases)  Indicate any recent Medical Services you may have received from other than [facility] providers in the past year (date may be approximate).     Assessment:   This is a routine wellness examination for [employer].  Hearing/Vision screen No results found.  Dietary issues and exercise activities discussed:     Goals Addressed   None    Depression Screen PHQ 2/9 Scores 10/19/2020 10/14/2019 10/23/2017 08/16/2016 02/14/2015  PHQ - 2 Score 0 0 0 0 0    Fall Risk Fall Risk  10/19/2020 10/14/2019 08/19/2017 08/16/2016 02/14/2015  Falls in the past year? 0 0 No No No  Number falls in past yr: - 0 - - -  Injury with Fall? - 0 - - -  Follow up Falls evaluation completed Falls evaluation completed - - -    FALL RISK PREVENTION PERTAINING TO THE HOME:  Any stairs in or around the home? {YES/NO:21197} If so, are there any without handrails? {YES/NO:21197} Home free of loose throw rugs  in walkways, pet beds, electrical cords, etc? Yes  Adequate lighting in your home to reduce risk of falls? Yes   ASSISTIVE DEVICES UTILIZED TO PREVENT FALLS:  Life alert? {YES/NO:21197} Use of a cane, walker or w/c? {YES/NO:21197} Grab bars in the bathroom? {YES/NO:21197} Shower chair or bench in shower? {YES/NO:21197} Elevated toilet seat or a handicapped toilet? {YES/NO:21197}    Cognitive Function:        Immunizations Immunization History  Administered Date(s) Administered   Influenza Split 01/28/2013   Influenza,inj,Quad PF,6+ Mos 02/14/2015, 02/14/2016, 02/11/2017, 02/18/2018, 01/29/2019   Influenza-Unspecified 01/23/2006, 03/08/2020   Pneumococcal Conjugate-13 08/13/2014   Pneumococcal Polysaccharide-23 05/14/2004   Tdap 01/20/2018    TDAP status: Up to date  Flu Vaccine status: Up to date  Pneumococcal vaccine status: Up to date  {Covid-19 vaccine status:2101808}  Qualifies for Shingles Vaccine? Yes   Zostavax completed No   Shingrix Completed?: No.    Education has been provided regarding the importance of this vaccine. Patient has been advised to call insurance company to determine out of pocket expense if they have not yet received this vaccine. Advised may also receive vaccine at local pharmacy or Health Dept. Verbalized acceptance and understanding.  Screening Tests Health Maintenance  Topic Date Due   OPHTHALMOLOGY EXAM  Never done   Zoster Vaccines- Shingrix (1 of 2) Never done   FOOT EXAM  02/19/2019   INFLUENZA VACCINE  12/12/2020   HEMOGLOBIN A1C  01/12/2021   TETANUS/TDAP  01/21/2028   PNA vac Low Risk Adult  Completed   HPV VACCINES  Aged Out   COVID-19 Vaccine  Discontinued    Health Maintenance  Health Maintenance Due  Topic Date Due   OPHTHALMOLOGY EXAM  Never done   Zoster Vaccines- Shingrix (1 of 2) Never done   FOOT EXAM  02/19/2019    Colorectal cancer screening: No longer required.   Lung Cancer Screening: (Low Dose CT  Chest recommended if Age 55-80 years, 30 pack-year currently smoking OR have quit w/in  15years.) does not qualify.   Lung Cancer Screening Referral: N/A   Additional Screening:  Hepatitis C Screening: does not qualify;   Vision Screening: Recommended annual ophthalmology exams for early detection of glaucoma and other disorders of the eye. Is the patient up to date with their annual eye exam?  {YES/NO:21197} Who is the provider or what is the name of the office in which the patient attends annual eye exams? *** If pt is not established with a provider, would they like to be referred to a provider to establish care? {YES/NO:21197}.   Dental Screening: Recommended annual dental exams for proper oral hygiene  Community Resource Referral / Chronic Care Management: CRR required this visit?  No   CCM required this visit?  No      Plan:     I have personally reviewed and noted the following in the patient's chart:   Medical and social history Use of alcohol, tobacco or illicit drugs  Current medications and supplements including opioid prescriptions. Patient is not currently taking opioid prescriptions. Functional ability and status Nutritional status Physical activity Advanced directives List of other physicians Hospitalizations, surgeries, and ER visits in previous 12 months Vitals Screenings to include cognitive, depression, and falls Referrals and appointments  In addition, I have reviewed and discussed with patient certain preventive protocols, quality metrics, and best practice recommendations. A written personalized care plan for preventive services as well as general preventive health recommendations were provided to patient.     Ofilia Neas, LPN   07/25/9700   Nurse Notes: None

## 2020-10-25 ENCOUNTER — Ambulatory Visit: Payer: Medicare Other

## 2020-10-30 ENCOUNTER — Encounter: Payer: Self-pay | Admitting: Family Medicine

## 2020-11-17 ENCOUNTER — Telehealth: Payer: Self-pay | Admitting: Family Medicine

## 2020-11-17 NOTE — Telephone Encounter (Signed)
Patient was told to call back with BP readings with new blood pressure medication he is using. 6/26-149/78 pulse-78 6/27-153/83        70 6/28-138/74            73 6/31-133/73            70 7/2-133/74               65 7/3-116/68                75 7/4-144/75                65 7/6-136/75                70 7/7-124/72                75

## 2020-11-17 NOTE — Telephone Encounter (Signed)
Discussed with pt. Pt verbalized understanding.  °

## 2020-12-05 DIAGNOSIS — M79675 Pain in left toe(s): Secondary | ICD-10-CM | POA: Diagnosis not present

## 2020-12-05 DIAGNOSIS — B351 Tinea unguium: Secondary | ICD-10-CM | POA: Diagnosis not present

## 2020-12-05 DIAGNOSIS — I739 Peripheral vascular disease, unspecified: Secondary | ICD-10-CM | POA: Diagnosis not present

## 2020-12-05 DIAGNOSIS — L851 Acquired keratosis [keratoderma] palmaris et plantaris: Secondary | ICD-10-CM | POA: Diagnosis not present

## 2020-12-05 DIAGNOSIS — M79674 Pain in right toe(s): Secondary | ICD-10-CM | POA: Diagnosis not present

## 2020-12-19 ENCOUNTER — Telehealth: Payer: Self-pay | Admitting: Family Medicine

## 2020-12-19 ENCOUNTER — Other Ambulatory Visit: Payer: Self-pay | Admitting: Family Medicine

## 2020-12-19 DIAGNOSIS — F5101 Primary insomnia: Secondary | ICD-10-CM

## 2020-12-19 NOTE — Telephone Encounter (Signed)
Pls call pt and let him know that for his insomnia medications, that the new provider might not cover these medications.  Pt will need referral to psychiatry for the temazepam and alprazolam.  I would recommend trying to taper down to '15mg'$  temazepam instead of '30mg'$ .  Give pt referral if pt wishing to stay on these medications.   Thanks,   Dr. Lovena Le   Contacted patient; pt would like to hold off on referral at this time. Upon talking with PCP, referrals are to be made if pt on certain meds. Referral placed.

## 2020-12-19 NOTE — Telephone Encounter (Signed)
Pt contacted and verbalized understanding. Pt states he would like to wait on referral at this time. Pt has appt coming up on 01/17/21.

## 2021-01-16 ENCOUNTER — Other Ambulatory Visit: Payer: Self-pay | Admitting: Family Medicine

## 2021-01-16 DIAGNOSIS — F5101 Primary insomnia: Secondary | ICD-10-CM

## 2021-01-17 ENCOUNTER — Encounter: Payer: Self-pay | Admitting: Family Medicine

## 2021-01-17 ENCOUNTER — Other Ambulatory Visit: Payer: Self-pay

## 2021-01-17 ENCOUNTER — Ambulatory Visit (INDEPENDENT_AMBULATORY_CARE_PROVIDER_SITE_OTHER): Payer: Medicare Other | Admitting: Family Medicine

## 2021-01-17 VITALS — BP 122/68 | HR 72 | Temp 97.2°F | Wt 190.4 lb

## 2021-01-17 DIAGNOSIS — E118 Type 2 diabetes mellitus with unspecified complications: Secondary | ICD-10-CM

## 2021-01-17 DIAGNOSIS — I679 Cerebrovascular disease, unspecified: Secondary | ICD-10-CM

## 2021-01-17 DIAGNOSIS — E7801 Familial hypercholesterolemia: Secondary | ICD-10-CM

## 2021-01-17 DIAGNOSIS — F5101 Primary insomnia: Secondary | ICD-10-CM | POA: Diagnosis not present

## 2021-01-17 DIAGNOSIS — Z794 Long term (current) use of insulin: Secondary | ICD-10-CM | POA: Diagnosis not present

## 2021-01-17 DIAGNOSIS — D692 Other nonthrombocytopenic purpura: Secondary | ICD-10-CM

## 2021-01-17 DIAGNOSIS — I10 Essential (primary) hypertension: Secondary | ICD-10-CM | POA: Diagnosis not present

## 2021-01-17 MED ORDER — AMLODIPINE BESYLATE 2.5 MG PO TABS: 2.5000 mg | ORAL_TABLET | Freq: Every day | ORAL | 1 refills | Status: DC

## 2021-01-17 MED ORDER — LISINOPRIL 20 MG PO TABS: 20.0000 mg | ORAL_TABLET | Freq: Two times a day (BID) | ORAL | 1 refills | Status: DC

## 2021-01-17 MED ORDER — CLOPIDOGREL BISULFATE 75 MG PO TABS: 75.0000 mg | ORAL_TABLET | Freq: Every day | ORAL | 1 refills | Status: DC

## 2021-01-17 MED ORDER — ALPRAZOLAM 0.5 MG PO TABS: ORAL_TABLET | ORAL | 2 refills | Status: DC

## 2021-01-17 MED ORDER — SIMVASTATIN 80 MG PO TABS: 80.0000 mg | ORAL_TABLET | Freq: Every day | ORAL | 1 refills | Status: DC

## 2021-01-17 MED ORDER — TEMAZEPAM 30 MG PO CAPS
ORAL_CAPSULE | ORAL | 2 refills | Status: DC
Start: 2021-01-17 — End: 2021-04-19

## 2021-01-17 MED ORDER — NORTRIPTYLINE HCL 25 MG PO CAPS: ORAL_CAPSULE | ORAL | 1 refills | Status: DC

## 2021-01-17 NOTE — Progress Notes (Signed)
Patient ID: [name], male    DOB: 29-Nov-1940, 80 y.o.   MRN: [id number]   Chief Complaint  Patient presents with   Hypertension   Diabetes   Subjective:    HPI  Pt here for follow up on HTN and DM. Pt states he is doing well, HTN doing well. Sugars have been running good; checking sugars every morning (this morning 104)  Hr at home 72-73.  Seeing 945-038 systolic at home. Using wrist band to cover it up. Has thin skin on forearms.   Chronic insomnia- Sleeping- waking up at night-2-3 am.  Working swing shifts for many years and affecting his sleep.   Pt stating drank some of the water from [place].  Pt wondering if has affected him.  Is going to look into the case going on for the military that was affected by this water.  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  Small lump came back again, removed again.  No problems on left breast now.  Seeing Dr. Gershon Crane eye exam-no new concerns.  Up-to-date.  Seeing podiatry on the feet, and seeing them.  Has neuropathy.  Medical History [name] has a past medical history of Abdominal aortic aneurysm ([place]), Adenomatous polyps, CAD (coronary artery disease), Carotid artery disease ([place]), Degenerative joint disease of spine, Diverticulosis, ED (erectile dysfunction), Essential hypertension, Hyperlipidemia, Insomnia, Neuropathy, PAD (peripheral artery disease) ([place]), Sinus headache, Stroke ([place]), TIA (transient ischemic attack) (2008), and Type 2 diabetes mellitus ([place]).   Outpatient Encounter Medications as of 01/17/2021  Medication Sig   blood glucose meter kit and supplies KIT Dispense based on patient and insurance preference. Use up to four times daily to check your blood sugar (ICD-10 code E11.9)   Multiple Vitamin (MULTIVITAMIN) tablet Take 1 tablet by mouth daily.   Omega-3 Fatty Acids (FISH OIL) 1000 MG CAPS Take 1 capsule by mouth daily.   [DISCONTINUED] ALPRAZolam (XANAX) 0.5 MG tablet Take 1 tab  p.o. qhs.   [DISCONTINUED] amLODipine (NORVASC) 2.5 MG tablet Take 1 tablet (2.5 mg total) by mouth daily.   [DISCONTINUED] clopidogrel (PLAVIX) 75 MG tablet Take 1 tablet (75 mg total) by mouth daily.   [DISCONTINUED] lisinopril (ZESTRIL) 20 MG tablet Take 1 tablet (20 mg total) by mouth 2 (two) times daily.   [DISCONTINUED] nortriptyline (PAMELOR) 25 MG capsule TAKE (1) CAPSULE DAILY AT BEDTIME.   [DISCONTINUED] simvastatin (ZOCOR) 80 MG tablet Take 1 tablet (80 mg total) by mouth daily.   [DISCONTINUED] temazepam (RESTORIL) 30 MG capsule Take 1 tab p.o. qhs   ALPRAZolam (XANAX) 0.5 MG tablet Take 1 tab p.o. qhs.   amLODipine (NORVASC) 2.5 MG tablet Take 1 tablet (2.5 mg total) by mouth daily.   clopidogrel (PLAVIX) 75 MG tablet Take 1 tablet (75 mg total) by mouth daily.   lisinopril (ZESTRIL) 20 MG tablet Take 1 tablet (20 mg total) by mouth 2 (two) times daily.   nortriptyline (PAMELOR) 25 MG capsule TAKE (1) CAPSULE DAILY AT BEDTIME.   simvastatin (ZOCOR) 80 MG tablet Take 1 tablet (80 mg total) by mouth daily.   temazepam (RESTORIL) 30 MG capsule Take 1 tab p.o. qhs   No facility-administered encounter medications on file as of 01/17/2021.     Review of Systems  Constitutional:  Negative for chills and fever.  HENT:  Negative for congestion, rhinorrhea and sore throat.   Respiratory:  Negative for cough, shortness of breath and wheezing.   Cardiovascular:  Negative  for chest pain and leg swelling.  Gastrointestinal:  Negative for abdominal pain, diarrhea, nausea and vomiting.  Genitourinary:  Negative for dysuria and frequency.  Skin:  Negative for rash.  Neurological:  Negative for dizziness, weakness and headaches.  Hematological:  Bruises/bleeds easily.  Psychiatric/Behavioral:  Positive for sleep disturbance (chronic). Negative for confusion, decreased concentration, dysphoric mood, self-injury and suicidal ideas. The patient is not nervous/anxious.     Vitals BP 122/68    Pulse 72   Temp (!) 97.2 F (36.2 C)   Wt 190 lb 6.4 oz (86.4 kg)   SpO2 97%   BMI 27.32 kg/m   Objective:   Physical Exam Vitals and nursing note reviewed.  Constitutional:      General: He is not in acute distress.    Appearance: Normal appearance. He is not ill-appearing.  HENT:     Head: Normocephalic.     Nose: Nose normal. No congestion.     Mouth/Throat:     Mouth: Mucous membranes are moist.     Pharynx: No oropharyngeal exudate.  Eyes:     Extraocular Movements: Extraocular movements intact.     Conjunctiva/sclera: Conjunctivae normal.     Pupils: Pupils are equal, round, and reactive to light.  Cardiovascular:     Rate and Rhythm: Normal rate and regular rhythm.     Pulses: Normal pulses.     Heart sounds: Normal heart sounds. No murmur heard. Pulmonary:     Effort: Pulmonary effort is normal.     Breath sounds: Normal breath sounds. No wheezing, rhonchi or rales.  Musculoskeletal:        General: Normal range of motion.     Right lower leg: No edema.     Left lower leg: No edema.  Skin:    General: Skin is warm and dry.     Findings: Bruising (On bilateral forearms.) present. No rash.  Neurological:     General: No focal deficit present.     Mental Status: He is alert and oriented to person, place, and time.     Cranial Nerves: No cranial nerve deficit.  Psychiatric:        Mood and Affect: Mood normal.        Behavior: Behavior normal.        Thought Content: Thought content normal.        Judgment: Judgment normal.     Assessment and Plan   1. Controlled type 2 diabetes mellitus with complication, with long-term current use of insulin (HCC) - CBC - CMP14+EGFR - Hemoglobin A1c - Lipid panel  2. Senile purpura ([place])  3. Primary hypertension - CMP14+EGFR - amLODipine (NORVASC) 2.5 MG tablet; Take 1 tablet (2.5 mg total) by mouth daily.  Dispense: 90 tablet; Refill: 1 - lisinopril (ZESTRIL) 20 MG tablet; Take 1 tablet (20 mg total) by mouth 2  (two) times daily.  Dispense: 180 tablet; Refill: 1  4. Familial hypercholesterolemia - Lipid panel - simvastatin (ZOCOR) 80 MG tablet; Take 1 tablet (80 mg total) by mouth daily.  Dispense: 90 tablet; Refill: 1  5. Primary insomnia - ALPRAZolam (XANAX) 0.5 MG tablet; Take 1 tab p.o. qhs.  Dispense: 30 tablet; Refill: 2 - temazepam (RESTORIL) 30 MG capsule; Take 1 tab p.o. qhs  Dispense: 30 capsule; Refill: 2 - nortriptyline (PAMELOR) 25 MG capsule; TAKE (1) CAPSULE DAILY AT BEDTIME.  Dispense: 90 capsule; Refill: 1  6. Cerebrovascular disease - clopidogrel (PLAVIX) 75 MG tablet; Take 1 tablet (75 mg total) by  mouth daily.  Dispense: 90 tablet; Refill: 1   Htn-improved, controlled.  On last visit patient was taking lisinopril 20 mg twice daily.  And we added medication by giving additional 2.5 mg Norvasc.   Insomnia-stable, longstanding chronic condition.  Pt taking temazepam and xanax.  Discussed risk of taking these medications long term with falls and memory loss.  Pt voiced understanding and wishing to continue with these medications. This past year we decreased xanax from 46m to 0.565mqhs. Pt also on nortriptyline 2572mnd 55m63mmazepam, has been on these medications for years, per chart likely before 2012.    CVD- pt to f/u cardiology and cont with plavix, bp meds, and zocor.   DM2- stable. Cont meds.  A1c 5.8, controlled.   Hyperlipidemia-controlled, stable.  Continue medications.  Senile purpura,-stable.  patient has related to taking Plavix and thinning skin on the forearms.  No current cuts, sores or ulcers.  Return in about 3 months (around 04/18/2021) for f/u insomnia.

## 2021-01-18 LAB — CMP14+EGFR
ALT: 16 IU/L (ref 0–44)
AST: 19 IU/L (ref 0–40)
Albumin/Globulin Ratio: 1.9 (ref 1.2–2.2)
Albumin: 4.5 g/dL (ref 3.6–4.6)
Alkaline Phosphatase: 94 IU/L (ref 44–121)
BUN/Creatinine Ratio: 17 (ref 10–24)
BUN: 17 mg/dL (ref 8–27)
Bilirubin Total: 0.6 mg/dL (ref 0.0–1.2)
CO2: 26 mmol/L (ref 20–29)
Calcium: 9.6 mg/dL (ref 8.6–10.2)
Chloride: 102 mmol/L (ref 96–106)
Creatinine, Ser: 1.02 mg/dL (ref 0.76–1.27)
Globulin, Total: 2.4 g/dL (ref 1.5–4.5)
Glucose: 100 mg/dL — ABNORMAL HIGH (ref 65–99)
Potassium: 5.2 mmol/L (ref 3.5–5.2)
Sodium: 139 mmol/L (ref 134–144)
Total Protein: 6.9 g/dL (ref 6.0–8.5)
eGFR: 74 mL/min/{1.73_m2} (ref >59)

## 2021-01-18 LAB — CBC
Hematocrit: 45.1 % (ref 37.5–51.0)
Hemoglobin: 15.2 g/dL (ref 13.0–17.7)
MCH: 31.9 pg (ref 26.6–33.0)
MCHC: 33.7 g/dL (ref 31.5–35.7)
MCV: 95 fL (ref 79–97)
Platelets: 158 10*3/uL (ref 150–450)
RBC: 4.77 x10E6/uL (ref 4.14–5.80)
RDW: 12.1 % (ref 11.6–15.4)
WBC: 7.2 10*3/uL (ref 3.4–10.8)

## 2021-01-18 LAB — LIPID PANEL
Chol/HDL Ratio: 3 ratio (ref 0.0–5.0)
Cholesterol, Total: 114 mg/dL (ref 100–199)
HDL: 38 mg/dL — ABNORMAL LOW (ref >39)
LDL Chol Calc (NIH): 59 mg/dL (ref 0–99)
Triglycerides: 85 mg/dL (ref 0–149)
VLDL Cholesterol Cal: 17 mg/dL (ref 5–40)

## 2021-01-18 LAB — HEMOGLOBIN A1C
Est. average glucose Bld gHb Est-mCnc: 126 mg/dL
Hgb A1c MFr Bld: 6 % — ABNORMAL HIGH (ref 4.8–5.6)

## 2021-01-31 DIAGNOSIS — Z20822 Contact with and (suspected) exposure to covid-19: Secondary | ICD-10-CM | POA: Diagnosis not present

## 2021-02-13 DIAGNOSIS — B351 Tinea unguium: Secondary | ICD-10-CM | POA: Diagnosis not present

## 2021-02-13 DIAGNOSIS — M79675 Pain in left toe(s): Secondary | ICD-10-CM | POA: Diagnosis not present

## 2021-02-13 DIAGNOSIS — M79674 Pain in right toe(s): Secondary | ICD-10-CM | POA: Diagnosis not present

## 2021-02-13 DIAGNOSIS — I739 Peripheral vascular disease, unspecified: Secondary | ICD-10-CM | POA: Diagnosis not present

## 2021-02-24 DIAGNOSIS — Z23 Encounter for immunization: Secondary | ICD-10-CM | POA: Diagnosis not present

## 2021-02-27 ENCOUNTER — Telehealth: Payer: Self-pay | Admitting: Family Medicine

## 2021-02-27 NOTE — Telephone Encounter (Signed)
No answer unable to leave a message for patient to call back and schedule Medicare Annual Wellness Visit (AWV) in office.   If unable to come into the office for AWV,  please offer to do virtually or by telephone.  Last AWV: 08/19/2017  Please schedule at anytime with [facility]Nurse Health Advisor.  40 minute appointment  Any questions, please contact me at [phone]

## 2021-03-21 ENCOUNTER — Encounter: Payer: Self-pay | Admitting: Family Medicine

## 2021-03-21 ENCOUNTER — Ambulatory Visit (INDEPENDENT_AMBULATORY_CARE_PROVIDER_SITE_OTHER): Payer: Medicare Other | Admitting: Family Medicine

## 2021-03-21 ENCOUNTER — Other Ambulatory Visit: Payer: Self-pay

## 2021-03-21 DIAGNOSIS — I2510 Atherosclerotic heart disease of native coronary artery without angina pectoris: Secondary | ICD-10-CM

## 2021-03-21 DIAGNOSIS — E118 Type 2 diabetes mellitus with unspecified complications: Secondary | ICD-10-CM

## 2021-03-21 DIAGNOSIS — E785 Hyperlipidemia, unspecified: Secondary | ICD-10-CM

## 2021-03-21 DIAGNOSIS — I10 Essential (primary) hypertension: Secondary | ICD-10-CM | POA: Diagnosis not present

## 2021-03-21 DIAGNOSIS — I7142 Juxtarenal abdominal aortic aneurysm, without rupture: Secondary | ICD-10-CM

## 2021-03-21 NOTE — Assessment & Plan Note (Signed)
At goal.  Continue Zocor. 

## 2021-03-21 NOTE — Assessment & Plan Note (Signed)
Stable.  Doing well at this time.  Denies chest pain or shortness of breath.  Continue statin, Plavix, antihypertensives.

## 2021-03-21 NOTE — Progress Notes (Signed)
Subjective:  Patient ID: [name], male    DOB: 10-30-1940  Age: 80 y.o. MRN: [id number]  CC: Chief Complaint  Patient presents with   Hypertension   dm2    Insomnia    HPI:  80 year old male with an extensive past medical history presents for follow-up.  Hypertension: BP significantly elevated today.  Patient states that his home blood pressure readings are typically in the 120s/80s.  He states that he checks his blood pressure daily.  He endorses compliance with his home medications of amlodipine, lisinopril.  Hyperlipidemia: Patient's lipids have been well controlled.  Last LDL was 59 a few months ago.  He is tolerating max dose simvastatin without difficulty.  AAA: Patient is followed by vascular Dr. Doren Custard.  Last measurement was 5.5 cm.  There have been discussions regarding repair.  He was last seen in January and there has been no follow-up since that period of time.  Unsure why.  Plan to discuss with vascular.  Per notes, he is at high surgical risk.    CAD s/p CABG: Has been stable.  Denies chest pain or shortness of breath.  Followed by cardiology.  DM-2: Diet controlled currently.  Most recent A1c was 6.0.  No pharmacotherapy at this time.  Patient Active Problem List   Diagnosis Date Noted   Sensorineural hearing loss (SNHL), bilateral 02/18/2020   Senile purpura ([place]) 10/14/2019   Controlled diabetes mellitus type 2 with complications ([place]) 86/76/7209   Insomnia 01/29/2013   CAD (coronary artery disease)    Cerebrovascular disease    Hypertension    Hyperlipidemia    Tobacco abuse, in remission    Degenerative joint disease of spine    Abdominal aortic aneurysm 01/31/2010   PERIPHERAL VASCULAR DISEASE 01/31/2010    Social Hx   Social History   Socioeconomic History   Marital status: Married    Spouse name: Not on file   Number of children: Not on file   Years of education: Not on file   Highest education level: Not on file  Occupational History    Occupation: Retired  Tobacco Use   Smoking status: Former    Packs/day: 1.00    Years: 50.00    Pack years: 50.00    Types: Cigarettes    Start date: 05/10/1952    Quit date: 10/18/2003    Years since quitting: 17.4   Smokeless tobacco: Never   Tobacco comments:    50 pack years; quit in 2005   Vaping Use   Vaping Use: Never used  Substance and Sexual Activity   Alcohol use: No    Alcohol/week: 0.0 standard drinks   Drug use: No   Sexual activity: Not on file  Other Topics Concern   Not on file  Social History Narrative   Retired; married; does not get regular exercise.    Social Determinants of Health   Financial Resource Strain: Not on file  Food Insecurity: Not on file  Transportation Needs: Not on file  Physical Activity: Not on file  Stress: Not on file  Social Connections: Not on file   Review of Systems  Constitutional: Negative.   HENT:  Positive for hearing loss.   Respiratory: Negative.    Cardiovascular: Negative.     Objective:  BP (!) 165/98   Pulse 81   Temp (!) 97.3 F (36.3 C)   Ht 5\' 10"  (1.778 m)   Wt 185 lb (83.9 kg)   SpO2 98%   BMI 26.54  kg/m   BP/Weight 03/21/2021 06/18/4980 10/15/1581  Systolic BP 094 076 808  Diastolic BP 98 68 90  Wt. (Lbs) 185 190.4 188  BMI 26.54 27.32 26.98   Physical Exam Vitals and nursing note reviewed.  Constitutional:      General: He is not in acute distress.    Appearance: Normal appearance. He is not ill-appearing.  HENT:     Head: Normocephalic and atraumatic.  Eyes:     General:        Right eye: No discharge.        Left eye: No discharge.     Conjunctiva/sclera: Conjunctivae normal.  Cardiovascular:     Rate and Rhythm: Normal rate and regular rhythm.  Pulmonary:     Effort: Pulmonary effort is normal.     Breath sounds: Normal breath sounds. No wheezing, rhonchi or rales.  Abdominal:     Comments: Umbilical hernia.  Neurological:     Mental Status: He is alert.  Psychiatric:         Mood and Affect: Mood normal.        Behavior: Behavior normal.    Lab Results  Component Value Date   WBC 7.2 01/17/2021   HGB 15.2 01/17/2021   HCT 45.1 01/17/2021   PLT 158 01/17/2021   GLUCOSE 100 (H) 01/17/2021   CHOL 114 01/17/2021   TRIG 85 01/17/2021   HDL 38 (L) 01/17/2021   LDLCALC 59 01/17/2021   ALT 16 01/17/2021   AST 19 01/17/2021   NA 139 01/17/2021   K 5.2 01/17/2021   CL 102 01/17/2021   CREATININE 1.02 01/17/2021   BUN 17 01/17/2021   CO2 26 01/17/2021   PSA 0.6 08/16/2014   INR 1.1 01/19/2007   HGBA1C 6.0 (H) 01/17/2021     Assessment & Plan:   Problem List Items Addressed This Visit       Cardiovascular and Mediastinum   Hypertension    BP elevated today.  Patient endorses normal home readings.  We will continue Norvasc and lisinopril.      CAD (coronary artery disease)    Stable.  Doing well at this time.  Denies chest pain or shortness of breath.  Continue statin, Plavix, antihypertensives.      Abdominal aortic aneurysm    Spoke with vascular via epic.  Unsure of why he has not been recently seen.  Vascular states he will get him scheduled for follow up and CT.        Endocrine   Controlled diabetes mellitus type 2 with complications ([name])    Diet controlled.  At goal at this time.  We will continue to monitor closely.        Other   Hyperlipidemia    At goal.  Continue Zocor.      Follow-up:  Return in about 6 months (around 09/18/2021) for Follow up Chronic medical issues.  [name]

## 2021-03-21 NOTE — Assessment & Plan Note (Signed)
Spoke with vascular via epic.  Unsure of why he has not been recently seen.  Vascular states he will get him scheduled for follow up and CT.

## 2021-03-21 NOTE — Assessment & Plan Note (Signed)
BP elevated today.  Patient endorses normal home readings.  We will continue Norvasc and lisinopril.

## 2021-03-21 NOTE — Assessment & Plan Note (Signed)
Diet controlled.  At goal at this time.  We will continue to monitor closely.

## 2021-03-21 NOTE — Patient Instructions (Signed)
Continue your current medications.  You're doing well.  Follow up in 6 months.  Take care  Dr. Lacinda Axon

## 2021-03-23 DIAGNOSIS — Z20822 Contact with and (suspected) exposure to covid-19: Secondary | ICD-10-CM | POA: Diagnosis not present

## 2021-03-24 ENCOUNTER — Other Ambulatory Visit: Payer: Self-pay | Admitting: Family Medicine

## 2021-03-24 DIAGNOSIS — F5101 Primary insomnia: Secondary | ICD-10-CM

## 2021-03-27 DIAGNOSIS — D485 Neoplasm of uncertain behavior of skin: Secondary | ICD-10-CM | POA: Diagnosis not present

## 2021-03-27 DIAGNOSIS — L570 Actinic keratosis: Secondary | ICD-10-CM | POA: Diagnosis not present

## 2021-03-27 DIAGNOSIS — L821 Other seborrheic keratosis: Secondary | ICD-10-CM | POA: Diagnosis not present

## 2021-04-03 NOTE — Progress Notes (Signed)
Cardiology Office Note  Date: 04/04/2021   ID: [name], [name] 12/11/1940, MRN [id number]  PCP:  Coral Spikes, DO  Cardiologist:  Rozann Lesches, MD Electrophysiologist:  None   Chief Complaint: 37-monthfollow-up  History of Present Illness: Zachary PRYORis a 80y.o. male with a history of CAD/CABG 2006, abdominal aortic aneurysm, PVD, cerebrovascular disease/TIA/CVA, hypertension, DM2, HLD, history of tobacco abuse in remission, carotid artery disease.  He was last seen by Dr. MDomenic Politeon 10/06/2020.  He had no reported angina on medical therapy.  [facility].  [facility]  Prior Myoview in January 2022 was low risk with EF of 63, evidence of scar mild. schemia in the basal inferior septal/inferior lateral walls.  He was currently taking Plavix, lisinopril, Zocor.  Plan was to follow-up with Dr. DScot Dockvascular to review options for management of asymptomatic AAA with prior measurement of 5.5 cm.   He is here for 658-monthollow-up today.  He denies any recent anginal or exertional symptoms.  No palpitations or arrhythmias, orthostatic symptoms, CVA or TIA-like symptoms.  His blood pressure was elevated initially at 180/100.  He stated [facility] and had not had time to relax.  Recheck in left arm was 150/80.  He states usually his blood pressure is much better at home in the 12409Wo 13119ystolic range.  States he has an upcoming follow-up with Dr. ChDeitra Mayoascular and vein for his aortic aneurysm.  He denies any abdominal pain, back pain, SOB, DOE, PND, orthopnea.  [facility] States he recently was seen by his PCP and had lab work.  States his blood sugar was well controlled.  His most recent hemoglobin A1c was 6%.  Chemistry and CBC were essentially normal.  Lipids with TC of 114, TG 85, HDL 38, LDL 59.  He is  compliant with his medication regimen which includes; amlodipine 2.5 mg daily.  Plavix 75 mg p.o. daily, lisinopril 20 mg daily.  Simvastatin 80 mg daily.  Past Medical History:  Diagnosis Date   Abdominal aortic aneurysm    Adenomatous polyps    CAD (coronary artery disease)    CABG in 2006   Carotid artery disease ([facility])    Degenerative joint disease of spine    Lumbosacral and cervical; ant. discectomy, fusion and plate in [id number] Diverticulosis    ED (erectile dysfunction)    Essential hypertension    Hyperlipidemia    Insomnia    Neuropathy    PAD (peripheral artery disease) ([facility]   Left femoral art. stent; bilateral renal art.stents - 2007   Sinus headache    [facility]   TIA (transient ischemic attack) 2008   Type 2 diabetes mellitus [place]    Past Surgical History:  Procedure Laterality Date   ANTERIOR FUSION CERVICAL SPINE  2005    COLONOSCOPY W/ POLYPECTOMY  2010   CORONARY ARTERY BYPASS GRAFT  2006   LIMA to LAD; SVG to marginal; SVG to L posterolateral; nondoninant RCA    LUMBAR SPINE SURGERY  1985    Discectomy and fusion   MASS EXCISION Left 01/22/2014   Procedure: EXCISION LEFT WRIST MASS;  Surgeon: ChMcarthur RossettiMD;  Location: [facility];  Service: Orthopedics;  Laterality: Left;    Current Outpatient  Medications  Medication Sig Dispense Refill   ALPRAZolam (XANAX) 0.5 MG tablet TAKE 1 TABLET BY MOUTH AT BEDTIME. 30 tablet 0   amLODipine (NORVASC) 2.5 MG tablet Take 1 tablet (2.5 mg total) by mouth daily. 90 tablet 1   blood glucose meter kit and supplies KIT Dispense based on patient and insurance preference. Use up to four times daily to check your blood sugar (ICD-10 code E11.9) 1 each 0   clopidogrel (PLAVIX) 75 MG tablet Take 1 tablet (75 mg total) by mouth daily. 90 tablet 1   lisinopril (ZESTRIL) 20 MG tablet Take 1 tablet (20 mg total) by mouth 2 (two) times daily. 180 tablet 1   Multiple Vitamin (MULTIVITAMIN) tablet Take 1 tablet by mouth  daily.     nortriptyline (PAMELOR) 25 MG capsule TAKE (1) CAPSULE DAILY AT BEDTIME. 90 capsule 1   Omega-3 Fatty Acids (FISH OIL) 1000 MG CAPS Take 1 capsule by mouth daily.     simvastatin (ZOCOR) 80 MG tablet Take 1 tablet (80 mg total) by mouth daily. 90 tablet 1   temazepam (RESTORIL) 30 MG capsule Take 1 tab p.o. qhs 30 capsule 2   No current facility-administered medications for this visit.   Allergies:  Atorvastatin   Social History: The patient  reports that he quit smoking about 17 years ago. His smoking use included cigarettes. He started smoking about 68 years ago. He has a 50.00 pack-year smoking history. He has never used smokeless tobacco. He reports that he does not drink alcohol and does not use drugs.   Family History: The patient's family history includes Colon cancer in his brother; Hypertension in his brother; Pulmonary embolism in his mother; Stroke in his father.   ROS:  Please see the history of present illness. Otherwise, complete review of systems is positive for none.  All other systems are reviewed and negative.   Physical Exam: VS:  BP (!) 180/100   Pulse 83   Ht _0  (1.778 m)   Wt 187 lb (84.8 kg)   SpO2 96%   BMI 26.83 kg/m , BMI Body mass index is 26.83 kg/m.  Wt Readings from Last 3 Encounters:  04/04/21 187 lb (84.8 kg)  03/21/21 185 lb (83.9 kg)  01/17/21 190 lb 6.4 oz (86.4 kg)    General: Patient appears comfortable at rest. Neck: Supple, no elevated JVP or carotid bruits, no thyromegaly. Lungs: Clear to auscultation, nonlabored breathing at rest. Cardiac: Regular rate and rhythm, no S3 or significant systolic murmur, no pericardial rub. Extremities: No pitting edema, distal pulses 2+. Skin: Warm and dry. Musculoskeletal: No kyphosis. Neuropsychiatric: Alert and oriented x3, affect grossly appropriate.  ECG:    Recent Labwork: 01/17/2021: ALT 16; AST 19; BUN 17; Creatinine, Ser 1.02; Hemoglobin 15.2; Platelets 158; Potassium 5.2; Sodium  139     Component Value Date/Time   CHOL 114 01/17/2021 1201   TRIG 85 01/17/2021 1201   HDL 38 (L) 01/17/2021 1201   CHOLHDL 3.0 01/17/2021 1201   CHOLHDL 3.3 02/08/2014 1133   VLDL 33 02/08/2014 1133   LDLCALC 59 01/17/2021 1201    Other Studies Reviewed Today:  CT angio abdomen pelvis 05/30/2020 IMPRESSION: 1. Interval increase in size of bilobed infrarenal abdominal aortic aneurysm with dominant caudal component now measuring 5.5 cm, previously, 4.9 cm and cranial component now measuring 4.8 cm, previously, 4.2 cm. No evidence of abdominal aortic dissection or periaortic stranding however note, aneurysm size of 5.5 cm is associated with an increased risk  of rupture. 2. Preserved patency of the bilateral L3 lumbar arteries as well as an accessory left renal artery which arises superior to the IMA, of potential clinical significance as could conceivably serve as sources of an endoleak in the setting of an endovascular repair. 3. Stable sequela of previous stenting of the dominant bilateral renal arteries without evidence of in-stent stenosis. 4. Suspected hemodynamically significant stenoses involving the origin of the bilateral superficial femoral arteries. Correlation for symptoms of PAD is advised. 5. Stable fusiform aneurysmal dilatation of the imaged ascending thoracic aorta measuring 43 mm in diameter, similar to the 2019 examination. Recommend annual imaging followup by CTA or MRA. This recommendation follows 2010 ACCF/AHA/AATS/ACR/ASA/SCA/SCAI/SIR/STS/SVM Guidelines for the Diagnosis and Management of Patients with Thoracic Aortic Disease. Circulation. 2010; 121: J883-G549. Aortic aneurysm NOS (ICD10-I71.9) 6. Post median sternotomy and CABG with calcifications within the native coronary arteries. Aortic Atherosclerosis (ICD10-I70.0).   NON-VASCULAR.   1. No acute findings within the abdomen or pelvis. 2. Stable atrophy of the left kidney in comparison to the  right, similar to the 2019 examination. 3. Extensive colonic diverticulosis without evidence of superimposed acute diverticulitis.    Lexiscan Myoview 06/02/2020: No diagnostic ST segment changes to indicate ischemia. Small, mild intensity, apical anteroseptal defect that is fixed, also partially reversible basal inferoseptal/inferolateral defect. This is consistent with underlying ischemic heart disease, predominantly scar with mild peri-infarct ischemia in the basal inferoseptal/inferolateral zone. This is a low risk study. Nuclear stress EF: 63%.  Assessment and Plan:   1. CAD in native artery   2. 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 Mixed hyperlipidemia   4. Essential hypertension     1. CAD in native artery Denies any recent anginal or exertional symptoms.  Continue Plavix 75 mg p.o. daily.  Had a recent low risk stress test in January 2022.  2. 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 Deitra Mayo for AAA with recent measurement in January of 5.5 cm.  He has a follow-up with Dr. Doren Custard on May 04, 2021.  He is asymptomatic.  3. Mixed hyperlipidemia Continue simvastatin 80 mg p.o. daily.  Continue omega-3 fatty acids 1000 mg daily.  Recent Lipids with TC of 114, TG 85, HDL 38, LDL 59.   4. Essential hypertension Blood pressure initially elevated on arrival at 180/100.  He had just returned from working out at [employer].  Recheck in left arm was 150/80.  He states his blood pressures at home are usually normal.  States his systolic blood pressures are usually in the 120s to 130.  Continue amlodipine 2.5 mg p.o. daily.  Continue lisinopril 20 mg p.o. daily.  Medication Adjustments/Labs and Tests Ordered: Current medicines are reviewed at length with the patient today.  Concerns regarding medicines are outlined above.   Disposition: Follow-up with Dr. Domenic Polite or APP 6 months  Signed,  Levell July, NP 04/04/2021 5:13 PM    [place] [facility] [place], [place], [place] [address] Phone: [phone]; Fax: [phone]

## 2021-04-04 ENCOUNTER — Ambulatory Visit (INDEPENDENT_AMBULATORY_CARE_PROVIDER_SITE_OTHER): Payer: Medicare Other | Admitting: Family Medicine

## 2021-04-04 ENCOUNTER — Other Ambulatory Visit: Payer: Self-pay

## 2021-04-04 ENCOUNTER — Encounter: Payer: Self-pay | Admitting: Family Medicine

## 2021-04-04 VITALS — BP 180/100 | HR 83 | Ht 70.0 in | Wt 187.0 lb

## 2021-04-04 DIAGNOSIS — I10 Essential (primary) hypertension: Secondary | ICD-10-CM

## 2021-04-04 DIAGNOSIS — I2510 Atherosclerotic heart disease of native coronary artery without angina pectoris: Secondary | ICD-10-CM | POA: Diagnosis not present

## 2021-04-04 DIAGNOSIS — I7142 Juxtarenal abdominal aortic aneurysm, without rupture: Secondary | ICD-10-CM | POA: Diagnosis not present

## 2021-04-04 DIAGNOSIS — I714 Abdominal aortic aneurysm, without rupture, unspecified: Secondary | ICD-10-CM

## 2021-04-04 DIAGNOSIS — E782 Mixed hyperlipidemia: Secondary | ICD-10-CM

## 2021-04-04 NOTE — Patient Instructions (Addendum)

## 2021-04-14 ENCOUNTER — Ambulatory Visit: Payer: Medicare Other | Admitting: Cardiology

## 2021-04-18 ENCOUNTER — Telehealth: Payer: Self-pay | Admitting: Family Medicine

## 2021-04-18 ENCOUNTER — Other Ambulatory Visit: Payer: Self-pay | Admitting: Family Medicine

## 2021-04-18 DIAGNOSIS — F5101 Primary insomnia: Secondary | ICD-10-CM

## 2021-04-18 NOTE — Telephone Encounter (Signed)
Left message for patient to call back and schedule Medicare Annual Wellness Visit (AWV) in office.   If unable to come into the office for AWV,  please offer to do virtually or by telephone.  Last AWV: 08/19/2017  Please schedule at anytime with [facility] Advisor.  40 minute appointment  Any questions, please contact me at [phone]

## 2021-04-20 ENCOUNTER — Other Ambulatory Visit: Payer: Self-pay | Admitting: *Deleted

## 2021-04-20 DIAGNOSIS — I714 Abdominal aortic aneurysm, without rupture, unspecified: Secondary | ICD-10-CM

## 2021-04-28 ENCOUNTER — Ambulatory Visit (HOSPITAL_COMMUNITY): Payer: Medicare Other

## 2021-05-02 DIAGNOSIS — Z20822 Contact with and (suspected) exposure to covid-19: Secondary | ICD-10-CM | POA: Diagnosis not present

## 2021-05-04 ENCOUNTER — Ambulatory Visit: Payer: Medicare Other | Admitting: Vascular Surgery

## 2021-05-06 ENCOUNTER — Ambulatory Visit
Admission: EM | Admit: 2021-05-06 | Discharge: 2021-05-06 | Disposition: A | Discharge status: Home / Self Care | Payer: Medicare Other | Attending: Physician Assistant | Admitting: Physician Assistant

## 2021-05-06 ENCOUNTER — Other Ambulatory Visit: Payer: Self-pay

## 2021-05-06 ENCOUNTER — Encounter: Payer: Self-pay | Admitting: Emergency Medicine

## 2021-05-06 DIAGNOSIS — Z20822 Contact with and (suspected) exposure to covid-19: Secondary | ICD-10-CM | POA: Diagnosis not present

## 2021-05-06 DIAGNOSIS — J069 Acute upper respiratory infection, unspecified: Secondary | ICD-10-CM

## 2021-05-06 MED ORDER — BENZONATATE 100 MG PO CAPS: 100.0000 mg | ORAL_CAPSULE | Freq: Three times a day (TID) | ORAL | 0 refills | Status: DC

## 2021-05-06 NOTE — ED Provider Notes (Signed)
[facility]    CSN: [id number] Arrival date & time: 05/06/21  0854      History   Chief Complaint No chief complaint on file.   HPI [name] is a 80 y.o. male.   Pt complains of sore throat, body aches, and congestion that started yesterday.  He endorses subjective fever and intermittent cough.  He denies shortness of breath, n/v/d.  He has taken tylenol with some relief of body aches.  His wife is sick with similar sx.     Past Medical History:  Diagnosis Date   Abdominal aortic aneurysm    Adenomatous polyps    CAD (coronary artery disease)    CABG in 2006   Carotid artery disease (HCC)    Degenerative joint disease of spine    Lumbosacral and cervical; ant. discectomy, fusion and plate in 7494   Diverticulosis    ED (erectile dysfunction)    Essential hypertension    Hyperlipidemia    Insomnia    Neuropathy    PAD (peripheral artery disease) (HCC)    Left femoral art. stent; bilateral renal art.stents - 2007   Sinus headache    Stroke [facility])    TIA (transient ischemic attack) 2008   Type 2 diabetes mellitus [facility])     Patient Active Problem List   Diagnosis Date Noted   Sensorineural hearing loss (SNHL), bilateral 02/18/2020   Senile purpura ([place]) 10/14/2019   Controlled diabetes mellitus type 2 with complications ([place]) 49/67/5916   Insomnia 01/29/2013   CAD (coronary artery disease)    Cerebrovascular disease    Hypertension    Hyperlipidemia    Tobacco abuse, in remission    Degenerative joint disease of spine    Abdominal aortic aneurysm 01/31/2010   PERIPHERAL VASCULAR DISEASE 01/31/2010    Past Surgical History:  Procedure Laterality Date   ANTERIOR FUSION CERVICAL SPINE  2005    COLONOSCOPY W/ POLYPECTOMY  2010   CORONARY ARTERY BYPASS GRAFT  2006   LIMA to LAD; SVG to marginal; SVG to L posterolateral; nondoninant RCA    LUMBAR SPINE SURGERY  1985    Discectomy and fusion   MASS EXCISION Left 01/22/2014   Procedure:  EXCISION LEFT WRIST MASS;  Surgeon: Mcarthur Rossetti, MD;  Location: [facility];  Service: Orthopedics;  Laterality: Left;       Home Medications    Prior to Admission medications   Medication Sig Start Date End Date Taking? Authorizing Provider  benzonatate (TESSALON) 100 MG capsule Take 1 capsule (100 mg total) by mouth every 8 (eight) hours. 05/06/21  Yes Ward, Lenise Arena, PA-C  ALPRAZolam Duanne Moron) 0.5 MG tablet TAKE 1 TABLET BY MOUTH AT BEDTIME. 03/27/21   Thersa Salt G, DO  amLODipine (NORVASC) 2.5 MG tablet Take 1 tablet (2.5 mg total) by mouth daily. 01/17/21   Elvia Collum M, DO  blood glucose meter kit and supplies KIT Dispense based on patient and insurance preference. Use up to four times daily to check your blood sugar (ICD-10 code E11.9) 12/02/19   Elvia Collum M, DO  clopidogrel (PLAVIX) 75 MG tablet Take 1 tablet (75 mg total) by mouth daily. 01/17/21   Lovena Le, Malena M, DO  lisinopril (ZESTRIL) 20 MG tablet Take 1 tablet (20 mg total) by mouth 2 (two) times daily. 01/17/21   Erven Colla, DO  Multiple Vitamin (MULTIVITAMIN) tablet Take 1 tablet by mouth daily.    [provider]  nortriptyline (PAMELOR) 25 MG capsule TAKE (  1) CAPSULE DAILY AT BEDTIME. 01/17/21   Lovena Le, Malena M, DO  Omega-3 Fatty Acids (FISH OIL) 1000 MG CAPS Take 1 capsule by mouth daily.    [provider]  simvastatin (ZOCOR) 80 MG tablet Take 1 tablet (80 mg total) by mouth daily. 01/17/21   Lovena Le, Malena M, DO  temazepam (RESTORIL) 30 MG capsule TAKE 1 CAPSULE BY MOUTH AT BEDTIME. 04/19/21   Coral Spikes, DO    Family History Family History  Problem Relation Age of Onset   Stroke Father    Pulmonary embolism Mother        Following hip fracture   Colon cancer Brother    Hypertension Brother     Social History Social History   Tobacco Use   Smoking status: Former    Packs/day: 1.00    Years: 50.00    Pack years: 50.00    Types: Cigarettes    Start date: 05/10/1952     Quit date: 10/18/2003    Years since quitting: 17.5   Smokeless tobacco: Never   Tobacco comments:    50 pack years; quit in 2005   Vaping Use   Vaping Use: Never used  Substance Use Topics   Alcohol use: No    Alcohol/week: 0.0 standard drinks   Drug use: No     Allergies   Atorvastatin   Review of Systems Review of Systems  Constitutional:  Negative for chills and fever.  HENT:  Positive for congestion and sore throat. Negative for ear pain.   Eyes:  Negative for pain and visual disturbance.  Respiratory:  Positive for cough. Negative for shortness of breath.   Cardiovascular:  Negative for chest pain and palpitations.  Gastrointestinal:  Negative for abdominal pain and vomiting.  Genitourinary:  Negative for dysuria and hematuria.  Musculoskeletal:  Positive for myalgias. Negative for arthralgias and back pain.  Skin:  Negative for color change and rash.  Neurological:  Negative for seizures and syncope.  All other systems reviewed and are negative.   Physical Exam Triage Vital Signs ED Triage Vitals  Enc Vitals Group     BP 05/06/21 0924 117/73     Pulse Rate 05/06/21 0923 98     Resp 05/06/21 0923 18     Temp 05/06/21 0923 98.9 F (37.2 C)     Temp Source 05/06/21 0923 Oral     SpO2 05/06/21 0923 97 %     Weight --      Height --      Head Circumference --      Peak Flow --      Pain Score 05/06/21 0924 7     Pain Loc --      Pain Edu? --      Excl. in [place]? --    No data found.  Updated Vital Signs BP 117/73 (BP Location: Right Arm)    Pulse 98    Temp 98.9 F (37.2 C) (Oral)    Resp 18    SpO2 97%   Visual Acuity Right Eye Distance:   Left Eye Distance:   Bilateral Distance:    Right Eye Near:   Left Eye Near:    Bilateral Near:     Physical Exam Vitals and nursing note reviewed.  Constitutional:      General: He is not in acute distress.    Appearance: He is well-developed.  HENT:     Head: Normocephalic and atraumatic.  Eyes:      Conjunctiva/sclera:  Conjunctivae normal.  Cardiovascular:     Rate and Rhythm: Normal rate and regular rhythm.     Heart sounds: No murmur heard. Pulmonary:     Effort: Pulmonary effort is normal. No respiratory distress.     Breath sounds: Normal breath sounds.  Abdominal:     Palpations: Abdomen is soft.     Tenderness: There is no abdominal tenderness.  Musculoskeletal:        General: No swelling.     Cervical back: Neck supple.  Skin:    General: Skin is warm and dry.     Capillary Refill: Capillary refill takes less than 2 seconds.  Neurological:     Mental Status: He is alert.  Psychiatric:        Mood and Affect: Mood normal.     UC Treatments / Results  Labs (all labs ordered are listed, but only abnormal results are displayed) Labs Reviewed  COVID-19, FLU A+B NAA    EKG   Radiology No results found.  Procedures Procedures (including critical care time)  Medications Ordered in UC Medications - No data to display  Initial Impression / Assessment and Plan / UC Course  I have reviewed the triage vital signs and the nursing notes.  Pertinent labs & imaging results that were available during my care of the patient were reviewed by me and considered in my medical decision making (see chart for details).     URI, COVID/flu pending.  Pt overall well appearing.  Supportive treatment discussed. Tessalon prescribed.  Return precautions discussed.  Final Clinical Impressions(s) / UC Diagnoses   Final diagnoses:  Exposure to COVID-19 virus  Acute upper respiratory infection     Discharge Instructions      Take tessalon as needed for cough Continue with tylenol or ibuprofen as needed Recommend Flonase and Mucinex Will call with test results Return if symptoms become worse or you develop shortness of breath/difficulty breathing   ED Prescriptions     Medication Sig Dispense Auth. Provider   benzonatate (TESSALON) 100 MG capsule Take 1 capsule (100 mg  total) by mouth every 8 (eight) hours. 21 capsule Ward, Lenise Arena, PA-C      PDMP not reviewed this encounter.   Ward, Lenise Arena, PA-C 05/06/21 [phone]

## 2021-05-06 NOTE — ED Triage Notes (Signed)
Sore throat, body aches, congestion since yesterday.

## 2021-05-06 NOTE — Discharge Instructions (Addendum)
Take tessalon as needed for cough Continue with tylenol or ibuprofen as needed Recommend Flonase and Mucinex Will call with test results Return if symptoms become worse or you develop shortness of breath/difficulty breathing

## 2021-05-07 LAB — COVID-19, FLU A+B NAA
Influenza A, NAA: NOT DETECTED
Influenza B, NAA: NOT DETECTED
SARS-CoV-2, NAA: DETECTED — AB

## 2021-05-09 ENCOUNTER — Telehealth: Payer: Self-pay | Admitting: Family Medicine

## 2021-05-09 ENCOUNTER — Telehealth: Payer: Self-pay | Admitting: Internal Medicine

## 2021-05-09 MED ORDER — MOLNUPIRAVIR EUA 200MG CAPSULE: 4.0000 | ORAL_CAPSULE | Freq: Two times a day (BID) | ORAL | 0 refills | Status: AC

## 2021-05-09 NOTE — Telephone Encounter (Signed)
Pt contacted and verbalized understanding.  

## 2021-05-09 NOTE — Telephone Encounter (Signed)
Pt went to Urgent Care on 05/06/21. Tested for COVID and results came back a few days later-Positive. Pt states UC was going to give him meds but never did. Pt have head congestion and sore throat and feeling terrible. No shortness of breath or trouble breathing at this time. Pt is wondering if we can send in something to [facility] for him. Please advise. Thank you.

## 2021-05-09 NOTE — Telephone Encounter (Signed)
Molnupiravir sent to the pharmacy today on 05/09/21

## 2021-05-20 ENCOUNTER — Other Ambulatory Visit: Payer: Self-pay | Admitting: Family Medicine

## 2021-05-20 DIAGNOSIS — F5101 Primary insomnia: Secondary | ICD-10-CM

## 2021-05-22 DIAGNOSIS — M79675 Pain in left toe(s): Secondary | ICD-10-CM | POA: Diagnosis not present

## 2021-05-22 DIAGNOSIS — B351 Tinea unguium: Secondary | ICD-10-CM | POA: Diagnosis not present

## 2021-05-22 DIAGNOSIS — I739 Peripheral vascular disease, unspecified: Secondary | ICD-10-CM | POA: Diagnosis not present

## 2021-05-22 DIAGNOSIS — M79674 Pain in right toe(s): Secondary | ICD-10-CM | POA: Diagnosis not present

## 2021-05-23 ENCOUNTER — Other Ambulatory Visit: Payer: Self-pay

## 2021-05-23 DIAGNOSIS — I714 Abdominal aortic aneurysm, without rupture, unspecified: Secondary | ICD-10-CM

## 2021-05-30 ENCOUNTER — Telehealth: Payer: Self-pay

## 2021-05-30 NOTE — Telephone Encounter (Signed)
Pt did not show up for appointment today. Called pt to try and do AWV over the phone but patient declined. Pt states, "I am just getting over Covid and I don't feel like doing it today." Offered to r/s appointment but pt asks if he can be called back towards the end of February to be scheduled. Thank you.

## 2021-05-31 DIAGNOSIS — C44629 Squamous cell carcinoma of skin of left upper limb, including shoulder: Secondary | ICD-10-CM | POA: Diagnosis not present

## 2021-05-31 DIAGNOSIS — L570 Actinic keratosis: Secondary | ICD-10-CM | POA: Diagnosis not present

## 2021-05-31 DIAGNOSIS — D0462 Carcinoma in situ of skin of left upper limb, including shoulder: Secondary | ICD-10-CM | POA: Diagnosis not present

## 2021-06-02 DIAGNOSIS — Z20822 Contact with and (suspected) exposure to covid-19: Secondary | ICD-10-CM | POA: Diagnosis not present

## 2021-06-08 ENCOUNTER — Ambulatory Visit: Payer: Medicare Other | Admitting: Vascular Surgery

## 2021-06-14 DIAGNOSIS — Z20828 Contact with and (suspected) exposure to other viral communicable diseases: Secondary | ICD-10-CM | POA: Diagnosis not present

## 2021-06-15 ENCOUNTER — Ambulatory Visit: Payer: Medicare Other | Admitting: Vascular Surgery

## 2021-06-15 DIAGNOSIS — Z20828 Contact with and (suspected) exposure to other viral communicable diseases: Secondary | ICD-10-CM | POA: Diagnosis not present

## 2021-06-15 DIAGNOSIS — Z1152 Encounter for screening for COVID-19: Secondary | ICD-10-CM | POA: Diagnosis not present

## 2021-06-15 DIAGNOSIS — C44629 Squamous cell carcinoma of skin of left upper limb, including shoulder: Secondary | ICD-10-CM | POA: Diagnosis not present

## 2021-06-19 ENCOUNTER — Telehealth: Payer: Self-pay | Admitting: Family Medicine

## 2021-06-19 NOTE — Telephone Encounter (Signed)
Left message for patient to call back and schedule Medicare Annual Wellness Visit (AWV) in office.   If unable to come into the office for AWV,  please offer to do virtually or by telephone.  Last AWV:  : 08/19/2017  Please schedule at anytime with [facility].  40 minute appointment  Any questions, please contact me at [phone]

## 2021-06-23 ENCOUNTER — Other Ambulatory Visit: Payer: Self-pay | Admitting: Family Medicine

## 2021-06-23 DIAGNOSIS — F5101 Primary insomnia: Secondary | ICD-10-CM

## 2021-06-26 ENCOUNTER — Other Ambulatory Visit: Payer: Self-pay | Admitting: Vascular Surgery

## 2021-06-26 DIAGNOSIS — I714 Abdominal aortic aneurysm, without rupture, unspecified: Secondary | ICD-10-CM | POA: Diagnosis not present

## 2021-06-27 LAB — BUN+CREAT
BUN/Creatinine Ratio: 18 (ref 10–24)
BUN: 23 mg/dL (ref 8–27)
Creatinine, Ser: 1.25 mg/dL (ref 0.76–1.27)
eGFR: 57 mL/min/{1.73_m2} — ABNORMAL LOW (ref >59)

## 2021-06-30 ENCOUNTER — Other Ambulatory Visit: Payer: Self-pay

## 2021-06-30 ENCOUNTER — Ambulatory Visit (HOSPITAL_COMMUNITY)
Admission: RE | Admit: 2021-06-30 | Discharge: 2021-06-30 | Disposition: A | Discharge status: Home / Self Care | Payer: Medicare Other | Source: Ambulatory Visit | Attending: Vascular Surgery | Admitting: Vascular Surgery

## 2021-06-30 DIAGNOSIS — I714 Abdominal aortic aneurysm, without rupture, unspecified: Secondary | ICD-10-CM

## 2021-06-30 DIAGNOSIS — I700 Atherosclerosis of aorta: Secondary | ICD-10-CM | POA: Diagnosis not present

## 2021-06-30 MED ORDER — IOHEXOL 350 MG/ML SOLN
100.0000 mL | Freq: Once | INTRAVENOUS | Status: AC | PRN
  Administered 2021-06-30: 100 mL via INTRAVENOUS

## 2021-07-04 ENCOUNTER — Other Ambulatory Visit: Payer: Self-pay

## 2021-07-04 ENCOUNTER — Telehealth: Payer: Self-pay

## 2021-07-04 NOTE — Telephone Encounter (Signed)
Noted thank you

## 2021-07-04 NOTE — Telephone Encounter (Signed)
Pt was scheduled for AWV today at 3:40. Unable to reach patient by phone to complete visit. Mjp,lpn

## 2021-07-06 ENCOUNTER — Ambulatory Visit: Payer: Medicare Other | Admitting: Vascular Surgery

## 2021-07-13 ENCOUNTER — Encounter: Payer: Self-pay | Admitting: Vascular Surgery

## 2021-07-13 ENCOUNTER — Other Ambulatory Visit: Payer: Self-pay

## 2021-07-13 ENCOUNTER — Ambulatory Visit (INDEPENDENT_AMBULATORY_CARE_PROVIDER_SITE_OTHER): Payer: Medicare Other | Admitting: Vascular Surgery

## 2021-07-13 VITALS — BP 169/89 | HR 74 | Temp 98.0°F | Resp 18 | Ht 70.0 in | Wt 180.0 lb

## 2021-07-13 DIAGNOSIS — I7142 Juxtarenal abdominal aortic aneurysm, without rupture: Secondary | ICD-10-CM

## 2021-07-13 NOTE — Progress Notes (Signed)
? ? ?REASON FOR VISIT:  ? ?Follow-up of juxtarenal aneurysm. ? ?MEDICAL ISSUES:  ? ?JUXTARENAL ABDOMINAL AORTIC ANEURYSM: This patient's juxtarenal aneurysm is stable in size.  It is 5.6 cm which has not changed significantly compared to 5.5 cm a year ago.  He would be at high risk for open repair and I have reviewed the films with Dr. Trula Slade and he is not a good candidate for a fenestrated graft.  If this were to be considered he would likely need to be referred to [facility].  Of note he had undergone a previous cardiac evaluation and his risk for major cardiac event was felt to be 11%.  However, this was before they were aware that this would require a suprarenal clamp and open repair.  We will try to avoid radiation as best possible so we will see him back in 6 months with an ultrasound.  He is not a smoker.  His blood pressure has been under good control although his pressure was only slightly elevated today.  I will see him in 6 months.  He knows to call sooner if he has problems. ? ? ?HPI:  ? ?[name] is a pleasant 81 y.o. 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  Patient has significant coronary artery disease.  Given his age and other risk factors his risk of perioperative mortality for open repair was 15 to 20%.  The risk of rupture for an aneurysm of this size was 7 %/year.  Of note Dr. Trula Slade reviewed the films and he was not a good candidate for a straightforward fenestrated graft.  I set him up for 52-monthfollow-up study.  He had a CT scan and comes in to discuss these results. ? ?Past Medical History:  ?Diagnosis Date  ? Abdominal aortic aneurysm   ? Adenomatous polyps   ? CAD (coronary artery disease)   ? CABG in 2006  ? Carotid artery disease ([facility]   ? Degenerative joint disease of spine   ? Lumbosacral and cervical; ant. discectomy, fusion and plate in 27564 ? Diverticulosis   ? ED (erectile dysfunction)   ? Essential hypertension   ?  Hyperlipidemia   ? Insomnia   ? Neuropathy   ? PAD (peripheral artery disease) ([facility]   ? Left femoral art. stent; bilateral renal art.stents - 2007  ? Sinus headache   ? Stroke ([facility]   ? TIA (transient ischemic attack) 2008  ? Type 2 diabetes mellitus ([facility]   ? ? ?Family History  ?Problem Relation Age of Onset  ? Stroke Father   ? Pulmonary embolism Mother   ?     Following hip fracture  ? Colon cancer Brother   ? Hypertension Brother   ? ? ?SOCIAL HISTORY: ?Social History  ? ?Tobacco Use  ? Smoking status: Former  ?  Packs/day: 1.00  ?  Years: 50.00  ?  Pack years: 50.00  ?  Types: Cigarettes  ?  Start date: 05/10/1952  ?  Quit date: 10/18/2003  ?  Years since quitting: 17.7  ? Smokeless tobacco: Never  ? Tobacco comments:  ?  50 pack years; quit in 2005   ?Substance Use Topics  ? Alcohol use: No  ?  Alcohol/week: 0.0 standard drinks  ? ? ?Allergies  ?Allergen Reactions  ? Atorvastatin Other (See Comments)  ?  Side effects-muscle aches ?Other reaction(s): Other (See Comments) ?Side effects-muscle aches ?Side effects-muscle aches  ? ? ?  Current Outpatient Medications  ?Medication Sig Dispense Refill  ? ALPRAZolam (XANAX) 0.5 MG tablet TAKE 1 TABLET BY MOUTH AT BEDTIME. 90 tablet 0  ? amLODipine (NORVASC) 2.5 MG tablet Take 1 tablet (2.5 mg total) by mouth daily. 90 tablet 1  ? benzonatate (TESSALON) 100 MG capsule Take 1 capsule (100 mg total) by mouth every 8 (eight) hours. 21 capsule 0  ? blood glucose meter kit and supplies KIT Dispense based on patient and insurance preference. Use up to four times daily to check your blood sugar (ICD-10 code E11.9) 1 each 0  ? clopidogrel (PLAVIX) 75 MG tablet Take 1 tablet (75 mg total) by mouth daily. 90 tablet 1  ? lisinopril (ZESTRIL) 20 MG tablet Take 1 tablet (20 mg total) by mouth 2 (two) times daily. 180 tablet 1  ? Multiple Vitamin (MULTIVITAMIN) tablet Take 1 tablet by mouth daily.    ? nortriptyline (PAMELOR) 25 MG capsule TAKE (1) CAPSULE DAILY AT BEDTIME. 90 capsule 1   ? Omega-3 Fatty Acids (FISH OIL) 1000 MG CAPS Take 1 capsule by mouth daily.    ? simvastatin (ZOCOR) 80 MG tablet Take 1 tablet (80 mg total) by mouth daily. 90 tablet 1  ? temazepam (RESTORIL) 30 MG capsule TAKE 1 CAPSULE BY MOUTH AT BEDTIME. 30 capsule 3  ? ?No current facility-administered medications for this visit.  ? ? ?REVIEW OF SYSTEMS:  ?[X]  denotes positive finding, [ ]  denotes negative finding ?Cardiac  Comments:  ?Chest pain or chest pressure:    ?Shortness of breath upon exertion: x   ?Short of breath when lying flat:    ?Irregular heart rhythm:    ?    ?Vascular    ?Pain in calf, thigh, or hip brought on by ambulation:    ?Pain in feet at night that wakes you up from your sleep:     ?Blood clot in your veins:    ?Leg swelling:     ?    ?Pulmonary    ?Oxygen at home:    ?Productive cough:     ?Wheezing:     ?    ?Neurologic    ?Sudden weakness in arms or legs:     ?Sudden numbness in arms or legs:     ?Sudden onset of difficulty speaking or slurred speech:    ?Temporary loss of vision in one eye:     ?Problems with dizziness:     ?    ?Gastrointestinal    ?Blood in stool:     ?Vomited blood:     ?    ?Genitourinary    ?Burning when urinating:     ?Blood in urine:    ?    ?Psychiatric    ?Major depression:     ?    ?Hematologic    ?Bleeding problems:    ?Problems with blood clotting too easily:    ?    ?Skin    ?Rashes or ulcers:    ?    ?Constitutional    ?Fever or chills:    ? ?PHYSICAL EXAM:  ? ?Vitals:  ? 07/13/21 1450  ?BP: (!) 169/89  ?Pulse: 74  ?Resp: 18  ?Temp: 98 ?F (36.7 ?C)  ?TempSrc: Temporal  ?SpO2: 97%  ?Weight: 180 lb (81.6 kg)  ?Height: 5' 10"  (1.778 m)  ? ? ?GENERAL: The patient is a well-nourished male, in no acute distress. The vital signs are documented above. ?CARDIAC: There is a regular rate and rhythm.  ?VASCULAR: I do  not detect carotid bruits. ?He has palpable femoral pulses. ?I cannot palpate pedal pulses however both feet are warm and well-perfused. ?He has no significant  lower extremity swelling. ?PULMONARY: There is good air exchange bilaterally without wheezing or rales. ?ABDOMEN: Soft and non-tender with normal pitched bowel sounds.  His aneurysm is palpable and nontender. ?MUSCULOSKELETAL: There are no major deformities or cyanosis. ?NEUROLOGIC: No focal weakness or paresthesias are detected. ?SKIN: There are no ulcers or rashes noted. ?PSYCHIATRIC: The patient has a normal affect. ? ?DATA:   ? ?CT ABDOMEN PELVIS: I reviewed the images of his CT abdomen pelvis.  There is been no significant change in the size of his juxtarenal aneurysm.  This is 5.6 cm in maximum diameter. ? ?Deitra Mayo ?[facility] ?Office [phone] ?

## 2021-07-14 ENCOUNTER — Other Ambulatory Visit: Payer: Self-pay | Admitting: *Deleted

## 2021-07-14 DIAGNOSIS — I714 Abdominal aortic aneurysm, without rupture, unspecified: Secondary | ICD-10-CM

## 2021-07-19 DIAGNOSIS — R051 Acute cough: Secondary | ICD-10-CM | POA: Diagnosis not present

## 2021-07-19 DIAGNOSIS — Z20822 Contact with and (suspected) exposure to covid-19: Secondary | ICD-10-CM | POA: Diagnosis not present

## 2021-07-19 DIAGNOSIS — R059 Cough, unspecified: Secondary | ICD-10-CM | POA: Diagnosis not present

## 2021-07-25 DIAGNOSIS — Z20828 Contact with and (suspected) exposure to other viral communicable diseases: Secondary | ICD-10-CM | POA: Diagnosis not present

## 2021-07-25 DIAGNOSIS — Z20822 Contact with and (suspected) exposure to covid-19: Secondary | ICD-10-CM | POA: Diagnosis not present

## 2021-07-29 DIAGNOSIS — Z20822 Contact with and (suspected) exposure to covid-19: Secondary | ICD-10-CM | POA: Diagnosis not present

## 2021-07-31 DIAGNOSIS — M79675 Pain in left toe(s): Secondary | ICD-10-CM | POA: Diagnosis not present

## 2021-07-31 DIAGNOSIS — I739 Peripheral vascular disease, unspecified: Secondary | ICD-10-CM | POA: Diagnosis not present

## 2021-07-31 DIAGNOSIS — M79674 Pain in right toe(s): Secondary | ICD-10-CM | POA: Diagnosis not present

## 2021-07-31 DIAGNOSIS — B351 Tinea unguium: Secondary | ICD-10-CM | POA: Diagnosis not present

## 2021-07-31 DIAGNOSIS — B353 Tinea pedis: Secondary | ICD-10-CM | POA: Diagnosis not present

## 2021-08-01 DIAGNOSIS — Z20822 Contact with and (suspected) exposure to covid-19: Secondary | ICD-10-CM | POA: Diagnosis not present

## 2021-08-15 DIAGNOSIS — Z20822 Contact with and (suspected) exposure to covid-19: Secondary | ICD-10-CM | POA: Diagnosis not present

## 2021-08-23 ENCOUNTER — Other Ambulatory Visit: Payer: Self-pay | Admitting: Family Medicine

## 2021-08-23 DIAGNOSIS — F5101 Primary insomnia: Secondary | ICD-10-CM

## 2021-08-23 DIAGNOSIS — I679 Cerebrovascular disease, unspecified: Secondary | ICD-10-CM

## 2021-08-25 DIAGNOSIS — Z20822 Contact with and (suspected) exposure to covid-19: Secondary | ICD-10-CM | POA: Diagnosis not present

## 2021-08-28 DIAGNOSIS — L570 Actinic keratosis: Secondary | ICD-10-CM | POA: Diagnosis not present

## 2021-08-28 DIAGNOSIS — Z20822 Contact with and (suspected) exposure to covid-19: Secondary | ICD-10-CM | POA: Diagnosis not present

## 2021-09-11 DIAGNOSIS — Z20822 Contact with and (suspected) exposure to covid-19: Secondary | ICD-10-CM | POA: Diagnosis not present

## 2021-09-12 DIAGNOSIS — Z20822 Contact with and (suspected) exposure to covid-19: Secondary | ICD-10-CM | POA: Diagnosis not present

## 2021-09-17 DIAGNOSIS — Z20822 Contact with and (suspected) exposure to covid-19: Secondary | ICD-10-CM | POA: Diagnosis not present

## 2021-09-18 DIAGNOSIS — M79675 Pain in left toe(s): Secondary | ICD-10-CM | POA: Diagnosis not present

## 2021-09-18 DIAGNOSIS — M79674 Pain in right toe(s): Secondary | ICD-10-CM | POA: Diagnosis not present

## 2021-09-18 DIAGNOSIS — B351 Tinea unguium: Secondary | ICD-10-CM | POA: Diagnosis not present

## 2021-09-18 DIAGNOSIS — Z20822 Contact with and (suspected) exposure to covid-19: Secondary | ICD-10-CM | POA: Diagnosis not present

## 2021-09-18 DIAGNOSIS — I739 Peripheral vascular disease, unspecified: Secondary | ICD-10-CM | POA: Diagnosis not present

## 2021-09-19 ENCOUNTER — Ambulatory Visit (INDEPENDENT_AMBULATORY_CARE_PROVIDER_SITE_OTHER): Payer: Medicare Other | Admitting: Family Medicine

## 2021-09-19 VITALS — BP 138/88 | Ht 70.0 in | Wt 179.6 lb

## 2021-09-19 DIAGNOSIS — D692 Other nonthrombocytopenic purpura: Secondary | ICD-10-CM | POA: Diagnosis not present

## 2021-09-19 DIAGNOSIS — I2510 Atherosclerotic heart disease of native coronary artery without angina pectoris: Secondary | ICD-10-CM | POA: Diagnosis not present

## 2021-09-19 DIAGNOSIS — I7142 Juxtarenal abdominal aortic aneurysm, without rupture: Secondary | ICD-10-CM

## 2021-09-19 DIAGNOSIS — I10 Essential (primary) hypertension: Secondary | ICD-10-CM | POA: Diagnosis not present

## 2021-09-19 DIAGNOSIS — J019 Acute sinusitis, unspecified: Secondary | ICD-10-CM

## 2021-09-19 DIAGNOSIS — E785 Hyperlipidemia, unspecified: Secondary | ICD-10-CM | POA: Diagnosis not present

## 2021-09-19 DIAGNOSIS — E118 Type 2 diabetes mellitus with unspecified complications: Secondary | ICD-10-CM | POA: Diagnosis not present

## 2021-09-19 MED ORDER — DOXYCYCLINE HYCLATE 100 MG PO TABS: 100.0000 mg | ORAL_TABLET | Freq: Two times a day (BID) | ORAL | 0 refills | Status: DC

## 2021-09-19 NOTE — Patient Instructions (Signed)
Medication as directed for the sinuses. Take with food. ? ?Labs ordered. You can do them when you like. ? ?If back pain worsens, please let me know. For now, we will just watch. Tylenol 1000 mg 3 times daily as needed. ? ?Take care ? ?Dr. Lacinda Axon  ?

## 2021-09-20 DIAGNOSIS — J329 Chronic sinusitis, unspecified: Secondary | ICD-10-CM | POA: Insufficient documentation

## 2021-09-20 NOTE — Assessment & Plan Note (Signed)
Well-controlled.  Lipid panel ordered.  Continue simvastatin. ?

## 2021-09-20 NOTE — Assessment & Plan Note (Signed)
Stable.  Awaiting A1c. ?

## 2021-09-20 NOTE — Progress Notes (Signed)
? ?Subjective:  ?Patient ID: [name], male    DOB: April 28, 1941  Age: 81 y.o. MRN: [id number] ? ?CC: ?Chief Complaint  ?Patient presents with  ? Hypertension  ?  Follow up ?Patient state she has a bad sinus infection and would like treatment  ? ? ?HPI: ? ?81 year old male with an extensive PMH presents for follow up. ? ?Patient's AAA is being followed closely by vascular. ? ?Patient is currently asymptomatic in regards to coronary artery disease.  No chest pain. ? ?Hypertension is stable on amlodipine and lisinopril. ? ?Lipids have been well controlled.  Last LDL was 59.  Needs repeat labs.  Remains on simvastatin. ? ?Diabetes has been controlled with lifestyle. ? ?Patient reports ongoing sinus congestion and cough as well as postnasal drip for the past few weeks.  Patient believes he is suffering from sinusitis.  No fever. ? ?Patient also reports ongoing thoracic back pain.  This is chronic.  He has known degenerative changes. ? ?Patient Active Problem List  ? Diagnosis Date Noted  ? Sinusitis 09/20/2021  ? Senile purpura ([place]) 10/14/2019  ? Controlled diabetes mellitus type 2 with complications ([place]) 17/49/4496  ? Insomnia 01/29/2013  ? CAD (coronary artery disease)   ? Cerebrovascular disease   ? Hypertension   ? Hyperlipidemia   ? Tobacco abuse, in remission   ? Degenerative joint disease of spine   ? Abdominal aortic aneurysm 01/31/2010  ? PERIPHERAL VASCULAR DISEASE 01/31/2010  ? ? ?Social Hx   ?Social History  ? ?Socioeconomic History  ? Marital status: Married  ?  Spouse name: Not on file  ? Number of children: Not on file  ? Years of education: Not on file  ? Highest education level: Not on file  ?Occupational History  ? Occupation: Retired  ?Tobacco Use  ? Smoking status: Former  ?  Packs/day: 1.00  ?  Years: 50.00  ?  Pack years: 50.00  ?  Types: Cigarettes  ?  Start date: 05/10/1952  ?  Quit date: 10/18/2003  ?  Years since quitting: 17.9  ? Smokeless tobacco: Never  ? Tobacco comments:  ?  50 pack  years; quit in 2005   ?Vaping Use  ? Vaping Use: Never used  ?Substance and Sexual Activity  ? Alcohol use: No  ?  Alcohol/week: 0.0 standard drinks  ? Drug use: No  ? Sexual activity: Not on file  ?Other Topics Concern  ? Not on file  ?Social History Narrative  ? Retired; married; does not get regular exercise.   ? ?Social Determinants of Health  ? ?Financial Resource Strain: Not on file  ?Food Insecurity: Not on file  ?Transportation Needs: Not on file  ?Physical Activity: Not on file  ?Stress: Not on file  ?Social Connections: Not on file  ? ? ?Review of Systems ?Per HPI ? ?Objective:  ?BP 138/88   Ht 5' 10"  (1.778 m)   Wt 179 lb 9.6 oz (81.5 kg)   BMI 25.77 kg/m?  ? ? ?  09/19/2021  ?  2:23 PM 07/13/2021  ?  2:50 PM 05/06/2021  ?  9:24 AM  ?BP/Weight  ?Systolic BP 759 163 846  ?Diastolic BP 88 89 73  ?Wt. (Lbs) 179.6 180   ?BMI 25.77 kg/m2 25.83 kg/m2   ? ? ?Physical Exam ?Vitals and nursing note reviewed.  ?Constitutional:   ?   General: He is not in acute distress. ?   Appearance: Normal appearance.  ?HENT:  ?  Head: Normocephalic and atraumatic.  ?Eyes:  ?   General:     ?   Right eye: No discharge.     ?   Left eye: No discharge.  ?   Conjunctiva/sclera: Conjunctivae normal.  ?Cardiovascular:  ?   Rate and Rhythm: Normal rate and regular rhythm.  ?Pulmonary:  ?   Effort: Pulmonary effort is normal.  ?   Breath sounds: Normal breath sounds.  ?Neurological:  ?   Mental Status: He is alert.  ?Psychiatric:     ?   Mood and Affect: Mood normal.     ?   Behavior: Behavior normal.  ? ? ?Lab Results  ?Component Value Date  ? WBC 7.2 01/17/2021  ? HGB 15.2 01/17/2021  ? HCT 45.1 01/17/2021  ? PLT 158 01/17/2021  ? GLUCOSE 100 (H) 01/17/2021  ? CHOL 114 01/17/2021  ? TRIG 85 01/17/2021  ? HDL 38 (L) 01/17/2021  ? LDLCALC 59 01/17/2021  ? ALT 16 01/17/2021  ? AST 19 01/17/2021  ? NA 139 01/17/2021  ? K 5.2 01/17/2021  ? CL 102 01/17/2021  ? CREATININE 1.25 06/26/2021  ? BUN 23 06/26/2021  ? CO2 26 01/17/2021  ? PSA 0.6  08/16/2014  ? INR 1.1 01/19/2007  ? HGBA1C 6.0 (H) 01/17/2021  ? ? ? ?Assessment & Plan:  ? ?Problem List Items Addressed This Visit   ? ?  ? Cardiovascular and Mediastinum  ? Abdominal aortic aneurysm  ?  Continues close follow-up with vascular surgery.  High risk. ? ?  ?  ? CAD (coronary artery disease)  ?  Stable.  Currently asymptomatic. ? ?  ?  ? Hypertension  ?  BP stable.  Continue amlodipine and lisinopril. ? ?  ?  ? Senile purpura ([place])  ? Relevant Orders  ? CBC  ?  ? Respiratory  ? Sinusitis  ?  Treating with Doxy. ? ?  ?  ? Relevant Medications  ? doxycycline (VIBRA-TABS) 100 MG tablet  ?  ? Endocrine  ? Controlled diabetes mellitus type 2 with complications ([place]) - Primary  ?  Stable.  Awaiting A1c. ? ?  ?  ? Relevant Orders  ? CMP14+EGFR  ? Hemoglobin A1c  ? Microalbumin / creatinine urine ratio  ?  ? Other  ? Hyperlipidemia  ?  Well-controlled.  Lipid panel ordered.  Continue simvastatin. ? ?  ?  ? Relevant Orders  ? Lipid panel  ? ? ?Meds ordered this encounter  ?Medications  ? doxycycline (VIBRA-TABS) 100 MG tablet  ?  Sig: Take 1 tablet (100 mg total) by mouth 2 (two) times daily.  ?  Dispense:  14 tablet  ?  Refill:  0  ? ? ?Follow-up:  No follow-ups on file. ? ?Thersa Salt DO ?[place] ? ?

## 2021-09-20 NOTE — Assessment & Plan Note (Signed)
Stable.  Currently asymptomatic. ?

## 2021-09-20 NOTE — Assessment & Plan Note (Signed)
Continues close follow-up with vascular surgery.  High risk. ?

## 2021-09-20 NOTE — Assessment & Plan Note (Signed)
Treating with Doxy. 

## 2021-09-20 NOTE — Assessment & Plan Note (Signed)
BP stable. Continue amlodipine and lisinopril.  

## 2021-09-21 DIAGNOSIS — E119 Type 2 diabetes mellitus without complications: Secondary | ICD-10-CM | POA: Diagnosis not present

## 2021-09-21 DIAGNOSIS — Z961 Presence of intraocular lens: Secondary | ICD-10-CM | POA: Diagnosis not present

## 2021-09-23 ENCOUNTER — Other Ambulatory Visit: Payer: Self-pay | Admitting: Family Medicine

## 2021-09-23 DIAGNOSIS — F5101 Primary insomnia: Secondary | ICD-10-CM

## 2021-09-26 DIAGNOSIS — E785 Hyperlipidemia, unspecified: Secondary | ICD-10-CM | POA: Diagnosis not present

## 2021-09-26 DIAGNOSIS — D692 Other nonthrombocytopenic purpura: Secondary | ICD-10-CM | POA: Diagnosis not present

## 2021-09-26 DIAGNOSIS — E118 Type 2 diabetes mellitus with unspecified complications: Secondary | ICD-10-CM | POA: Diagnosis not present

## 2021-09-27 LAB — CMP14+EGFR
ALT: 20 IU/L (ref 0–44)
AST: 21 IU/L (ref 0–40)
Albumin/Globulin Ratio: 1.7 (ref 1.2–2.2)
Albumin: 4.3 g/dL (ref 3.6–4.6)
Alkaline Phosphatase: 100 IU/L (ref 44–121)
BUN/Creatinine Ratio: 20 (ref 10–24)
BUN: 21 mg/dL (ref 8–27)
Bilirubin Total: 0.5 mg/dL (ref 0.0–1.2)
CO2: 21 mmol/L (ref 20–29)
Calcium: 9.8 mg/dL (ref 8.6–10.2)
Chloride: 105 mmol/L (ref 96–106)
Creatinine, Ser: 1.06 mg/dL (ref 0.76–1.27)
Globulin, Total: 2.5 g/dL (ref 1.5–4.5)
Glucose: 115 mg/dL — ABNORMAL HIGH (ref 70–99)
Potassium: 4.5 mmol/L (ref 3.5–5.2)
Sodium: 142 mmol/L (ref 134–144)
Total Protein: 6.8 g/dL (ref 6.0–8.5)
eGFR: 70 mL/min/{1.73_m2} (ref >59)

## 2021-09-27 LAB — CBC
Hematocrit: 45.5 % (ref 37.5–51.0)
Hemoglobin: 15.7 g/dL (ref 13.0–17.7)
MCH: 31.8 pg (ref 26.6–33.0)
MCHC: 34.5 g/dL (ref 31.5–35.7)
MCV: 92 fL (ref 79–97)
Platelets: 155 10*3/uL (ref 150–450)
RBC: 4.94 x10E6/uL (ref 4.14–5.80)
RDW: 11.9 % (ref 11.6–15.4)
WBC: 7.7 10*3/uL (ref 3.4–10.8)

## 2021-09-27 LAB — LIPID PANEL
Chol/HDL Ratio: 2.8 ratio (ref 0.0–5.0)
Cholesterol, Total: 109 mg/dL (ref 100–199)
HDL: 39 mg/dL — ABNORMAL LOW (ref >39)
LDL Chol Calc (NIH): 54 mg/dL (ref 0–99)
Triglycerides: 76 mg/dL (ref 0–149)
VLDL Cholesterol Cal: 16 mg/dL (ref 5–40)

## 2021-09-27 LAB — HEMOGLOBIN A1C
Est. average glucose Bld gHb Est-mCnc: 128 mg/dL
Hgb A1c MFr Bld: 6.1 % — ABNORMAL HIGH (ref 4.8–5.6)

## 2021-09-27 LAB — MICROALBUMIN / CREATININE URINE RATIO
Creatinine, Urine: 113.9 mg/dL
Microalb/Creat Ratio: 191 mg/g creat — ABNORMAL HIGH (ref 0–29)
Microalbumin, Urine: 217.3 ug/mL

## 2021-09-28 ENCOUNTER — Other Ambulatory Visit: Payer: Self-pay | Admitting: Family Medicine

## 2021-10-04 DIAGNOSIS — Z20828 Contact with and (suspected) exposure to other viral communicable diseases: Secondary | ICD-10-CM | POA: Diagnosis not present

## 2021-10-10 DIAGNOSIS — Z20828 Contact with and (suspected) exposure to other viral communicable diseases: Secondary | ICD-10-CM | POA: Diagnosis not present

## 2021-10-16 ENCOUNTER — Other Ambulatory Visit: Payer: Self-pay | Admitting: Family Medicine

## 2021-10-16 DIAGNOSIS — E7801 Familial hypercholesterolemia: Secondary | ICD-10-CM

## 2021-10-16 DIAGNOSIS — F5101 Primary insomnia: Secondary | ICD-10-CM

## 2021-10-16 DIAGNOSIS — I679 Cerebrovascular disease, unspecified: Secondary | ICD-10-CM

## 2021-10-16 DIAGNOSIS — I10 Essential (primary) hypertension: Secondary | ICD-10-CM

## 2021-10-25 ENCOUNTER — Other Ambulatory Visit: Payer: Self-pay | Admitting: Family Medicine

## 2021-10-25 DIAGNOSIS — F5101 Primary insomnia: Secondary | ICD-10-CM

## 2021-11-24 ENCOUNTER — Other Ambulatory Visit: Payer: Self-pay | Admitting: Family Medicine

## 2021-11-24 DIAGNOSIS — F5101 Primary insomnia: Secondary | ICD-10-CM

## 2021-11-24 DIAGNOSIS — I679 Cerebrovascular disease, unspecified: Secondary | ICD-10-CM

## 2021-12-04 DIAGNOSIS — E1151 Type 2 diabetes mellitus with diabetic peripheral angiopathy without gangrene: Secondary | ICD-10-CM | POA: Diagnosis not present

## 2021-12-04 DIAGNOSIS — B351 Tinea unguium: Secondary | ICD-10-CM | POA: Diagnosis not present

## 2021-12-06 DIAGNOSIS — L570 Actinic keratosis: Secondary | ICD-10-CM | POA: Diagnosis not present

## 2021-12-06 DIAGNOSIS — Z1283 Encounter for screening for malignant neoplasm of skin: Secondary | ICD-10-CM | POA: Diagnosis not present

## 2021-12-06 DIAGNOSIS — Z85828 Personal history of other malignant neoplasm of skin: Secondary | ICD-10-CM | POA: Diagnosis not present

## 2022-01-14 ENCOUNTER — Other Ambulatory Visit: Payer: Self-pay | Admitting: Family Medicine

## 2022-01-14 DIAGNOSIS — E7801 Familial hypercholesterolemia: Secondary | ICD-10-CM

## 2022-01-14 DIAGNOSIS — F5101 Primary insomnia: Secondary | ICD-10-CM

## 2022-01-14 DIAGNOSIS — I10 Essential (primary) hypertension: Secondary | ICD-10-CM

## 2022-01-26 ENCOUNTER — Other Ambulatory Visit: Payer: Self-pay | Admitting: Family Medicine

## 2022-01-26 DIAGNOSIS — F5101 Primary insomnia: Secondary | ICD-10-CM

## 2022-02-19 DIAGNOSIS — I739 Peripheral vascular disease, unspecified: Secondary | ICD-10-CM | POA: Diagnosis not present

## 2022-02-19 DIAGNOSIS — M79674 Pain in right toe(s): Secondary | ICD-10-CM | POA: Diagnosis not present

## 2022-02-19 DIAGNOSIS — B351 Tinea unguium: Secondary | ICD-10-CM | POA: Diagnosis not present

## 2022-02-19 DIAGNOSIS — M79675 Pain in left toe(s): Secondary | ICD-10-CM | POA: Diagnosis not present

## 2022-02-27 DIAGNOSIS — Z85828 Personal history of other malignant neoplasm of skin: Secondary | ICD-10-CM | POA: Diagnosis not present

## 2022-02-27 DIAGNOSIS — L570 Actinic keratosis: Secondary | ICD-10-CM | POA: Diagnosis not present

## 2022-02-27 DIAGNOSIS — B354 Tinea corporis: Secondary | ICD-10-CM | POA: Diagnosis not present

## 2022-02-27 DIAGNOSIS — B359 Dermatophytosis, unspecified: Secondary | ICD-10-CM | POA: Diagnosis not present

## 2022-02-27 DIAGNOSIS — D485 Neoplasm of uncertain behavior of skin: Secondary | ICD-10-CM | POA: Diagnosis not present

## 2022-04-10 ENCOUNTER — Other Ambulatory Visit: Payer: Self-pay | Admitting: Family Medicine

## 2022-04-10 DIAGNOSIS — F5101 Primary insomnia: Secondary | ICD-10-CM

## 2022-04-10 DIAGNOSIS — E7801 Familial hypercholesterolemia: Secondary | ICD-10-CM

## 2022-04-10 DIAGNOSIS — I10 Essential (primary) hypertension: Secondary | ICD-10-CM

## 2022-04-16 ENCOUNTER — Telehealth: Payer: Self-pay | Admitting: Family Medicine

## 2022-04-16 DIAGNOSIS — I10 Essential (primary) hypertension: Secondary | ICD-10-CM

## 2022-04-16 DIAGNOSIS — E118 Type 2 diabetes mellitus with unspecified complications: Secondary | ICD-10-CM

## 2022-04-16 DIAGNOSIS — E785 Hyperlipidemia, unspecified: Secondary | ICD-10-CM

## 2022-04-16 NOTE — Telephone Encounter (Signed)
Patient has appointment on 12/11 and wanting labs done for his 6 month follow up

## 2022-04-16 NOTE — Telephone Encounter (Signed)
Last labs completed 09/26/21-Urine ACR, Lipid, A1C, CMP14 and CBC. Please advise. Thank you

## 2022-04-17 NOTE — Telephone Encounter (Signed)
Lab orders placed and pt is aware 

## 2022-04-18 DIAGNOSIS — E785 Hyperlipidemia, unspecified: Secondary | ICD-10-CM | POA: Diagnosis not present

## 2022-04-18 DIAGNOSIS — I10 Essential (primary) hypertension: Secondary | ICD-10-CM | POA: Diagnosis not present

## 2022-04-18 DIAGNOSIS — E118 Type 2 diabetes mellitus with unspecified complications: Secondary | ICD-10-CM | POA: Diagnosis not present

## 2022-04-19 LAB — CMP14+EGFR
ALT: 14 IU/L (ref 0–44)
AST: 18 IU/L (ref 0–40)
Albumin/Globulin Ratio: 1.8 (ref 1.2–2.2)
Albumin: 4.4 g/dL (ref 3.7–4.7)
Alkaline Phosphatase: 103 IU/L (ref 44–121)
BUN/Creatinine Ratio: 16 (ref 10–24)
BUN: 21 mg/dL (ref 8–27)
Bilirubin Total: 0.7 mg/dL (ref 0.0–1.2)
CO2: 22 mmol/L (ref 20–29)
Calcium: 9.8 mg/dL (ref 8.6–10.2)
Chloride: 101 mmol/L (ref 96–106)
Creatinine, Ser: 1.31 mg/dL — ABNORMAL HIGH (ref 0.76–1.27)
Globulin, Total: 2.4 g/dL (ref 1.5–4.5)
Glucose: 104 mg/dL — ABNORMAL HIGH (ref 70–99)
Potassium: 5.4 mmol/L — ABNORMAL HIGH (ref 3.5–5.2)
Sodium: 140 mmol/L (ref 134–144)
Total Protein: 6.8 g/dL (ref 6.0–8.5)
eGFR: 54 mL/min/{1.73_m2} — ABNORMAL LOW (ref >59)

## 2022-04-19 LAB — CBC WITH DIFFERENTIAL/PLATELET
Basophils Absolute: 0 10*3/uL (ref 0.0–0.2)
Basos: 0 %
EOS (ABSOLUTE): 0.1 10*3/uL (ref 0.0–0.4)
Eos: 1 %
Hematocrit: 43.9 % (ref 37.5–51.0)
Hemoglobin: 15.4 g/dL (ref 13.0–17.7)
Immature Grans (Abs): 0 10*3/uL (ref 0.0–0.1)
Immature Granulocytes: 0 %
Lymphocytes Absolute: 1.4 10*3/uL (ref 0.7–3.1)
Lymphs: 19 %
MCH: 32.4 pg (ref 26.6–33.0)
MCHC: 35.1 g/dL (ref 31.5–35.7)
MCV: 92 fL (ref 79–97)
Monocytes Absolute: 0.6 10*3/uL (ref 0.1–0.9)
Monocytes: 8 %
Neutrophils Absolute: 5.4 10*3/uL (ref 1.4–7.0)
Neutrophils: 72 %
Platelets: 152 10*3/uL (ref 150–450)
RBC: 4.76 x10E6/uL (ref 4.14–5.80)
RDW: 11.9 % (ref 11.6–15.4)
WBC: 7.6 10*3/uL (ref 3.4–10.8)

## 2022-04-19 LAB — LIPID PANEL
Chol/HDL Ratio: 2.5 ratio (ref 0.0–5.0)
Cholesterol, Total: 102 mg/dL (ref 100–199)
HDL: 41 mg/dL (ref >39)
LDL Chol Calc (NIH): 46 mg/dL (ref 0–99)
Triglycerides: 71 mg/dL (ref 0–149)
VLDL Cholesterol Cal: 15 mg/dL (ref 5–40)

## 2022-04-19 LAB — HEMOGLOBIN A1C
Est. average glucose Bld gHb Est-mCnc: 126 mg/dL
Hgb A1c MFr Bld: 6 % — ABNORMAL HIGH (ref 4.8–5.6)

## 2022-04-19 LAB — MICROALBUMIN / CREATININE URINE RATIO
Creatinine, Urine: 159.6 mg/dL
Microalb/Creat Ratio: 633 mg/g creat — ABNORMAL HIGH (ref 0–29)
Microalbumin, Urine: 1009.5 ug/mL

## 2022-04-23 ENCOUNTER — Ambulatory Visit (INDEPENDENT_AMBULATORY_CARE_PROVIDER_SITE_OTHER): Payer: Medicare Other | Admitting: Family Medicine

## 2022-04-23 DIAGNOSIS — I7142 Juxtarenal abdominal aortic aneurysm, without rupture: Secondary | ICD-10-CM

## 2022-04-23 DIAGNOSIS — I10 Essential (primary) hypertension: Secondary | ICD-10-CM

## 2022-04-23 DIAGNOSIS — N1831 Chronic kidney disease, stage 3a: Secondary | ICD-10-CM | POA: Diagnosis not present

## 2022-04-23 DIAGNOSIS — M549 Dorsalgia, unspecified: Secondary | ICD-10-CM | POA: Diagnosis not present

## 2022-04-23 DIAGNOSIS — R809 Proteinuria, unspecified: Secondary | ICD-10-CM

## 2022-04-23 DIAGNOSIS — I2510 Atherosclerotic heart disease of native coronary artery without angina pectoris: Secondary | ICD-10-CM | POA: Diagnosis not present

## 2022-04-23 DIAGNOSIS — E118 Type 2 diabetes mellitus with unspecified complications: Secondary | ICD-10-CM

## 2022-04-23 NOTE — Assessment & Plan Note (Signed)
Stable.  A1c at goal.

## 2022-04-23 NOTE — Assessment & Plan Note (Signed)
Supportive care.  Over-the-counter Tylenol.  Likely MSK in origin.  Could not provide urine sample today.  Lab order sent to [facility] for urine sample to be collected at a later date.

## 2022-04-23 NOTE — Assessment & Plan Note (Signed)
Follows closely with vascular.

## 2022-04-23 NOTE — Patient Instructions (Signed)
Referral to nephrology placed.  Continue your medications.  Follow up in 6 months.  Take care  Dr. Lacinda Axon

## 2022-04-23 NOTE — Assessment & Plan Note (Signed)
Well-controlled.  Continue lisinopril and amlodipine.

## 2022-04-23 NOTE — Assessment & Plan Note (Signed)
Now with marked proteinuria.  Referring to nephrology.

## 2022-04-23 NOTE — Progress Notes (Signed)
Subjective:  Patient ID: [name], male    DOB: 1940/09/29  Age: 81 y.o. MRN: [id number]  CC: Chief Complaint  Patient presents with   Diabetes    Follow up Patient states his sides are bothering him and he wonders if it is his kidneys    HPI:  81 year old male with AAA, CAD, CVD, HTN, DM-2, HLD, Insomnia presents for follow up.   Patient's recent labs reviewed.  Creatinine in February was 1.25 with a GFR of 57.  Most recent creatinine was 1.31 on 12/6.  He had significant proteinuria with a microalbumin creatinine ratio of 633.  Needs referral to nephrology.  His diabetes is under control.  He is on ACE inhibitor as well as amlodipine.  May benefit from SGLT2.  Patient follows closely with vascular surgery regarding his AAA.  He has been stable from a cardiovascular standpoint.  Lipid lipids well-controlled on simvastatin.  Patient reports that he has been having lower thoracic/upper lumbar pain bilaterally for the past 2 weeks.  Denies any hematuria.  Reports that his urine has been "foamy".  This is likely from proteinuria.  No recent fall, trauma, injury.  No radicular symptoms.  No other complaints.   Patient Active Problem List   Diagnosis Date Noted   Proteinuria 04/23/2022   Stage 3a chronic kidney disease ([place]) 04/23/2022   Acute bilateral back pain 04/23/2022   Senile purpura ([place]) 10/14/2019   Controlled diabetes mellitus type 2 with complications ([place]) 79/89/2119   Insomnia 01/29/2013   CAD (coronary artery disease)    Cerebrovascular disease    Hypertension    Hyperlipidemia    Tobacco abuse, in remission    Abdominal aortic aneurysm 01/31/2010   PERIPHERAL VASCULAR DISEASE 01/31/2010    Social Hx   Social History   Socioeconomic History   Marital status: Married    Spouse name: Not on file   Number of children: Not on file   Years of education: Not on file   Highest education level: Not on file  Occupational History   Occupation: Retired   Tobacco Use   Smoking status: Former    Packs/day: 1.00    Years: 50.00    Total pack years: 50.00    Types: Cigarettes    Start date: 05/10/1952    Quit date: 10/18/2003    Years since quitting: 18.5   Smokeless tobacco: Never   Tobacco comments:    50 pack years; quit in 2005   Vaping Use   Vaping Use: Never used  Substance and Sexual Activity   Alcohol use: No    Alcohol/week: 0.0 standard drinks of alcohol   Drug use: No   Sexual activity: Not on file  Other Topics Concern   Not on file  Social History Narrative   Retired; married; does not get regular exercise.    Social Determinants of Health   Financial Resource Strain: Not on file  Food Insecurity: Not on file  Transportation Needs: Not on file  Physical Activity: Not on file  Stress: Not on file  Social Connections: Not on file    Review of Systems Per HPI  Objective:  BP 133/78   Ht '5\' 10"'$  (1.778 m)   Wt 179 lb 6.4 oz (81.4 kg)   BMI 25.74 kg/m      04/23/2022    2:40 PM 09/19/2021    2:23 PM 07/13/2021    2:50 PM  BP/Weight  Systolic BP 417 408 144  Diastolic  BP 78 88 89  Wt. (Lbs) 179.4 179.6 180  BMI 25.74 kg/m2 25.77 kg/m2 25.83 kg/m2    Physical Exam Vitals and nursing note reviewed.  Constitutional:      General: He is not in acute distress.    Appearance: Normal appearance.  HENT:     Head: Normocephalic and atraumatic.  Cardiovascular:     Rate and Rhythm: Normal rate and regular rhythm.  Pulmonary:     Effort: Pulmonary effort is normal.     Breath sounds: Normal breath sounds. No wheezing, rhonchi or rales.  Abdominal:     General: There is no distension.     Palpations: Abdomen is soft.     Comments: Umbilical hernia noted.  Musculoskeletal:     Comments: No significant tenderness of the thoracic or lumbar spine.  Neurological:     Mental Status: He is alert.     Lab Results  Component Value Date   WBC 7.6 04/18/2022   HGB 15.4 04/18/2022   HCT 43.9 04/18/2022   PLT  152 04/18/2022   GLUCOSE 104 (H) 04/18/2022   CHOL 102 04/18/2022   TRIG 71 04/18/2022   HDL 41 04/18/2022   LDLCALC 46 04/18/2022   ALT 14 04/18/2022   AST 18 04/18/2022   NA 140 04/18/2022   K 5.4 (H) 04/18/2022   CL 101 04/18/2022   CREATININE 1.31 (H) 04/18/2022   BUN 21 04/18/2022   CO2 22 04/18/2022   PSA 0.6 08/16/2014   INR 1.1 01/19/2007   HGBA1C 6.0 (H) 04/18/2022     Assessment & Plan:   Problem List Items Addressed This Visit       Cardiovascular and Mediastinum   Abdominal aortic aneurysm    Follows closely with vascular.      Hypertension    Well-controlled.  Continue lisinopril and amlodipine.        Endocrine   Controlled diabetes mellitus type 2 with complications ([facility])    Stable.  A1c at goal.        Genitourinary   Stage 3a chronic kidney disease ([place])    Now with marked proteinuria.  Referring to nephrology.      Relevant Orders   Ambulatory referral to Nephrology     Other   Acute bilateral back pain    Supportive care.  Over-the-counter Tylenol.  Likely MSK in origin.  Could not provide urine sample today.  Lab order sent to [facility] for urine sample to be collected at a later date.      Relevant Orders   Urinalysis, Routine w reflex microscopic   Proteinuria   Relevant Orders   Ambulatory referral to Nephrology    Follow-up:  6 months  [place]

## 2022-04-24 DIAGNOSIS — M549 Dorsalgia, unspecified: Secondary | ICD-10-CM | POA: Diagnosis not present

## 2022-04-25 ENCOUNTER — Ambulatory Visit (INDEPENDENT_AMBULATORY_CARE_PROVIDER_SITE_OTHER): Payer: Medicare Other

## 2022-04-25 DIAGNOSIS — I714 Abdominal aortic aneurysm, without rupture, unspecified: Secondary | ICD-10-CM

## 2022-04-25 LAB — MICROSCOPIC EXAMINATION
Bacteria, UA: NONE SEEN
Casts: NONE SEEN /lpf
Epithelial Cells (non renal): NONE SEEN /hpf (ref 0–10)
RBC, Urine: NONE SEEN /hpf (ref 0–2)
WBC, UA: NONE SEEN /hpf (ref 0–5)

## 2022-04-25 LAB — URINALYSIS, ROUTINE W REFLEX MICROSCOPIC
Bilirubin, UA: NEGATIVE
Glucose, UA: NEGATIVE
Ketones, UA: NEGATIVE
Leukocytes,UA: NEGATIVE
Nitrite, UA: NEGATIVE
RBC, UA: NEGATIVE
Specific Gravity, UA: 1.012 (ref 1.005–1.030)
Urobilinogen, Ur: 0.2 mg/dL (ref 0.2–1.0)
pH, UA: 6 (ref 5.0–7.5)

## 2022-04-26 ENCOUNTER — Ambulatory Visit (HOSPITAL_COMMUNITY): Payer: Medicare Other

## 2022-04-26 ENCOUNTER — Ambulatory Visit (INDEPENDENT_AMBULATORY_CARE_PROVIDER_SITE_OTHER): Payer: Medicare Other | Admitting: Vascular Surgery

## 2022-04-26 ENCOUNTER — Encounter: Payer: Self-pay | Admitting: Vascular Surgery

## 2022-04-26 VITALS — BP 122/77 | HR 83 | Temp 98.4°F | Resp 20 | Ht 70.0 in | Wt 179.0 lb

## 2022-04-26 DIAGNOSIS — I2510 Atherosclerotic heart disease of native coronary artery without angina pectoris: Secondary | ICD-10-CM | POA: Diagnosis not present

## 2022-04-26 DIAGNOSIS — I7142 Juxtarenal abdominal aortic aneurysm, without rupture: Secondary | ICD-10-CM | POA: Diagnosis not present

## 2022-04-26 NOTE — Progress Notes (Signed)
REASON FOR VISIT:   Follow-up of juxtarenal abdominal aortic aneurysm.  MEDICAL ISSUES:   JUXTARENAL ABDOMINAL AORTIC ANEURYSM: His juxtarenal aneurysm is stable in size.  It was 5.9 in December 2021 and was 5.8 yesterday.  He is not a smoker.  His blood pressure has been under good control.  This is a juxtarenal aneurysm and Dr. Trula Slade has reviewed the films.  If he needed to be considered for a fenestrated graft he would have to be referred to [facility].  I think open aneurysm repair would be associated with significant risk given his cardiac history as documented below.  In addition he has had a previous stroke, has a history of chronic kidney disease, and significant coronary artery disease.  He would require a suprarenal clamp.  I have ordered a follow-up ultrasound in 6 months and I will see him back at that time.  If the aneurysm enlarges significantly I think the best option would be referral to [facility] to be considered for a fenestrated graft before considering open repair which would be associated with significant risk.  CLAUDICATION: The patient does note bilateral lower extremity claudication.  He has monophasic Doppler signals in both feet.  I encouraged him to stay as active as possible.  He is not a smoker.  He is on a statin and is on Plavix.  When he comes back in 6 months we will get follow-up ABIs.   HPI:   [name] is a pleasant 81 y.o. male who I last saw on 07/13/2021.  I have been following him with a juxtarenal abdominal aortic aneurysm.  The patient has a history of significant cardiac disease and had undergone previous cardiac evaluation.  They felt that his risk of major cardiac event was 11%.  Based on the morphology of the aneurysm he is not a candidate for a standard endovascular aneurysm repair.  Open repair for this juxtarenal aneurysm would be associated with significant risk.  Dr. Trula Slade reviewed the films and we decided if he needed a fenestrated  graft he would need to be referred to [facility].  He comes in today for 49-monthfollow-up visit.  Had his ultrasound yesterday which shows no significant change in the size of his aneurysm.  Since I saw him last he has had no significant changes in his medical history.  He does complain of bilateral calf claudication.  The symptoms are brought on by ambulation and relieved with rest.  He denies any history of rest pain or nonhealing ulcers.  He has had no recent chest pain or chest pressure.  His kidney function reportedly has deteriorated some and he has been referred to renal.  He has remote history of a stroke about 10 years ago.  Past Medical History:  Diagnosis Date   Abdominal aortic aneurysm ([facility]    Adenomatous polyps    CAD (coronary artery disease)    CABG in 2006   Carotid artery disease (HCC)    Degenerative joint disease of spine    Lumbosacral and cervical; ant. discectomy, fusion and plate in 25366  Diverticulosis    ED (erectile dysfunction)    Essential hypertension    Hyperlipidemia    Insomnia    Neuropathy    PAD ([place]ipheral artery disease) ([facility]    Left femoral art. stent; bilateral renal art.stents - 2007   Sinus headache    Stroke ([facility]    TIA (transient ischemic attack) 2008   Type 2 diabetes  mellitus ([place])     Family History  Problem Relation Age of Onset   Stroke Father    Pulmonary embolism Mother        Following hip fracture   Colon cancer Brother    Hypertension Brother     SOCIAL HISTORY: Social History   Tobacco Use   Smoking status: Former    Packs/day: 1.00    Years: 50.00    Total pack years: 50.00    Types: Cigarettes    Start date: 05/10/1952    Quit date: 10/18/2003    Years since quitting: 18.5   Smokeless tobacco: Never   Tobacco comments:    50 pack years; quit in 2005   Substance Use Topics   Alcohol use: No    Alcohol/week: 0.0 standard drinks of alcohol    Allergies  Allergen Reactions   Atorvastatin Other (See  Comments)    Side effects-muscle aches Other reaction(s): Other (See Comments) Side effects-muscle aches Side effects-muscle aches    Current Outpatient Medications  Medication Sig Dispense Refill   ALPRAZolam (XANAX) 0.5 MG tablet TAKE 1 TABLET BY MOUTH AT BEDTIME. 30 tablet 3   amLODipine (NORVASC) 2.5 MG tablet TAKE 1 TABLET ONCE DAILY. 90 tablet 0   blood glucose meter kit and supplies KIT Dispense based on patient and insurance preference. Use up to four times daily to check your blood sugar (ICD-10 code E11.9) 1 each 0   clopidogrel (PLAVIX) 75 MG tablet TAKE 1 TABLET BY MOUTH ONCE DAILY. 90 tablet 1   doxycycline (VIBRA-TABS) 100 MG tablet Take 1 tablet (100 mg total) by mouth 2 (two) times daily. 14 tablet 0   lisinopril (ZESTRIL) 20 MG tablet TAKE (1) TABLET TWICE DAILY. 180 tablet 0   Multiple Vitamin (MULTIVITAMIN) tablet Take 1 tablet by mouth daily.     nortriptyline (PAMELOR) 25 MG capsule TAKE 1 CAPSULE BY MOUTH AT BEDTIME. 90 capsule 0   Omega-3 Fatty Acids (FISH OIL) 1000 MG CAPS Take 1 capsule by mouth daily.     simvastatin (ZOCOR) 80 MG tablet TAKE 1 TABLET ONCE DAILY. 90 tablet 0   temazepam (RESTORIL) 30 MG capsule TAKE 1 CAPSULE BY MOUTH AT BEDTIME. 30 capsule 3   No current facility-administered medications for this visit.    REVIEW OF SYSTEMS:  _0  denotes positive finding, _1  denotes negative finding Cardiac  Comments:  Chest pain or chest pressure:    Shortness of breath upon exertion:    Short of breath when lying flat:    Irregular heart rhythm:        Vascular    Pain in calf, thigh, or hip brought on by ambulation: x   Pain in feet at night that wakes you up from your sleep:     Blood clot in your veins:    Leg swelling:         Pulmonary    Oxygen at home:    Productive cough:     Wheezing:         Neurologic    Sudden weakness in arms or legs:     Sudden numbness in arms or legs:     Sudden onset of difficulty speaking or slurred speech:     Temporary loss of vision in one eye:     Problems with dizziness:         Gastrointestinal    Blood in stool:     Vomited blood:  Genitourinary    Burning when urinating:     Blood in urine:        Psychiatric    Major depression:         Hematologic    Bleeding problems:    Problems with blood clotting too easily:        Skin    Rashes or ulcers:        Constitutional    Fever or chills:     PHYSICAL EXAM:   Vitals:   04/26/22 0901  BP: 122/77  Pulse: 83  Resp: 20  Temp: 98.4 F (36.9 C)  SpO2: 98%  Weight: 179 lb (81.2 kg)  Height: _0  (1.778 m)    GENERAL: The patient is a well-nourished male, in no acute distress. The vital signs are documented above. CARDIAC: There is a regular rate and rhythm.  VASCULAR: I do not detect carotid bruits. He has palpable femoral pulses.  His femoral arteries are heavily calcified. I cannot palpate pedal pulses.  He has a monophasic dorsalis pedis and posterior tibial signal bilaterally. He has no significant lower extremity swelling. PULMONARY: There is good air exchange bilaterally without wheezing or rales. ABDOMEN: Soft and non-tender with normal pitched bowel sounds.  His aneurysm is palpable and nontender. MUSCULOSKELETAL: There are no major deformities or cyanosis. NEUROLOGIC: No focal weakness or paresthesias are detected. SKIN: There are no ulcers or rashes noted. PSYCHIATRIC: The patient has a normal affect.  DATA:    DUPLEX ABDOMINAL AORTA: I have independently interpreted his duplex of the abdominal aorta.  The maximum diameter of his aneurysm is 5.8 cm.  In comparing this to the duplex in December 2021 there is been no significant change.  The right common iliac artery measures 1.8 cm in maximum diameter.  The left common iliac artery measures 1.7 cm in maximum diameter.  LABS: His most recent creatinine was 1.3 on 04/18/2022.   Deitra Mayo [facility] [phone]

## 2022-04-28 ENCOUNTER — Other Ambulatory Visit: Payer: Self-pay | Admitting: Family Medicine

## 2022-04-28 DIAGNOSIS — I679 Cerebrovascular disease, unspecified: Secondary | ICD-10-CM

## 2022-04-30 DIAGNOSIS — B351 Tinea unguium: Secondary | ICD-10-CM | POA: Diagnosis not present

## 2022-04-30 DIAGNOSIS — E119 Type 2 diabetes mellitus without complications: Secondary | ICD-10-CM | POA: Diagnosis not present

## 2022-04-30 DIAGNOSIS — I739 Peripheral vascular disease, unspecified: Secondary | ICD-10-CM | POA: Diagnosis not present

## 2022-05-08 ENCOUNTER — Other Ambulatory Visit: Payer: Self-pay

## 2022-05-08 DIAGNOSIS — I7142 Juxtarenal abdominal aortic aneurysm, without rupture: Secondary | ICD-10-CM

## 2022-05-21 DIAGNOSIS — D0461 Carcinoma in situ of skin of right upper limb, including shoulder: Secondary | ICD-10-CM | POA: Diagnosis not present

## 2022-05-21 DIAGNOSIS — D485 Neoplasm of uncertain behavior of skin: Secondary | ICD-10-CM | POA: Diagnosis not present

## 2022-05-21 DIAGNOSIS — L570 Actinic keratosis: Secondary | ICD-10-CM | POA: Diagnosis not present

## 2022-05-28 ENCOUNTER — Other Ambulatory Visit: Payer: Self-pay | Admitting: Family Medicine

## 2022-05-28 DIAGNOSIS — F5101 Primary insomnia: Secondary | ICD-10-CM

## 2022-05-31 DIAGNOSIS — C44622 Squamous cell carcinoma of skin of right upper limb, including shoulder: Secondary | ICD-10-CM | POA: Diagnosis not present

## 2022-06-05 ENCOUNTER — Other Ambulatory Visit (HOSPITAL_COMMUNITY): Payer: Self-pay | Admitting: Nephrology

## 2022-06-05 DIAGNOSIS — R809 Proteinuria, unspecified: Secondary | ICD-10-CM

## 2022-06-05 DIAGNOSIS — N189 Chronic kidney disease, unspecified: Secondary | ICD-10-CM | POA: Diagnosis not present

## 2022-06-05 DIAGNOSIS — I7142 Juxtarenal abdominal aortic aneurysm, without rupture: Secondary | ICD-10-CM | POA: Diagnosis not present

## 2022-06-05 DIAGNOSIS — N1832 Chronic kidney disease, stage 3b: Secondary | ICD-10-CM

## 2022-06-05 DIAGNOSIS — E1122 Type 2 diabetes mellitus with diabetic chronic kidney disease: Secondary | ICD-10-CM | POA: Diagnosis not present

## 2022-06-05 DIAGNOSIS — I2510 Atherosclerotic heart disease of native coronary artery without angina pectoris: Secondary | ICD-10-CM | POA: Diagnosis not present

## 2022-06-05 DIAGNOSIS — E875 Hyperkalemia: Secondary | ICD-10-CM | POA: Diagnosis not present

## 2022-06-05 DIAGNOSIS — N200 Calculus of kidney: Secondary | ICD-10-CM | POA: Diagnosis not present

## 2022-06-05 DIAGNOSIS — N280 Ischemia and infarction of kidney: Secondary | ICD-10-CM | POA: Diagnosis not present

## 2022-06-05 DIAGNOSIS — I739 Peripheral vascular disease, unspecified: Secondary | ICD-10-CM | POA: Diagnosis not present

## 2022-06-05 DIAGNOSIS — Z951 Presence of aortocoronary bypass graft: Secondary | ICD-10-CM | POA: Diagnosis not present

## 2022-06-05 DIAGNOSIS — N1831 Chronic kidney disease, stage 3a: Secondary | ICD-10-CM | POA: Diagnosis not present

## 2022-06-07 DIAGNOSIS — N200 Calculus of kidney: Secondary | ICD-10-CM | POA: Diagnosis not present

## 2022-06-07 DIAGNOSIS — N280 Ischemia and infarction of kidney: Secondary | ICD-10-CM | POA: Diagnosis not present

## 2022-06-07 DIAGNOSIS — D509 Iron deficiency anemia, unspecified: Secondary | ICD-10-CM | POA: Diagnosis not present

## 2022-06-07 DIAGNOSIS — I739 Peripheral vascular disease, unspecified: Secondary | ICD-10-CM | POA: Diagnosis not present

## 2022-06-07 DIAGNOSIS — I7142 Juxtarenal abdominal aortic aneurysm, without rupture: Secondary | ICD-10-CM | POA: Diagnosis not present

## 2022-06-07 DIAGNOSIS — Z79899 Other long term (current) drug therapy: Secondary | ICD-10-CM | POA: Diagnosis not present

## 2022-06-07 DIAGNOSIS — R809 Proteinuria, unspecified: Secondary | ICD-10-CM | POA: Diagnosis not present

## 2022-06-07 DIAGNOSIS — Z131 Encounter for screening for diabetes mellitus: Secondary | ICD-10-CM | POA: Diagnosis not present

## 2022-06-07 DIAGNOSIS — N1831 Chronic kidney disease, stage 3a: Secondary | ICD-10-CM | POA: Diagnosis not present

## 2022-06-07 DIAGNOSIS — E875 Hyperkalemia: Secondary | ICD-10-CM | POA: Diagnosis not present

## 2022-06-07 DIAGNOSIS — I2510 Atherosclerotic heart disease of native coronary artery without angina pectoris: Secondary | ICD-10-CM | POA: Diagnosis not present

## 2022-06-07 DIAGNOSIS — Z951 Presence of aortocoronary bypass graft: Secondary | ICD-10-CM | POA: Diagnosis not present

## 2022-06-11 ENCOUNTER — Ambulatory Visit (HOSPITAL_COMMUNITY)
Admission: RE | Admit: 2022-06-11 | Discharge: 2022-06-11 | Disposition: A | Discharge status: Home / Self Care | Payer: Medicare Other | Source: Ambulatory Visit | Attending: Nephrology | Admitting: Nephrology

## 2022-06-11 DIAGNOSIS — N1832 Chronic kidney disease, stage 3b: Secondary | ICD-10-CM | POA: Diagnosis not present

## 2022-06-11 DIAGNOSIS — R809 Proteinuria, unspecified: Secondary | ICD-10-CM

## 2022-06-11 DIAGNOSIS — N281 Cyst of kidney, acquired: Secondary | ICD-10-CM | POA: Diagnosis not present

## 2022-06-27 ENCOUNTER — Other Ambulatory Visit: Payer: Self-pay | Admitting: Family Medicine

## 2022-06-27 DIAGNOSIS — F5101 Primary insomnia: Secondary | ICD-10-CM

## 2022-07-06 DIAGNOSIS — E1122 Type 2 diabetes mellitus with diabetic chronic kidney disease: Secondary | ICD-10-CM | POA: Diagnosis not present

## 2022-07-06 DIAGNOSIS — N280 Ischemia and infarction of kidney: Secondary | ICD-10-CM | POA: Diagnosis not present

## 2022-07-06 DIAGNOSIS — E559 Vitamin D deficiency, unspecified: Secondary | ICD-10-CM | POA: Diagnosis not present

## 2022-07-06 DIAGNOSIS — N189 Chronic kidney disease, unspecified: Secondary | ICD-10-CM | POA: Diagnosis not present

## 2022-07-06 DIAGNOSIS — E1129 Type 2 diabetes mellitus with other diabetic kidney complication: Secondary | ICD-10-CM | POA: Diagnosis not present

## 2022-07-06 DIAGNOSIS — N281 Cyst of kidney, acquired: Secondary | ICD-10-CM | POA: Diagnosis not present

## 2022-07-06 DIAGNOSIS — I7142 Juxtarenal abdominal aortic aneurysm, without rupture: Secondary | ICD-10-CM | POA: Diagnosis not present

## 2022-07-06 DIAGNOSIS — R809 Proteinuria, unspecified: Secondary | ICD-10-CM | POA: Diagnosis not present

## 2022-07-09 DIAGNOSIS — B351 Tinea unguium: Secondary | ICD-10-CM | POA: Diagnosis not present

## 2022-07-09 DIAGNOSIS — I739 Peripheral vascular disease, unspecified: Secondary | ICD-10-CM | POA: Diagnosis not present

## 2022-07-16 ENCOUNTER — Other Ambulatory Visit: Payer: Self-pay | Admitting: Family Medicine

## 2022-07-16 DIAGNOSIS — E7801 Familial hypercholesterolemia: Secondary | ICD-10-CM

## 2022-07-16 DIAGNOSIS — I10 Essential (primary) hypertension: Secondary | ICD-10-CM

## 2022-07-17 ENCOUNTER — Other Ambulatory Visit: Payer: Self-pay | Admitting: Family Medicine

## 2022-07-17 DIAGNOSIS — F5101 Primary insomnia: Secondary | ICD-10-CM

## 2022-09-24 DIAGNOSIS — Z961 Presence of intraocular lens: Secondary | ICD-10-CM | POA: Diagnosis not present

## 2022-09-24 DIAGNOSIS — E119 Type 2 diabetes mellitus without complications: Secondary | ICD-10-CM | POA: Diagnosis not present

## 2022-09-24 DIAGNOSIS — L600 Ingrowing nail: Secondary | ICD-10-CM | POA: Diagnosis not present

## 2022-09-24 DIAGNOSIS — I739 Peripheral vascular disease, unspecified: Secondary | ICD-10-CM | POA: Diagnosis not present

## 2022-09-24 DIAGNOSIS — B351 Tinea unguium: Secondary | ICD-10-CM | POA: Diagnosis not present

## 2022-09-24 LAB — HM DIABETES EYE EXAM

## 2022-09-26 ENCOUNTER — Ambulatory Visit (INDEPENDENT_AMBULATORY_CARE_PROVIDER_SITE_OTHER): Payer: Medicare Other | Admitting: Family Medicine

## 2022-09-26 VITALS — Temp 97.7°F | Ht 70.0 in | Wt 178.0 lb

## 2022-09-26 DIAGNOSIS — E785 Hyperlipidemia, unspecified: Secondary | ICD-10-CM

## 2022-09-26 DIAGNOSIS — N1831 Chronic kidney disease, stage 3a: Secondary | ICD-10-CM | POA: Diagnosis not present

## 2022-09-26 DIAGNOSIS — M1710 Unilateral primary osteoarthritis, unspecified knee: Secondary | ICD-10-CM | POA: Diagnosis not present

## 2022-09-26 DIAGNOSIS — Z7984 Long term (current) use of oral hypoglycemic drugs: Secondary | ICD-10-CM

## 2022-09-26 DIAGNOSIS — I679 Cerebrovascular disease, unspecified: Secondary | ICD-10-CM

## 2022-09-26 DIAGNOSIS — E118 Type 2 diabetes mellitus with unspecified complications: Secondary | ICD-10-CM

## 2022-09-26 DIAGNOSIS — F5101 Primary insomnia: Secondary | ICD-10-CM

## 2022-09-26 MED ORDER — TEMAZEPAM 30 MG PO CAPS: 30.0000 mg | ORAL_CAPSULE | Freq: Every day | ORAL | 0 refills | Status: DC

## 2022-09-26 MED ORDER — EMPAGLIFLOZIN 10 MG PO TABS: 10.0000 mg | ORAL_TABLET | Freq: Every day | ORAL | 1 refills | Status: DC

## 2022-09-26 MED ORDER — CLOPIDOGREL BISULFATE 75 MG PO TABS: 75.0000 mg | ORAL_TABLET | Freq: Every day | ORAL | 1 refills | Status: DC

## 2022-09-26 NOTE — Patient Instructions (Addendum)
Labs today.  Continue your medications.  Follow up in 6 months. 

## 2022-09-30 NOTE — Assessment & Plan Note (Signed)
Well controlled. Adding Jardiance due to proteinuria. Discussed side effects.

## 2022-09-30 NOTE — Addendum Note (Signed)
Addended by: Tommie Sams on: 09/30/2022 10:54 PM   Modules accepted: Orders

## 2022-09-30 NOTE — Progress Notes (Addendum)
Subjective:  Patient ID: [name], male    DOB: June 29, 1940  Age: 82 y.o. MRN: [id number]  CC: Chief Complaint  Patient presents with   Diabetes    HPI:  82 year old male with the below mentioned medical problems presents for follow up.  DM-2 at goal. However, has significant proteinuria. Follows with Nephrology.  Will discuss addition of Jardiance.  Lipids at goal on Simvastatin.   Patient continues taking both Restoril and Xanax for sleep. He has been doing this for years. Will discuss this today.   Patient reports knee pain. Interested in injections.  Patient Active Problem List   Diagnosis Date Noted   Proteinuria 04/23/2022   Stage 3a chronic kidney disease (HCC) 04/23/2022   Senile purpura (HCC) 10/14/2019   Controlled diabetes mellitus type 2 with complications (HCC) 06/27/2017   Insomnia 01/29/2013   CAD (coronary artery disease)    Cerebrovascular disease    Hypertension    Hyperlipidemia    Tobacco abuse, in remission    Abdominal aortic aneurysm 01/31/2010   PERIPHERAL VASCULAR DISEASE 01/31/2010    Social Hx   Social History   Socioeconomic History   Marital status: Married    Spouse name: Not on file   Number of children: Not on file   Years of education: Not on file   Highest education level: Not on file  Occupational History   Occupation: Retired  Tobacco Use   Smoking status: Former    Packs/day: 1.00    Years: 50.00    Additional pack years: 0.00    Total pack years: 50.00    Types: Cigarettes    Start date: 05/10/1952    Quit date: 10/18/2003    Years since quitting: 18.9   Smokeless tobacco: Never   Tobacco comments:    50 pack years; quit in 2005   Vaping Use   Vaping Use: Never used  Substance and Sexual Activity   Alcohol use: No    Alcohol/week: 0.0 standard drinks of alcohol   Drug use: No   Sexual activity: Not on file  Other Topics Concern   Not on file  Social History Narrative   Retired; married; does not get  regular exercise.    Social Determinants of Health   Financial Resource Strain: Not on file  Food Insecurity: Not on file  Transportation Needs: Not on file  Physical Activity: Not on file  Stress: Not on file  Social Connections: Not on file    Review of Systems Per HPI  Objective:  Temp 97.7 F (36.5 C)   Ht 5\' 10"  (1.778 m)   Wt 178 lb (80.7 kg)   BMI 25.54 kg/m      09/26/2022    1:09 PM 04/26/2022    9:01 AM 04/23/2022    2:40 PM  BP/Weight  Systolic BP  122 409  Diastolic BP  77 78  Wt. (Lbs) 178 179 179.4  BMI 25.54 kg/m2 25.68 kg/m2 25.74 kg/m2    Physical Exam Vitals and nursing note reviewed.  Constitutional:      General: He is not in acute distress.    Appearance: Normal appearance.  HENT:     Head: Normocephalic and atraumatic.  Cardiovascular:     Rate and Rhythm: Normal rate and regular rhythm.  Pulmonary:     Effort: Pulmonary effort is normal.     Breath sounds: Normal breath sounds.  Neurological:     Mental Status: He is alert.  Psychiatric:  Mood and Affect: Mood normal.        Behavior: Behavior normal.     Lab Results  Component Value Date   WBC 7.6 04/18/2022   HGB 15.4 04/18/2022   HCT 43.9 04/18/2022   PLT 152 04/18/2022   GLUCOSE 104 (H) 04/18/2022   CHOL 102 04/18/2022   TRIG 71 04/18/2022   HDL 41 04/18/2022   LDLCALC 46 04/18/2022   ALT 14 04/18/2022   AST 18 04/18/2022   NA 140 04/18/2022   K 5.4 (H) 04/18/2022   CL 101 04/18/2022   CREATININE 1.31 (H) 04/18/2022   BUN 21 04/18/2022   CO2 22 04/18/2022   PSA 0.6 08/16/2014   INR 1.1 01/19/2007   HGBA1C 6.0 (H) 04/18/2022     Assessment & Plan:   Problem List Items Addressed This Visit       Cardiovascular and Mediastinum   Cerebrovascular disease   Relevant Medications   clopidogrel (PLAVIX) 75 MG tablet     Endocrine   Controlled diabetes mellitus type 2 with complications (HCC) - Primary    Well controlled. Adding Jardiance due to  proteinuria. Discussed side effects.      Relevant Medications   empagliflozin (JARDIANCE) 10 MG TABS tablet   Other Relevant Orders   Hemoglobin A1c   Microalbumin / creatinine urine ratio     Genitourinary   Stage 3a chronic kidney disease (HCC)    Continue Lisinopril. Adding Jardiance.       Relevant Orders   CBC   CMP14+EGFR     Other   Hyperlipidemia   Relevant Orders   Lipid panel   Insomnia    Advised to try and just use the Restoril.       Relevant Medications   temazepam (RESTORIL) 30 MG capsule   Other Visit Diagnoses     Primary osteoarthritis of knee, unspecified laterality       Relevant Orders   Ambulatory referral to Orthopedic Surgery       Meds ordered this encounter  Medications   clopidogrel (PLAVIX) 75 MG tablet    Sig: Take 1 tablet (75 mg total) by mouth daily.    Dispense:  90 tablet    Refill:  1   DISCONTD: temazepam (RESTORIL) 30 MG capsule    Sig: Take 1 capsule (30 mg total) by mouth at bedtime.    Dispense:  90 capsule    Refill:  0   temazepam (RESTORIL) 30 MG capsule    Sig: Take 1 capsule (30 mg total) by mouth at bedtime.    Dispense:  90 capsule    Refill:  0   empagliflozin (JARDIANCE) 10 MG TABS tablet    Sig: Take 1 tablet (10 mg total) by mouth daily before breakfast.    Dispense:  90 tablet    Refill:  1    Follow-up:  Return in about 6 months (around 03/29/2023).  Everlene Other DO [facility]

## 2022-09-30 NOTE — Assessment & Plan Note (Signed)
Continue Lisinopril. Adding Jardiance.

## 2022-09-30 NOTE — Assessment & Plan Note (Signed)
Advised to try and just use the Restoril.

## 2022-10-02 DIAGNOSIS — N1831 Chronic kidney disease, stage 3a: Secondary | ICD-10-CM | POA: Diagnosis not present

## 2022-10-02 DIAGNOSIS — E785 Hyperlipidemia, unspecified: Secondary | ICD-10-CM | POA: Diagnosis not present

## 2022-10-02 DIAGNOSIS — E118 Type 2 diabetes mellitus with unspecified complications: Secondary | ICD-10-CM | POA: Diagnosis not present

## 2022-10-03 LAB — CMP14+EGFR
ALT: 11 IU/L (ref 0–44)
AST: 13 IU/L (ref 0–40)
Albumin/Globulin Ratio: 1.8 (ref 1.2–2.2)
Albumin: 4.4 g/dL (ref 3.7–4.7)
Alkaline Phosphatase: 99 IU/L (ref 44–121)
BUN/Creatinine Ratio: 22 (ref 10–24)
BUN: 24 mg/dL (ref 8–27)
Bilirubin Total: 0.6 mg/dL (ref 0.0–1.2)
CO2: 23 mmol/L (ref 20–29)
Calcium: 9.6 mg/dL (ref 8.6–10.2)
Chloride: 104 mmol/L (ref 96–106)
Creatinine, Ser: 1.1 mg/dL (ref 0.76–1.27)
Globulin, Total: 2.5 g/dL (ref 1.5–4.5)
Glucose: 111 mg/dL — ABNORMAL HIGH (ref 70–99)
Potassium: 5.3 mmol/L — ABNORMAL HIGH (ref 3.5–5.2)
Sodium: 139 mmol/L (ref 134–144)
Total Protein: 6.9 g/dL (ref 6.0–8.5)
eGFR: 67 mL/min/{1.73_m2} (ref >59)

## 2022-10-03 LAB — CBC
Hematocrit: 45 % (ref 37.5–51.0)
Hemoglobin: 14.9 g/dL (ref 13.0–17.7)
MCH: 30.9 pg (ref 26.6–33.0)
MCHC: 33.1 g/dL (ref 31.5–35.7)
MCV: 93 fL (ref 79–97)
Platelets: 134 10*3/uL — ABNORMAL LOW (ref 150–450)
RBC: 4.82 x10E6/uL (ref 4.14–5.80)
RDW: 11.5 % — ABNORMAL LOW (ref 11.6–15.4)
WBC: 8.6 10*3/uL (ref 3.4–10.8)

## 2022-10-03 LAB — LIPID PANEL
Chol/HDL Ratio: 2.1 ratio (ref 0.0–5.0)
Cholesterol, Total: 100 mg/dL (ref 100–199)
HDL: 47 mg/dL (ref >39)
LDL Chol Calc (NIH): 37 mg/dL (ref 0–99)
Triglycerides: 75 mg/dL (ref 0–149)
VLDL Cholesterol Cal: 16 mg/dL (ref 5–40)

## 2022-10-03 LAB — HEMOGLOBIN A1C
Est. average glucose Bld gHb Est-mCnc: 126 mg/dL
Hgb A1c MFr Bld: 6 % — ABNORMAL HIGH (ref 4.8–5.6)

## 2022-10-03 LAB — MICROALBUMIN / CREATININE URINE RATIO
Creatinine, Urine: 94.6 mg/dL
Microalb/Creat Ratio: 343 mg/g creat — ABNORMAL HIGH (ref 0–29)
Microalbumin, Urine: 324.2 ug/mL

## 2022-10-12 ENCOUNTER — Other Ambulatory Visit: Payer: Self-pay | Admitting: Family Medicine

## 2022-10-12 DIAGNOSIS — I10 Essential (primary) hypertension: Secondary | ICD-10-CM

## 2022-10-12 DIAGNOSIS — E7801 Familial hypercholesterolemia: Secondary | ICD-10-CM

## 2022-10-15 ENCOUNTER — Encounter: Payer: Self-pay | Admitting: Orthopedic Surgery

## 2022-10-15 ENCOUNTER — Ambulatory Visit (INDEPENDENT_AMBULATORY_CARE_PROVIDER_SITE_OTHER): Payer: Medicare Other | Admitting: Orthopedic Surgery

## 2022-10-15 ENCOUNTER — Other Ambulatory Visit (INDEPENDENT_AMBULATORY_CARE_PROVIDER_SITE_OTHER): Payer: Medicare Other

## 2022-10-15 VITALS — BP 152/86 | HR 69 | Ht 69.0 in | Wt 177.0 lb

## 2022-10-15 DIAGNOSIS — G8929 Other chronic pain: Secondary | ICD-10-CM | POA: Diagnosis not present

## 2022-10-15 DIAGNOSIS — M1712 Unilateral primary osteoarthritis, left knee: Secondary | ICD-10-CM

## 2022-10-15 DIAGNOSIS — M25562 Pain in left knee: Secondary | ICD-10-CM | POA: Diagnosis not present

## 2022-10-15 MED ORDER — METHYLPREDNISOLONE ACETATE 40 MG/ML IJ SUSP
40.0000 mg | Freq: Once | INTRAMUSCULAR | Status: AC
  Administered 2022-10-15: 40 mg via INTRA_ARTICULAR

## 2022-10-15 NOTE — Progress Notes (Signed)
Chief Complaint  Patient presents with   Knee Pain    Left for years, had injection couple years ago and it helped until weather "got bad"   Patient Active Problem List   Diagnosis Date Noted   Proteinuria 04/23/2022   Stage 3a chronic kidney disease (HCC) 04/23/2022   Senile purpura (HCC) 10/14/2019   Controlled diabetes mellitus type 2 with complications (HCC) 06/27/2017   Insomnia 01/29/2013   CAD (coronary artery disease)    Cerebrovascular disease    Hypertension    Hyperlipidemia    Tobacco abuse, in remission    Abdominal aortic aneurysm 01/31/2010   PERIPHERAL VASCULAR DISEASE 01/31/2010   82 year old male with the medical problems as listed above was seen by orthopedics in the past and got an injection.  He complains of knee pain when the weather is bad and would like evaluation today for possible treatment options   Left Knee Exam   Muscle Strength  The patient has normal left knee strength.  Tenderness  The patient is experiencing tenderness in the patella.  Range of Motion  Left knee extension: 3.  Left knee flexion: 125.   Tests  Lachman:  Anterior - negative     Drawer:  Anterior - negative     Posterior - negative  Other  Erythema: absent Scars: absent Sensation: normal Pulse: present Swelling: none Effusion: no effusion present    Gait is normal no support needed  Imaging  Diagnosis  Encounter Diagnoses  Name Primary?   Chronic pain of left knee    Primary osteoarthritis of left knee Yes   X-rays as noted above show osteoarthritis left knee moderate and mild arthritis of the right knee  Assessment and plan 82 year old male with multiple medical problems has osteoarthritis left knee symptomatic with weather changes, chronic condition with exacerbation  I think an injection will do well.  We both agreed to injection  Procedure note left knee injection   verbal consent was obtained to inject left knee joint  Timeout was completed to  confirm the site of injection  The medications used were depomedrol 40 mg and 1% lidocaine 3 cc Anesthesia was provided by ethyl chloride and the skin was prepped with alcohol.  After cleaning the skin with alcohol a 20-gauge needle was used to inject the left knee joint. There were no complications. A sterile bandage was applied.

## 2022-10-27 ENCOUNTER — Other Ambulatory Visit: Payer: Self-pay | Admitting: Family Medicine

## 2022-10-27 DIAGNOSIS — F5101 Primary insomnia: Secondary | ICD-10-CM

## 2022-11-27 ENCOUNTER — Encounter: Payer: Medicare Other | Admitting: Family Medicine

## 2022-12-03 DIAGNOSIS — D485 Neoplasm of uncertain behavior of skin: Secondary | ICD-10-CM | POA: Diagnosis not present

## 2022-12-03 DIAGNOSIS — Z85828 Personal history of other malignant neoplasm of skin: Secondary | ICD-10-CM | POA: Diagnosis not present

## 2022-12-03 DIAGNOSIS — Z1283 Encounter for screening for malignant neoplasm of skin: Secondary | ICD-10-CM | POA: Diagnosis not present

## 2022-12-03 DIAGNOSIS — C44311 Basal cell carcinoma of skin of nose: Secondary | ICD-10-CM | POA: Diagnosis not present

## 2022-12-03 DIAGNOSIS — L570 Actinic keratosis: Secondary | ICD-10-CM | POA: Diagnosis not present

## 2022-12-10 DIAGNOSIS — I739 Peripheral vascular disease, unspecified: Secondary | ICD-10-CM | POA: Diagnosis not present

## 2022-12-10 DIAGNOSIS — M79674 Pain in right toe(s): Secondary | ICD-10-CM | POA: Diagnosis not present

## 2022-12-10 DIAGNOSIS — M79675 Pain in left toe(s): Secondary | ICD-10-CM | POA: Diagnosis not present

## 2022-12-10 DIAGNOSIS — B351 Tinea unguium: Secondary | ICD-10-CM | POA: Diagnosis not present

## 2022-12-24 ENCOUNTER — Other Ambulatory Visit: Payer: Self-pay | Admitting: Family Medicine

## 2022-12-24 DIAGNOSIS — F5101 Primary insomnia: Secondary | ICD-10-CM

## 2022-12-27 ENCOUNTER — Ambulatory Visit
Admission: EM | Admit: 2022-12-27 | Discharge: 2022-12-27 | Disposition: A | Discharge status: Home / Self Care | Payer: Medicare Other | Attending: Family Medicine | Admitting: Family Medicine

## 2022-12-27 DIAGNOSIS — S39012A Strain of muscle, fascia and tendon of lower back, initial encounter: Secondary | ICD-10-CM

## 2022-12-27 MED ORDER — PREDNISONE 20 MG PO TABS: 40.0000 mg | ORAL_TABLET | Freq: Every day | ORAL | 0 refills | Status: DC

## 2022-12-27 MED ORDER — TIZANIDINE HCL 2 MG PO CAPS: 2.0000 mg | ORAL_CAPSULE | Freq: Three times a day (TID) | ORAL | 0 refills | Status: DC | PRN

## 2022-12-27 NOTE — ED Triage Notes (Signed)
Pt reports he has had low back pain x 1 week.   Mowed his lawn then had pain in back after that

## 2022-12-27 NOTE — ED Provider Notes (Signed)
[facility]    CSN: [id number] Arrival date & time: 12/27/22  1325      History   Chief Complaint No chief complaint on file.   HPI [name] is a 82 y.o. male.   Presenting today with about a week of midline and left-sided low back pain, stiffness, soreness after mowing his lawn.  Denies radiation of pain down legs, numbness, tingling, weakness, bowel or bladder incontinence, saddle anesthesias.  Trying heat, back brace with minimal relief.    Past Medical History:  Diagnosis Date   Abdominal aortic aneurysm (HCC)    Adenomatous polyps    CAD (coronary artery disease)    CABG in 2006   Carotid artery disease (HCC)    Degenerative joint disease of spine    Lumbosacral and cervical; ant. discectomy, fusion and plate in 2952   Diverticulosis    ED (erectile dysfunction)    Essential hypertension    Hyperlipidemia    Insomnia    Neuropathy    PAD (peripheral artery disease) (HCC)    Left femoral art. stent; bilateral renal art.stents - 2007   Sinus headache    Stroke [facility])    TIA (transient ischemic attack) 2008   Type 2 diabetes mellitus (HCC)     Patient Active Problem List   Diagnosis Date Noted   Proteinuria 04/23/2022   Stage 3a chronic kidney disease (HCC) 04/23/2022   Senile purpura (HCC) 10/14/2019   Controlled diabetes mellitus type 2 with complications (HCC) 06/27/2017   Insomnia 01/29/2013   CAD (coronary artery disease)    Cerebrovascular disease    Hypertension    Hyperlipidemia    Tobacco abuse, in remission    Abdominal aortic aneurysm 01/31/2010   PERIPHERAL VASCULAR DISEASE 01/31/2010    Past Surgical History:  Procedure Laterality Date   ANTERIOR FUSION CERVICAL SPINE  2005    COLONOSCOPY W/ POLYPECTOMY  2010   CORONARY ARTERY BYPASS GRAFT  2006   LIMA to LAD; SVG to marginal; SVG to L posterolateral; nondoninant RCA    LUMBAR SPINE SURGERY  1985    Discectomy and fusion   MASS EXCISION Left 01/22/2014   Procedure:  EXCISION LEFT WRIST MASS;  Surgeon: Kathryne Hitch, MD;  Location: [facility];  Service: Orthopedics;  Laterality: Left;       Home Medications    Prior to Admission medications   Medication Sig Start Date End Date Taking? Authorizing Provider  predniSONE (DELTASONE) 20 MG tablet Take 2 tablets (40 mg total) by mouth daily with breakfast. 12/27/22  Yes Particia Nearing, PA-C  tizanidine (ZANAFLEX) 2 MG capsule Take 1 capsule (2 mg total) by mouth 3 (three) times daily as needed for muscle spasms. 12/27/22  Yes Particia Nearing, PA-C  ALPRAZolam Prudy Feeler) 0.5 MG tablet TAKE 1 TABLET BY MOUTH AT BEDTIME AS NEEDED. 11/02/22   Everlene Other G, DO  amLODipine (NORVASC) 2.5 MG tablet TAKE 1 TABLET ONCE DAILY. 10/12/22   Tommie Sams, DO  blood glucose meter kit and supplies KIT Dispense based on patient and insurance preference. Use up to four times daily to check your blood sugar (ICD-10 code E11.9) 12/02/19   Laroy Apple M, DO  clopidogrel (PLAVIX) 75 MG tablet Take 1 tablet (75 mg total) by mouth daily. 09/26/22   Tommie Sams, DO  empagliflozin (JARDIANCE) 10 MG TABS tablet Take 1 tablet (10 mg total) by mouth daily before breakfast. 09/26/22   Tommie Sams, DO  lisinopril (ZESTRIL)  20 MG tablet TAKE (1) TABLET TWICE DAILY. 10/12/22   Tommie Sams, DO  Multiple Vitamin (MULTIVITAMIN) tablet Take 1 tablet by mouth daily.    [provider]  Omega-3 Fatty Acids (FISH OIL) 1000 MG CAPS Take 1 capsule by mouth daily.    [provider]  simvastatin (ZOCOR) 80 MG tablet TAKE 1 TABLET ONCE DAILY. 10/12/22   Everlene Other G, DO  temazepam (RESTORIL) 30 MG capsule take 1 capsule (30 MILLIGRAM total) by mouth at bedtime. 12/24/22   Tommie Sams, DO    Family History Family History  Problem Relation Age of Onset   Stroke Father    Pulmonary embolism Mother        Following hip fracture   Colon cancer Brother    Hypertension Brother     Social History Social History    Tobacco Use   Smoking status: Former    Current packs/day: 0.00    Average packs/day: 1 pack/day for 51.4 years (51.4 ttl pk-yrs)    Types: Cigarettes    Start date: 05/10/1952    Quit date: 10/18/2003    Years since quitting: 19.2   Smokeless tobacco: Never   Tobacco comments:    50 pack years; quit in 2005   Vaping Use   Vaping status: Never Used  Substance Use Topics   Alcohol use: No    Alcohol/week: 0.0 standard drinks of alcohol   Drug use: No     Allergies   Atorvastatin   Review of Systems Review of Systems PER HPI  Physical Exam Triage Vital Signs ED Triage Vitals [12/27/22 1343]  Encounter Vitals Group     BP 134/63     Systolic BP Percentile      Diastolic BP Percentile      Pulse Rate 69     Resp 18     Temp 97.6 F (36.4 C)     Temp Source Oral     SpO2 95 %     Weight      Height      Head Circumference      Peak Flow      Pain Score 10     Pain Loc      Pain Education      Exclude from Growth Chart    No data found.  Updated Vital Signs BP 134/63 (BP Location: Right Arm)   Pulse 69   Temp 97.6 F (36.4 C) (Oral)   Resp 18   SpO2 95%   Visual Acuity Right Eye Distance:   Left Eye Distance:   Bilateral Distance:    Right Eye Near:   Left Eye Near:    Bilateral Near:     Physical Exam Vitals and nursing note reviewed.  Constitutional:      Appearance: Normal appearance.  HENT:     Head: Atraumatic.  Eyes:     Extraocular Movements: Extraocular movements intact.     Conjunctiva/sclera: Conjunctivae normal.  Cardiovascular:     Rate and Rhythm: Normal rate and regular rhythm.  Pulmonary:     Effort: Pulmonary effort is normal.     Breath sounds: Normal breath sounds.  Musculoskeletal:        General: No swelling or deformity. Normal range of motion.     Cervical back: Normal range of motion and neck supple.     Comments: No midline spinal tenderness to palpation diffusely.  Negative straight leg raise bilateral lower  extremities.  Bilateral lumbar paraspinal  muscular tenderness to palpation particular on the left  Skin:    General: Skin is warm and dry.     Findings: No bruising or erythema.  Neurological:     General: No focal deficit present.     Mental Status: He is oriented to person, place, and time.     Motor: No weakness.     Gait: Gait normal.     Comments: Bilateral lower extremities neurovascularly intact  Psychiatric:        Mood and Affect: Mood normal.        Thought Content: Thought content normal.        Judgment: Judgment normal.      UC Treatments / Results  Labs (all labs ordered are listed, but only abnormal results are displayed) Labs Reviewed - No data to display  EKG   Radiology No results found.  Procedures Procedures (including critical care time)  Medications Ordered in UC Medications - No data to display  Initial Impression / Assessment and Plan / UC Course  I have reviewed the triage vital signs and the nursing notes.  Pertinent labs & imaging results that were available during my care of the patient were reviewed by me and considered in my medical decision making (see chart for details).     No red flag findings on exam today, suspect muscular strain.  Treat with prednisone, Zanaflex, stretches, heat, massage.  Return for worsening symptoms.  Final Clinical Impressions(s) / UC Diagnoses   Final diagnoses:  Strain of lumbar region, initial encounter   Discharge Instructions   None    ED Prescriptions     Medication Sig Dispense Auth. Provider   tizanidine (ZANAFLEX) 2 MG capsule Take 1 capsule (2 mg total) by mouth 3 (three) times daily as needed for muscle spasms. 15 capsule Particia Nearing, [place]   predniSONE (DELTASONE) 20 MG tablet Take 2 tablets (40 mg total) by mouth daily with breakfast. 6 tablet Particia Nearing, [place]      PDMP not reviewed this encounter.   Particia Nearing, [place] 12/27/22 1422

## 2023-01-03 ENCOUNTER — Other Ambulatory Visit: Payer: Self-pay | Admitting: Family Medicine

## 2023-01-03 DIAGNOSIS — I10 Essential (primary) hypertension: Secondary | ICD-10-CM

## 2023-01-03 DIAGNOSIS — E7801 Familial hypercholesterolemia: Secondary | ICD-10-CM

## 2023-01-24 DIAGNOSIS — C44311 Basal cell carcinoma of skin of nose: Secondary | ICD-10-CM | POA: Diagnosis not present

## 2023-02-18 DIAGNOSIS — M79674 Pain in right toe(s): Secondary | ICD-10-CM | POA: Diagnosis not present

## 2023-02-18 DIAGNOSIS — I739 Peripheral vascular disease, unspecified: Secondary | ICD-10-CM | POA: Diagnosis not present

## 2023-02-18 DIAGNOSIS — M79675 Pain in left toe(s): Secondary | ICD-10-CM | POA: Diagnosis not present

## 2023-02-18 DIAGNOSIS — B351 Tinea unguium: Secondary | ICD-10-CM | POA: Diagnosis not present

## 2023-03-06 DIAGNOSIS — Z23 Encounter for immunization: Secondary | ICD-10-CM | POA: Diagnosis not present

## 2023-04-01 ENCOUNTER — Ambulatory Visit (INDEPENDENT_AMBULATORY_CARE_PROVIDER_SITE_OTHER): Payer: Medicare Other | Admitting: Family Medicine

## 2023-04-01 VITALS — BP 152/75 | HR 72 | Temp 97.2°F | Ht 69.0 in | Wt 175.0 lb

## 2023-04-01 DIAGNOSIS — E118 Type 2 diabetes mellitus with unspecified complications: Secondary | ICD-10-CM | POA: Diagnosis not present

## 2023-04-01 DIAGNOSIS — F5101 Primary insomnia: Secondary | ICD-10-CM | POA: Diagnosis not present

## 2023-04-01 DIAGNOSIS — I739 Peripheral vascular disease, unspecified: Secondary | ICD-10-CM | POA: Diagnosis not present

## 2023-04-01 DIAGNOSIS — I10 Essential (primary) hypertension: Secondary | ICD-10-CM

## 2023-04-01 DIAGNOSIS — I679 Cerebrovascular disease, unspecified: Secondary | ICD-10-CM | POA: Diagnosis not present

## 2023-04-01 DIAGNOSIS — E785 Hyperlipidemia, unspecified: Secondary | ICD-10-CM

## 2023-04-01 DIAGNOSIS — N1831 Chronic kidney disease, stage 3a: Secondary | ICD-10-CM | POA: Diagnosis not present

## 2023-04-01 MED ORDER — CLOPIDOGREL BISULFATE 75 MG PO TABS
75.0000 mg | ORAL_TABLET | Freq: Every day | ORAL | 3 refills | Status: DC
Start: 2023-04-01 — End: 2023-12-31

## 2023-04-01 MED ORDER — ALPRAZOLAM 0.5 MG PO TABS
0.5000 mg | ORAL_TABLET | Freq: Every evening | ORAL | 5 refills | Status: DC | PRN
Start: 2023-04-01 — End: 2023-10-09

## 2023-04-01 MED ORDER — EMPAGLIFLOZIN 10 MG PO TABS: 10.0000 mg | ORAL_TABLET | Freq: Every day | ORAL | 3 refills | Status: DC

## 2023-04-01 MED ORDER — AMLODIPINE BESYLATE 2.5 MG PO TABS
2.5000 mg | ORAL_TABLET | Freq: Every day | ORAL | 3 refills | Status: DC
Start: 2023-04-01 — End: 2023-12-31

## 2023-04-01 MED ORDER — SIMVASTATIN 80 MG PO TABS
80.0000 mg | ORAL_TABLET | Freq: Every day | ORAL | 3 refills | Status: DC
Start: 2023-04-01 — End: 2023-12-31

## 2023-04-01 MED ORDER — TEMAZEPAM 30 MG PO CAPS
30.0000 mg | ORAL_CAPSULE | Freq: Every evening | ORAL | 1 refills | Status: DC | PRN
Start: 2023-04-01 — End: 2023-10-09

## 2023-04-01 MED ORDER — LISINOPRIL 20 MG PO TABS
20.0000 mg | ORAL_TABLET | Freq: Every day | ORAL | 3 refills | Status: DC
Start: 2023-04-01 — End: 2023-06-11

## 2023-04-01 NOTE — Patient Instructions (Signed)
Labs today.  Medications refilled.  Follow up in 6 months.  Take care  Dr. Adriana Simas

## 2023-04-01 NOTE — Assessment & Plan Note (Signed)
A1c today to assess.  Continue Jardiance.

## 2023-04-01 NOTE — Assessment & Plan Note (Signed)
I have advised to use just the Restoril.

## 2023-04-01 NOTE — Assessment & Plan Note (Signed)
Home BPs at goal.  Continue current medications.

## 2023-04-01 NOTE — Assessment & Plan Note (Signed)
Follows with vascular

## 2023-04-01 NOTE — Assessment & Plan Note (Signed)
Lipid panel today to assess.  Continue statin. 

## 2023-04-01 NOTE — Progress Notes (Signed)
Subjective:  Patient ID: [name], male    DOB: 1940-06-24  Age: 82 y.o. MRN: [id number]  CC:  Follow up   HPI:  82 year old male with an extensive past medical history presents for follow-up.  Patient has been on Restoril and low-dose alprazolam for many years.  I have advised against this.  I have advised him to just use the Restoril.  Needs labs today.  Patient mildly hypertensive here today.  He states that his blood pressure was well-controlled this morning at home.  He states that it was 110/61.  Patient is on lisinopril and amlodipine.  Patient needs reassessment of A1c.  He is on Jardiance regarding CKD and type 2 diabetes.  Lipids have been at goal on simvastatin.  Patient Active Problem List   Diagnosis Date Noted   Proteinuria 04/23/2022   Stage 3a chronic kidney disease (HCC) 04/23/2022   Senile purpura (HCC) 10/14/2019   Controlled diabetes mellitus type 2 with complications (HCC) 06/27/2017   Insomnia 01/29/2013   CAD (coronary artery disease)    Cerebrovascular disease    Hypertension    Hyperlipidemia    Tobacco abuse, in remission    Abdominal aortic aneurysm 01/31/2010   PERIPHERAL VASCULAR DISEASE 01/31/2010    Social Hx   Social History   Socioeconomic History   Marital status: Married    Spouse name: Not on file   Number of children: Not on file   Years of education: Not on file   Highest education level: Not on file  Occupational History   Occupation: Retired  Tobacco Use   Smoking status: Former    Current packs/day: 0.00    Average packs/day: 1 pack/day for 51.4 years (51.4 ttl pk-yrs)    Types: Cigarettes    Start date: 05/10/1952    Quit date: 10/18/2003    Years since quitting: 19.4   Smokeless tobacco: Never   Tobacco comments:    50 pack years; quit in 2005   Vaping Use   Vaping status: Never Used  Substance and Sexual Activity   Alcohol use: No    Alcohol/week: 0.0 standard drinks of alcohol   Drug use: No   Sexual  activity: Not on file  Other Topics Concern   Not on file  Social History Narrative   Retired; married; does not get regular exercise.    Social Determinants of Health   Financial Resource Strain: Not on file  Food Insecurity: Not on file  Transportation Needs: Not on file  Physical Activity: Not on file  Stress: Not on file  Social Connections: Not on file    Review of Systems  Constitutional: Negative.   Respiratory: Negative.    Cardiovascular: Negative.    Objective:  BP (!) 152/75   Pulse 72   Temp (!) 97.2 F (36.2 C)   Ht 5\' 9"  (1.753 m)   Wt 175 lb (79.4 kg)   SpO2 98%   BMI 25.84 kg/m      04/01/2023    1:35 PM 04/01/2023    1:11 PM 12/27/2022    1:43 PM  BP/Weight  Systolic BP 152 158 134  Diastolic BP 75 75 63  Wt. (Lbs)  175   BMI  25.84 kg/m2     Physical Exam Vitals and nursing note reviewed.  Constitutional:      General: He is not in acute distress.    Appearance: Normal appearance.  HENT:     Head: Normocephalic and atraumatic.  Eyes:  General:        Right eye: No discharge.        Left eye: No discharge.     Conjunctiva/sclera: Conjunctivae normal.  Cardiovascular:     Rate and Rhythm: Normal rate and regular rhythm.  Pulmonary:     Effort: Pulmonary effort is normal.     Breath sounds: Normal breath sounds. No wheezing, rhonchi or rales.  Abdominal:     General: There is no distension.     Palpations: Abdomen is soft.     Tenderness: There is no abdominal tenderness.     Comments: Umbilical hernia.  Neurological:     Mental Status: He is alert.  Psychiatric:        Mood and Affect: Mood normal.        Behavior: Behavior normal.     Lab Results  Component Value Date   WBC 8.6 10/02/2022   HGB 14.9 10/02/2022   HCT 45.0 10/02/2022   PLT 134 (L) 10/02/2022   GLUCOSE 111 (H) 10/02/2022   CHOL 100 10/02/2022   TRIG 75 10/02/2022   HDL 47 10/02/2022   LDLCALC 37 10/02/2022   ALT 11 10/02/2022   AST 13 10/02/2022    NA 139 10/02/2022   K 5.3 (H) 10/02/2022   CL 104 10/02/2022   CREATININE 1.10 10/02/2022   BUN 24 10/02/2022   CO2 23 10/02/2022   PSA 0.6 08/16/2014   INR 1.1 01/19/2007   HGBA1C 6.0 (H) 10/02/2022     Assessment & Plan:   Problem List Items Addressed This Visit       Cardiovascular and Mediastinum   Cerebrovascular disease   Relevant Medications   simvastatin (ZOCOR) 80 MG tablet   lisinopril (ZESTRIL) 20 MG tablet   clopidogrel (PLAVIX) 75 MG tablet   amLODipine (NORVASC) 2.5 MG tablet   PERIPHERAL VASCULAR DISEASE - Primary    Follows with vascular.      Relevant Medications   simvastatin (ZOCOR) 80 MG tablet   lisinopril (ZESTRIL) 20 MG tablet   amLODipine (NORVASC) 2.5 MG tablet   Hypertension    Home BPs at goal.  Continue current medications.      Relevant Medications   simvastatin (ZOCOR) 80 MG tablet   lisinopril (ZESTRIL) 20 MG tablet   amLODipine (NORVASC) 2.5 MG tablet     Endocrine   Controlled diabetes mellitus type 2 with complications (HCC)    A1c today to assess.  Continue Jardiance.      Relevant Medications   simvastatin (ZOCOR) 80 MG tablet   lisinopril (ZESTRIL) 20 MG tablet   empagliflozin (JARDIANCE) 10 MG TABS tablet   Other Relevant Orders   Hemoglobin A1c     Genitourinary   Stage 3a chronic kidney disease (HCC)   Relevant Orders   CBC   CMP14+EGFR   Microalbumin / creatinine urine ratio     Other   Insomnia    I have advised to use just the Restoril.      Relevant Medications   temazepam (RESTORIL) 30 MG capsule   ALPRAZolam (XANAX) 0.5 MG tablet   Hyperlipidemia    Lipid panel today to assess.  Continue statin.      Relevant Medications   simvastatin (ZOCOR) 80 MG tablet   lisinopril (ZESTRIL) 20 MG tablet   amLODipine (NORVASC) 2.5 MG tablet   Other Relevant Orders   Lipid panel    Meds ordered this encounter  Medications   temazepam (RESTORIL) 30 MG capsule  Sig: Take 1 capsule (30 mg total) by mouth  at bedtime as needed for sleep.    Dispense:  90 capsule    Refill:  1    NA   simvastatin (ZOCOR) 80 MG tablet    Sig: Take 1 tablet (80 mg total) by mouth daily.    Dispense:  90 tablet    Refill:  3    NA   lisinopril (ZESTRIL) 20 MG tablet    Sig: Take 1 tablet (20 mg total) by mouth daily.    Dispense:  90 tablet    Refill:  3    NA   empagliflozin (JARDIANCE) 10 MG TABS tablet    Sig: Take 1 tablet (10 mg total) by mouth daily before breakfast.    Dispense:  90 tablet    Refill:  3   clopidogrel (PLAVIX) 75 MG tablet    Sig: Take 1 tablet (75 mg total) by mouth daily.    Dispense:  90 tablet    Refill:  3   amLODipine (NORVASC) 2.5 MG tablet    Sig: Take 1 tablet (2.5 mg total) by mouth daily.    Dispense:  90 tablet    Refill:  3    NA   ALPRAZolam (XANAX) 0.5 MG tablet    Sig: Take 1 tablet (0.5 mg total) by mouth at bedtime as needed (Anxiety/sleep.).    Dispense:  30 tablet    Refill:  5    NA    Follow-up:  Return in about 6 months (around 09/29/2023).  Everlene Other DO [facility]

## 2023-04-02 LAB — CBC
Hematocrit: 41.6 % (ref 37.5–51.0)
Hemoglobin: 13.7 g/dL (ref 13.0–17.7)
MCH: 31.4 pg (ref 26.6–33.0)
MCHC: 32.9 g/dL (ref 31.5–35.7)
MCV: 95 fL (ref 79–97)
Platelets: 140 10*3/uL — ABNORMAL LOW (ref 150–450)
RBC: 4.36 x10E6/uL (ref 4.14–5.80)
RDW: 11.9 % (ref 11.6–15.4)
WBC: 7 10*3/uL (ref 3.4–10.8)

## 2023-04-02 LAB — CMP14+EGFR
ALT: 12 [IU]/L (ref 0–44)
AST: 15 [IU]/L (ref 0–40)
Albumin: 4.2 g/dL (ref 3.7–4.7)
Alkaline Phosphatase: 97 [IU]/L (ref 44–121)
BUN/Creatinine Ratio: 24 (ref 10–24)
BUN: 23 mg/dL (ref 8–27)
Bilirubin Total: 0.3 mg/dL (ref 0.0–1.2)
CO2: 25 mmol/L (ref 20–29)
Calcium: 9.2 mg/dL (ref 8.6–10.2)
Chloride: 104 mmol/L (ref 96–106)
Creatinine, Ser: 0.97 mg/dL (ref 0.76–1.27)
Globulin, Total: 2.3 g/dL (ref 1.5–4.5)
Glucose: 98 mg/dL (ref 70–99)
Potassium: 4.7 mmol/L (ref 3.5–5.2)
Sodium: 141 mmol/L (ref 134–144)
Total Protein: 6.5 g/dL (ref 6.0–8.5)
eGFR: 77 mL/min/{1.73_m2} (ref >59)

## 2023-04-02 LAB — HEMOGLOBIN A1C
Est. average glucose Bld gHb Est-mCnc: 120 mg/dL
Hgb A1c MFr Bld: 5.8 % — ABNORMAL HIGH (ref 4.8–5.6)

## 2023-04-02 LAB — LIPID PANEL
Chol/HDL Ratio: 2.4 ratio (ref 0.0–5.0)
Cholesterol, Total: 105 mg/dL (ref 100–199)
HDL: 44 mg/dL (ref >39)
LDL Chol Calc (NIH): 41 mg/dL (ref 0–99)
Triglycerides: 109 mg/dL (ref 0–149)
VLDL Cholesterol Cal: 20 mg/dL (ref 5–40)

## 2023-04-02 LAB — MICROALBUMIN / CREATININE URINE RATIO
Creatinine, Urine: 68.1 mg/dL
Microalb/Creat Ratio: 649 mg/g{creat} — ABNORMAL HIGH (ref 0–29)
Microalbumin, Urine: 441.8 ug/mL

## 2023-04-08 ENCOUNTER — Telehealth: Payer: Self-pay

## 2023-04-08 NOTE — Telephone Encounter (Signed)
Pt is returning Phone call about labs to [facility]

## 2023-04-08 NOTE — Progress Notes (Signed)
Tried calling patient regarding labs

## 2023-04-09 ENCOUNTER — Other Ambulatory Visit: Payer: Self-pay

## 2023-04-09 NOTE — Telephone Encounter (Signed)
Returned call, but no answer at that time

## 2023-04-09 NOTE — Telephone Encounter (Signed)
Copied from CRM [id number]. Topic: General - Call Back - No Documentation >> Apr 08, 2023  5:24 PM Suzette B wrote: Reason for CRM: patient was returning call however, he was unable to reach anyone and was concerned about what the call was for [phone] and best time to call is around 11 or 1130 am

## 2023-04-10 NOTE — Telephone Encounter (Signed)
See result note- Patient spoke with nurse 04/09/23

## 2023-04-23 ENCOUNTER — Other Ambulatory Visit: Payer: Self-pay

## 2023-04-23 DIAGNOSIS — N1831 Chronic kidney disease, stage 3a: Secondary | ICD-10-CM

## 2023-04-29 DIAGNOSIS — L600 Ingrowing nail: Secondary | ICD-10-CM | POA: Diagnosis not present

## 2023-04-29 DIAGNOSIS — M79674 Pain in right toe(s): Secondary | ICD-10-CM | POA: Diagnosis not present

## 2023-04-29 DIAGNOSIS — I739 Peripheral vascular disease, unspecified: Secondary | ICD-10-CM | POA: Diagnosis not present

## 2023-04-29 DIAGNOSIS — M79675 Pain in left toe(s): Secondary | ICD-10-CM | POA: Diagnosis not present

## 2023-04-29 DIAGNOSIS — B351 Tinea unguium: Secondary | ICD-10-CM | POA: Diagnosis not present

## 2023-05-27 ENCOUNTER — Other Ambulatory Visit: Payer: Self-pay | Admitting: Family Medicine

## 2023-05-30 DIAGNOSIS — L821 Other seborrheic keratosis: Secondary | ICD-10-CM | POA: Diagnosis not present

## 2023-05-30 DIAGNOSIS — L570 Actinic keratosis: Secondary | ICD-10-CM | POA: Diagnosis not present

## 2023-05-30 DIAGNOSIS — Z85828 Personal history of other malignant neoplasm of skin: Secondary | ICD-10-CM | POA: Diagnosis not present

## 2023-06-11 ENCOUNTER — Other Ambulatory Visit: Payer: Self-pay | Admitting: Family Medicine

## 2023-06-11 DIAGNOSIS — I10 Essential (primary) hypertension: Secondary | ICD-10-CM

## 2023-06-11 MED ORDER — LISINOPRIL 20 MG PO TABS: 20.0000 mg | ORAL_TABLET | Freq: Every day | ORAL | 3 refills | Status: AC

## 2023-06-11 NOTE — Telephone Encounter (Signed)
Copied from CRM [id number]. Topic: Clinical - Medication Refill >> Jun 11, 2023 10:08 AM Antony Haste wrote: Most Recent Primary Care Visit:  Provider: Tommie Sams  Department: [facility] MED  Visit Type: OFFICE VISIT  Date: 04/01/2023  Medication: lisinopril (ZESTRIL) 20 MG tablet  Has the patient contacted their pharmacy? Yes (Agent: If no, request that the patient contact the pharmacy for the refill. If patient does not wish to contact the pharmacy document the reason why and proceed with request.) (Agent: If yes, when and what did the pharmacy advise?)  Is this the correct pharmacy for this prescription? Yes If no, delete pharmacy and type the correct one.  This is the patient's preferred pharmacy:  [facility] - [place], [place] - [address] [place] [address] Phone: [phone] Fax: [phone]   Has the prescription been filled recently? No  Is the patient out of the medication? Yes  Has the patient been seen for an appointment in the last year OR does the patient have an upcoming appointment? Yes  Can we respond through MyChart? No  Agent: Please be advised that Rx refills may take up to 3 business days. We ask that you follow-up with your pharmacy.

## 2023-07-08 DIAGNOSIS — M79674 Pain in right toe(s): Secondary | ICD-10-CM | POA: Diagnosis not present

## 2023-07-08 DIAGNOSIS — M79675 Pain in left toe(s): Secondary | ICD-10-CM | POA: Diagnosis not present

## 2023-07-08 DIAGNOSIS — I739 Peripheral vascular disease, unspecified: Secondary | ICD-10-CM | POA: Diagnosis not present

## 2023-07-08 DIAGNOSIS — B351 Tinea unguium: Secondary | ICD-10-CM | POA: Diagnosis not present

## 2023-07-16 DIAGNOSIS — H16223 Keratoconjunctivitis sicca, not specified as Sjogren's, bilateral: Secondary | ICD-10-CM | POA: Diagnosis not present

## 2023-07-16 DIAGNOSIS — Z9841 Cataract extraction status, right eye: Secondary | ICD-10-CM | POA: Diagnosis not present

## 2023-07-16 DIAGNOSIS — E113292 Type 2 diabetes mellitus with mild nonproliferative diabetic retinopathy without macular edema, left eye: Secondary | ICD-10-CM | POA: Diagnosis not present

## 2023-07-16 LAB — HM DIABETES EYE EXAM

## 2023-07-18 ENCOUNTER — Other Ambulatory Visit: Payer: Self-pay | Admitting: Family Medicine

## 2023-08-12 DIAGNOSIS — I739 Peripheral vascular disease, unspecified: Secondary | ICD-10-CM | POA: Diagnosis not present

## 2023-08-12 DIAGNOSIS — N1831 Chronic kidney disease, stage 3a: Secondary | ICD-10-CM | POA: Diagnosis not present

## 2023-08-12 DIAGNOSIS — E1122 Type 2 diabetes mellitus with diabetic chronic kidney disease: Secondary | ICD-10-CM | POA: Diagnosis not present

## 2023-08-12 DIAGNOSIS — R809 Proteinuria, unspecified: Secondary | ICD-10-CM | POA: Diagnosis not present

## 2023-08-12 DIAGNOSIS — I129 Hypertensive chronic kidney disease with stage 1 through stage 4 chronic kidney disease, or unspecified chronic kidney disease: Secondary | ICD-10-CM | POA: Diagnosis not present

## 2023-08-12 LAB — LAB REPORT - SCANNED
Albumin, Urine POC: 186.3
Albumin/Creatinine Ratio, Urine, POC: 470
Creatinine, POC: 39.6 mg/dL
EGFR: 68

## 2023-09-30 ENCOUNTER — Ambulatory Visit (INDEPENDENT_AMBULATORY_CARE_PROVIDER_SITE_OTHER): Payer: BLUE CROSS/BLUE SHIELD | Admitting: Family Medicine

## 2023-09-30 ENCOUNTER — Other Ambulatory Visit: Payer: Self-pay | Admitting: Family Medicine

## 2023-09-30 ENCOUNTER — Encounter: Payer: Self-pay | Admitting: Family Medicine

## 2023-09-30 VITALS — BP 144/74 | HR 69 | Temp 98.6°F | Ht 69.0 in | Wt 174.0 lb

## 2023-09-30 DIAGNOSIS — E785 Hyperlipidemia, unspecified: Secondary | ICD-10-CM | POA: Diagnosis not present

## 2023-09-30 DIAGNOSIS — E1159 Type 2 diabetes mellitus with other circulatory complications: Secondary | ICD-10-CM | POA: Diagnosis not present

## 2023-09-30 DIAGNOSIS — M79675 Pain in left toe(s): Secondary | ICD-10-CM | POA: Diagnosis not present

## 2023-09-30 DIAGNOSIS — Z8673 Personal history of transient ischemic attack (TIA), and cerebral infarction without residual deficits: Secondary | ICD-10-CM | POA: Insufficient documentation

## 2023-09-30 DIAGNOSIS — I10 Essential (primary) hypertension: Secondary | ICD-10-CM | POA: Diagnosis not present

## 2023-09-30 DIAGNOSIS — F5101 Primary insomnia: Secondary | ICD-10-CM

## 2023-09-30 DIAGNOSIS — M2550 Pain in unspecified joint: Secondary | ICD-10-CM | POA: Insufficient documentation

## 2023-09-30 DIAGNOSIS — I739 Peripheral vascular disease, unspecified: Secondary | ICD-10-CM | POA: Diagnosis not present

## 2023-09-30 DIAGNOSIS — E118 Type 2 diabetes mellitus with unspecified complications: Secondary | ICD-10-CM

## 2023-09-30 DIAGNOSIS — B351 Tinea unguium: Secondary | ICD-10-CM | POA: Diagnosis not present

## 2023-09-30 DIAGNOSIS — I7142 Juxtarenal abdominal aortic aneurysm, without rupture: Secondary | ICD-10-CM

## 2023-09-30 DIAGNOSIS — E1169 Type 2 diabetes mellitus with other specified complication: Secondary | ICD-10-CM

## 2023-09-30 DIAGNOSIS — N1831 Chronic kidney disease, stage 3a: Secondary | ICD-10-CM | POA: Diagnosis not present

## 2023-09-30 DIAGNOSIS — M79674 Pain in right toe(s): Secondary | ICD-10-CM | POA: Diagnosis not present

## 2023-09-30 MED ORDER — TRAMADOL HCL 50 MG PO TABS: 50.0000 mg | ORAL_TABLET | Freq: Three times a day (TID) | ORAL | 0 refills | Status: DC | PRN

## 2023-09-30 NOTE — Assessment & Plan Note (Signed)
 Tramadol as needed

## 2023-09-30 NOTE — Assessment & Plan Note (Addendum)
 Fair control. Continue Norvasc  & Lisinopril .

## 2023-09-30 NOTE — Assessment & Plan Note (Signed)
 Needs to see Vascular.

## 2023-09-30 NOTE — Assessment & Plan Note (Signed)
 Lipid panel today. Has been at goal on Simvastatin . Continue.

## 2023-09-30 NOTE — Patient Instructions (Signed)
Labs today    Follow up in 3-6 months

## 2023-09-30 NOTE — Assessment & Plan Note (Signed)
 Has been stable. A1c today. Stopping Jardiance  due to intolerance.

## 2023-09-30 NOTE — Assessment & Plan Note (Signed)
 With significant proteinuria. Not tolerating Jardiance  and has not tolerated Kerendia in the past. Continue ACEi.

## 2023-09-30 NOTE — Progress Notes (Signed)
 Subjective:  Patient ID: [name], male    DOB: 1941/05/12  Age: 83 y.o. MRN: [id number]  CC:   Chief Complaint  Patient presents with   Follow-up    6 month f/u     HPI:  83 year old male with significant vascular disease (CAD s/p CABG, Hx of Stroke, PAD, AAA), DM-2, HLD, CKD 3 presents for follow up.  BP fairly well controlled here today. Compliant with Norvasc  and Lisinopril .  DM-2 has been stable. However, he is not taking Jardiance  as prescribed reporting that it causes dizziness. He states that he is taking it approximately every 3 days.  Reports ongoing joint pain - Knees and back. Uses Tylenol .  Needs labs today.   Patient Active Problem List   Diagnosis Date Noted   History of stroke 09/30/2023   Arthralgia 09/30/2023   Proteinuria 04/23/2022   Stage 3a chronic kidney disease (HCC) 04/23/2022   Controlled diabetes mellitus type 2 with complications (HCC) 06/27/2017   Insomnia 01/29/2013   CAD (coronary artery disease)    Cerebrovascular disease    Hypertension    Hyperlipidemia    Tobacco abuse, in remission    Abdominal aortic aneurysm 01/31/2010   PERIPHERAL VASCULAR DISEASE 01/31/2010    Social Hx   Social History   Socioeconomic History   Marital status: Married    Spouse name: Not on file   Number of children: Not on file   Years of education: Not on file   Highest education level: Not on file  Occupational History   Occupation: Retired  Tobacco Use   Smoking status: Former    Current packs/day: 0.00    Average packs/day: 1 pack/day for 51.4 years (51.4 ttl pk-yrs)    Types: Cigarettes    Start date: 05/10/1952    Quit date: 10/18/2003    Years since quitting: 19.9   Smokeless tobacco: Never   Tobacco comments:    50 pack years; quit in 2005   Vaping Use   Vaping status: Never Used  Substance and Sexual Activity   Alcohol use: No    Alcohol/week: 0.0 standard drinks of alcohol   Drug use: No   Sexual activity: Not on file   Other Topics Concern   Not on file  Social History Narrative   Retired; married; does not get regular exercise.    Social Drivers of Corporate investment banker Strain: Not on file  Food Insecurity: Not on file  Transportation Needs: Not on file  Physical Activity: Not on file  Stress: Not on file  Social Connections: Not on file    Review of Systems Per HPI  Objective:  BP (!) 144/74   Pulse 69   Temp 98.6 F (37 C)   Ht 5\' 9"  (1.753 m)   Wt 174 lb (78.9 kg)   SpO2 98%   BMI 25.70 kg/m      09/30/2023    1:32 PM 09/30/2023    1:04 PM 04/01/2023    1:35 PM  BP/Weight  Systolic BP 144 147 152  Diastolic BP 74 83 75  Wt. (Lbs)  174   BMI  25.7 kg/m2     Physical Exam Vitals and nursing note reviewed.  Constitutional:      General: He is not in acute distress.    Appearance: Normal appearance.  HENT:     Head: Normocephalic and atraumatic.     Ears:     Comments: Hard of hearing. Eyes:  General:        Right eye: No discharge.        Left eye: No discharge.     Conjunctiva/sclera: Conjunctivae normal.  Cardiovascular:     Rate and Rhythm: Normal rate and regular rhythm.  Pulmonary:     Effort: Pulmonary effort is normal.     Breath sounds: Normal breath sounds. No wheezing, rhonchi or rales.  Neurological:     Mental Status: He is alert.  Psychiatric:        Mood and Affect: Mood normal.        Behavior: Behavior normal.     Lab Results  Component Value Date   WBC 7.0 04/01/2023   HGB 13.7 04/01/2023   HCT 41.6 04/01/2023   PLT 140 (L) 04/01/2023   GLUCOSE 98 04/01/2023   CHOL 105 04/01/2023   TRIG 109 04/01/2023   HDL 44 04/01/2023   LDLCALC 41 04/01/2023   ALT 12 04/01/2023   AST 15 04/01/2023   NA 141 04/01/2023   K 4.7 04/01/2023   CL 104 04/01/2023   CREATININE 0.97 04/01/2023   BUN 23 04/01/2023   CO2 25 04/01/2023   PSA 0.6 08/16/2014   INR 1.1 01/19/2007   HGBA1C 5.8 (H) 04/01/2023     Assessment & Plan:  Controlled  type 2 diabetes mellitus with complication, without long-term current use of insulin  (HCC) Assessment & Plan: Has been stable. A1c today. Stopping Jardiance  due to intolerance.  Orders: -     CMP14+EGFR -     Hemoglobin A1c -     Microalbumin / creatinine urine ratio  Stage 3a chronic kidney disease (HCC) Assessment & Plan: With significant proteinuria. Not tolerating Jardiance  and has not tolerated Kerendia in the past. Continue ACEi.  Orders: -     CBC -     Microalbumin / creatinine urine ratio  Hyperlipidemia, unspecified hyperlipidemia type Assessment & Plan: Lipid panel today. Has been at goal on Simvastatin . Continue.  Orders: -     Lipid panel  History of stroke  Primary hypertension Assessment & Plan: Fair control. Continue Norvasc  & Lisinopril .    Arthralgia, unspecified joint Assessment & Plan: Tramadol  as needed.    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   Other orders -     traMADol  HCl; Take 1 tablet (50 mg total) by mouth every 8 (eight) hours as needed for severe pain (pain score 7-10).  Dispense: 15 tablet; Refill: 0    Follow-up:  3-6 months.   Kathleen Papa DO [facility]

## 2023-10-01 LAB — CMP14+EGFR
ALT: 13 IU/L (ref 0–44)
AST: 14 IU/L (ref 0–40)
Albumin: 4.6 g/dL (ref 3.7–4.7)
Alkaline Phosphatase: 88 IU/L (ref 44–121)
BUN/Creatinine Ratio: 21 (ref 10–24)
BUN: 22 mg/dL (ref 8–27)
Bilirubin Total: 0.4 mg/dL (ref 0.0–1.2)
CO2: 19 mmol/L — ABNORMAL LOW (ref 20–29)
Calcium: 9.5 mg/dL (ref 8.6–10.2)
Chloride: 101 mmol/L (ref 96–106)
Creatinine, Ser: 1.03 mg/dL (ref 0.76–1.27)
Globulin, Total: 2.3 g/dL (ref 1.5–4.5)
Glucose: 85 mg/dL (ref 70–99)
Potassium: 5.1 mmol/L (ref 3.5–5.2)
Sodium: 137 mmol/L (ref 134–144)
Total Protein: 6.9 g/dL (ref 6.0–8.5)
eGFR: 72 mL/min/{1.73_m2} (ref >59)

## 2023-10-01 LAB — CBC
Hematocrit: 44.6 % (ref 37.5–51.0)
Hemoglobin: 14.8 g/dL (ref 13.0–17.7)
MCH: 31.8 pg (ref 26.6–33.0)
MCHC: 33.2 g/dL (ref 31.5–35.7)
MCV: 96 fL (ref 79–97)
Platelets: 139 10*3/uL — ABNORMAL LOW (ref 150–450)
RBC: 4.66 x10E6/uL (ref 4.14–5.80)
RDW: 12.2 % (ref 11.6–15.4)
WBC: 8.8 10*3/uL (ref 3.4–10.8)

## 2023-10-01 LAB — LIPID PANEL
Chol/HDL Ratio: 2.6 ratio (ref 0.0–5.0)
Cholesterol, Total: 116 mg/dL (ref 100–199)
HDL: 45 mg/dL (ref >39)
LDL Chol Calc (NIH): 52 mg/dL (ref 0–99)
Triglycerides: 102 mg/dL (ref 0–149)
VLDL Cholesterol Cal: 19 mg/dL (ref 5–40)

## 2023-10-01 LAB — MICROALBUMIN / CREATININE URINE RATIO
Creatinine, Urine: 39.5 mg/dL
Microalb/Creat Ratio: 999 mg/g{creat} — ABNORMAL HIGH (ref 0–29)
Microalbumin, Urine: 394.7 ug/mL

## 2023-10-01 LAB — HEMOGLOBIN A1C
Est. average glucose Bld gHb Est-mCnc: 123 mg/dL
Hgb A1c MFr Bld: 5.9 % — ABNORMAL HIGH (ref 4.8–5.6)

## 2023-10-03 ENCOUNTER — Other Ambulatory Visit: Payer: Self-pay

## 2023-10-03 DIAGNOSIS — I729 Aneurysm of unspecified site: Secondary | ICD-10-CM

## 2023-10-07 ENCOUNTER — Ambulatory Visit: Payer: Self-pay | Admitting: Family Medicine

## 2023-10-08 ENCOUNTER — Telehealth: Payer: Self-pay

## 2023-10-08 NOTE — Telephone Encounter (Signed)
 Dr. Princella Brooklyn patient please

## 2023-10-08 NOTE — Telephone Encounter (Signed)
 Copied from CRM [id number]. Topic: General - Call Back - No Documentation >> Oct 08, 2023  2:35 PM Emylou G wrote: Patient returned call .[name] Is okay with temazepam  (RESTORIL ) 30 MG capsule to be filled if he had to choose.. okay to proceed with that

## 2023-10-09 ENCOUNTER — Other Ambulatory Visit: Payer: Self-pay | Admitting: Family Medicine

## 2023-10-09 DIAGNOSIS — F5101 Primary insomnia: Secondary | ICD-10-CM

## 2023-10-09 MED ORDER — TEMAZEPAM 30 MG PO CAPS: 30.0000 mg | ORAL_CAPSULE | Freq: Every evening | ORAL | 1 refills | Status: DC | PRN

## 2023-10-09 NOTE — Telephone Encounter (Signed)
-  pt called back and left a message to inform the following in regards to sleep medication- says Is okay with temazepam  (RESTORIL ) 30 MG capsule to be filled if he had to choose.. okay to proceed with that  Copied from CRM [id number]. Topic: General - Call Back - No Documentation >> Oct 04, 2023 12:14 PM Rachelle R wrote: Reason for CRM: Patient states received a call today with a voicemail just asking him to return the call.   Patient is requesting a call back at [phone]. Says can leave a detailed message if he does not answer. >> Oct 08, 2023  2:35 PM Emylou G wrote: Patient returned call .[name] Is okay with temazepam  (RESTORIL ) 30 MG capsule to be filled if he had to choose.. okay to proceed with that

## 2023-10-10 ENCOUNTER — Other Ambulatory Visit: Payer: Self-pay

## 2023-10-10 DIAGNOSIS — R809 Proteinuria, unspecified: Secondary | ICD-10-CM

## 2023-11-27 DIAGNOSIS — L814 Other melanin hyperpigmentation: Secondary | ICD-10-CM | POA: Diagnosis not present

## 2023-11-27 DIAGNOSIS — L821 Other seborrheic keratosis: Secondary | ICD-10-CM | POA: Diagnosis not present

## 2023-11-27 DIAGNOSIS — L570 Actinic keratosis: Secondary | ICD-10-CM | POA: Diagnosis not present

## 2023-11-27 DIAGNOSIS — Z85828 Personal history of other malignant neoplasm of skin: Secondary | ICD-10-CM | POA: Diagnosis not present

## 2023-12-16 DIAGNOSIS — L600 Ingrowing nail: Secondary | ICD-10-CM | POA: Diagnosis not present

## 2023-12-16 DIAGNOSIS — B351 Tinea unguium: Secondary | ICD-10-CM | POA: Diagnosis not present

## 2023-12-16 DIAGNOSIS — M79674 Pain in right toe(s): Secondary | ICD-10-CM | POA: Diagnosis not present

## 2023-12-16 DIAGNOSIS — M79675 Pain in left toe(s): Secondary | ICD-10-CM | POA: Diagnosis not present

## 2023-12-16 DIAGNOSIS — I739 Peripheral vascular disease, unspecified: Secondary | ICD-10-CM | POA: Diagnosis not present

## 2023-12-31 ENCOUNTER — Ambulatory Visit: Admitting: Family Medicine

## 2023-12-31 ENCOUNTER — Encounter: Payer: Self-pay | Admitting: Family Medicine

## 2023-12-31 VITALS — BP 148/74 | HR 66 | Temp 97.5°F | Ht 69.0 in | Wt 169.0 lb

## 2023-12-31 DIAGNOSIS — I679 Cerebrovascular disease, unspecified: Secondary | ICD-10-CM | POA: Diagnosis not present

## 2023-12-31 DIAGNOSIS — F5101 Primary insomnia: Secondary | ICD-10-CM

## 2023-12-31 DIAGNOSIS — I10 Essential (primary) hypertension: Secondary | ICD-10-CM

## 2023-12-31 DIAGNOSIS — E785 Hyperlipidemia, unspecified: Secondary | ICD-10-CM

## 2023-12-31 DIAGNOSIS — I739 Peripheral vascular disease, unspecified: Secondary | ICD-10-CM | POA: Diagnosis not present

## 2023-12-31 MED ORDER — CLOPIDOGREL BISULFATE 75 MG PO TABS
75.0000 mg | ORAL_TABLET | Freq: Every day | ORAL | 3 refills | Status: AC
Start: 2023-12-31 — End: ?

## 2023-12-31 MED ORDER — SIMVASTATIN 80 MG PO TABS
80.0000 mg | ORAL_TABLET | Freq: Every day | ORAL | 3 refills | Status: AC
Start: 2023-12-31 — End: ?

## 2023-12-31 MED ORDER — AMLODIPINE BESYLATE 2.5 MG PO TABS: 2.5000 mg | ORAL_TABLET | Freq: Every day | ORAL | 3 refills | Status: AC

## 2023-12-31 NOTE — Assessment & Plan Note (Signed)
 Patient has an extensive vascular history. Continuing Plavix .

## 2023-12-31 NOTE — Assessment & Plan Note (Signed)
 Lipids at goal on Zocor .

## 2023-12-31 NOTE — Assessment & Plan Note (Signed)
Fair control. Continue current medications. 

## 2023-12-31 NOTE — Progress Notes (Signed)
 Subjective:  Patient ID: [name], male    DOB: 12-07-1940  Age: 83 y.o. MRN: [id number]  CC:   Chief Complaint  Patient presents with   3 month folllow up    easy bruising     HPI:  83 year old male with the below mentioned medical problems presents for follow up.  BP mildly elevated here today. However, fair control given advanced age.  Reports BP is well controlled at home. Complaint with Norvasc  and Lisinopril .   Reports easy bruising in the setting of Plavix . He wants to discontinue. Will discuss today. She has an extensive vascular history.  Following with Nephrology. Has not tolerated Jardiance  or Kerendia. Remains on ACEi.  Insomnia fairly well controlled on Temazepam .   Diabetes is well controlled. He is currently checking his blood sugars 3 times a day. He would like to decrease this.  Lipids at goal on Zocor .  Patient Active Problem List   Diagnosis Date Noted   History of stroke 09/30/2023   Arthralgia 09/30/2023   Proteinuria 04/23/2022   Stage 3a chronic kidney disease (HCC) 04/23/2022   Controlled diabetes mellitus type 2 with complications (HCC) 06/27/2017   Insomnia 01/29/2013   CAD (coronary artery disease)    Cerebrovascular disease    Hypertension    Hyperlipidemia    Tobacco abuse, in remission    Abdominal aortic aneurysm 01/31/2010   PERIPHERAL VASCULAR DISEASE 01/31/2010    Social Hx   Social History   Socioeconomic History   Marital status: Married    Spouse name: Not on file   Number of children: Not on file   Years of education: Not on file   Highest education level: Not on file  Occupational History   Occupation: Retired  Tobacco Use   Smoking status: Former    Current packs/day: 0.00    Average packs/day: 1 pack/day for 51.4 years (51.4 ttl pk-yrs)    Types: Cigarettes    Start date: 05/10/1952    Quit date: 10/18/2003    Years since quitting: 20.2   Smokeless tobacco: Never   Tobacco comments:    50 pack years; quit  in 2005   Vaping Use   Vaping status: Never Used  Substance and Sexual Activity   Alcohol use: No    Alcohol/week: 0.0 standard drinks of alcohol   Drug use: No   Sexual activity: Not on file  Other Topics Concern   Not on file  Social History Narrative   Retired; married; does not get regular exercise.    Social Drivers of Corporate investment banker Strain: Not on file  Food Insecurity: Not on file  Transportation Needs: Not on file  Physical Activity: Not on file  Stress: Not on file  Social Connections: Not on file    Review of Systems Per HPI  Objective:  BP (!) 148/74   Pulse 66   Temp (!) 97.5 F (36.4 C)   Ht 5' 9 (1.753 m)   Wt 169 lb (76.7 kg)   SpO2 97%   BMI 24.96 kg/m      12/31/2023    1:28 PM 09/30/2023    1:32 PM 09/30/2023    1:04 PM  BP/Weight  Systolic BP 148 144 147  Diastolic BP 74 74 83  Wt. (Lbs) 169  174  BMI 24.96 kg/m2  25.7 kg/m2    Physical Exam Vitals and nursing note reviewed.  Constitutional:      General: He is not in  acute distress.    Appearance: Normal appearance.  HENT:     Head: Normocephalic and atraumatic.  Eyes:     General:        Right eye: No discharge.        Left eye: No discharge.     Conjunctiva/sclera: Conjunctivae normal.  Cardiovascular:     Rate and Rhythm: Normal rate and regular rhythm.  Pulmonary:     Effort: Pulmonary effort is normal.     Breath sounds: Normal breath sounds.  Abdominal:     General: There is no distension.     Palpations: Abdomen is soft.     Comments: Umbilical hernia noted.  Neurological:     Mental Status: He is alert.     Lab Results  Component Value Date   WBC 8.8 09/30/2023   HGB 14.8 09/30/2023   HCT 44.6 09/30/2023   PLT 139 (L) 09/30/2023   GLUCOSE 85 09/30/2023   CHOL 116 09/30/2023   TRIG 102 09/30/2023   HDL 45 09/30/2023   LDLCALC 52 09/30/2023   ALT 13 09/30/2023   AST 14 09/30/2023   NA 137 09/30/2023   K 5.1 09/30/2023   CL 101 09/30/2023    CREATININE 1.03 09/30/2023   BUN 22 09/30/2023   CO2 19 (L) 09/30/2023   PSA 0.6 08/16/2014   INR 1.1 01/19/2007   HGBA1C 5.9 (H) 09/30/2023     Assessment & Plan:  PERIPHERAL VASCULAR DISEASE Assessment & Plan: Patient has an extensive vascular history. Continuing Plavix .   Hyperlipidemia, unspecified hyperlipidemia type Assessment & Plan: Lipids at goal on Zocor .  Orders: -     Simvastatin ; Take 1 tablet (80 mg total) by mouth daily.  Dispense: 90 tablet; Refill: 3  Cerebrovascular disease -     Clopidogrel  Bisulfate; Take 1 tablet (75 mg total) by mouth daily.  Dispense: 90 tablet; Refill: 3  Primary hypertension Assessment & Plan: Fair control. Continue current medications.  Orders: -     amLODIPine  Besylate; Take 1 tablet (2.5 mg total) by mouth daily.  Dispense: 90 tablet; Refill: 3  Primary insomnia Assessment & Plan: Stable.     Follow-up:  6 months  Jaymes Revels Bluford DO [facility]

## 2023-12-31 NOTE — Patient Instructions (Signed)
 Stop Amlodipine  of BP remains low.  Continue your medications.  No need to check blood sugar often.  Follow up in 6 months.

## 2023-12-31 NOTE — Assessment & Plan Note (Signed)
 Stable

## 2024-01-24 ENCOUNTER — Ambulatory Visit: Admitting: Orthopedic Surgery

## 2024-01-24 DIAGNOSIS — M25562 Pain in left knee: Secondary | ICD-10-CM | POA: Diagnosis not present

## 2024-01-24 DIAGNOSIS — G8929 Other chronic pain: Secondary | ICD-10-CM

## 2024-01-24 DIAGNOSIS — M1712 Unilateral primary osteoarthritis, left knee: Secondary | ICD-10-CM

## 2024-01-24 NOTE — Progress Notes (Signed)
     Chief Complaint  Patient presents with   Knee Pain    Left knee pain for 6 months    Encounter Diagnoses  Name Primary?   Primary osteoarthritis of left knee Yes   Chronic pain of left knee     Procedure note left knee injection   verbal consent was obtained to inject left knee joint  Timeout was completed to confirm the site of injection  The medications used were depomedrol 40 mg and 1% lidocaine  3 cc Anesthesia was provided by ethyl chloride and the skin was prepped with alcohol.  After cleaning the skin with alcohol a 20-gauge needle was used to inject the left knee joint. There were no complications. A sterile bandage was applied.

## 2024-02-03 DIAGNOSIS — N1831 Chronic kidney disease, stage 3a: Secondary | ICD-10-CM | POA: Diagnosis not present

## 2024-02-03 DIAGNOSIS — E1122 Type 2 diabetes mellitus with diabetic chronic kidney disease: Secondary | ICD-10-CM | POA: Diagnosis not present

## 2024-02-03 DIAGNOSIS — I129 Hypertensive chronic kidney disease with stage 1 through stage 4 chronic kidney disease, or unspecified chronic kidney disease: Secondary | ICD-10-CM | POA: Diagnosis not present

## 2024-02-03 DIAGNOSIS — R809 Proteinuria, unspecified: Secondary | ICD-10-CM | POA: Diagnosis not present

## 2024-02-03 DIAGNOSIS — E785 Hyperlipidemia, unspecified: Secondary | ICD-10-CM | POA: Diagnosis not present

## 2024-02-03 DIAGNOSIS — I739 Peripheral vascular disease, unspecified: Secondary | ICD-10-CM | POA: Diagnosis not present

## 2024-02-06 DIAGNOSIS — Z23 Encounter for immunization: Secondary | ICD-10-CM | POA: Diagnosis not present

## 2024-02-24 DIAGNOSIS — L851 Acquired keratosis [keratoderma] palmaris et plantaris: Secondary | ICD-10-CM | POA: Diagnosis not present

## 2024-02-24 DIAGNOSIS — L600 Ingrowing nail: Secondary | ICD-10-CM | POA: Diagnosis not present

## 2024-02-24 DIAGNOSIS — I739 Peripheral vascular disease, unspecified: Secondary | ICD-10-CM | POA: Diagnosis not present

## 2024-02-24 DIAGNOSIS — M79675 Pain in left toe(s): Secondary | ICD-10-CM | POA: Diagnosis not present

## 2024-02-24 DIAGNOSIS — B351 Tinea unguium: Secondary | ICD-10-CM | POA: Diagnosis not present

## 2024-02-24 DIAGNOSIS — M79674 Pain in right toe(s): Secondary | ICD-10-CM | POA: Diagnosis not present

## 2024-04-15 ENCOUNTER — Other Ambulatory Visit: Payer: Self-pay | Admitting: Family Medicine

## 2024-04-15 DIAGNOSIS — F5101 Primary insomnia: Secondary | ICD-10-CM

## 2024-05-06 ENCOUNTER — Other Ambulatory Visit: Payer: Self-pay | Admitting: Family Medicine

## 2024-06-18 ENCOUNTER — Other Ambulatory Visit: Payer: Self-pay | Admitting: Family Medicine

## 2024-07-02 ENCOUNTER — Ambulatory Visit: Admitting: Family Medicine
